# Patient Record
Sex: Female | Born: 1990 | Race: Black or African American | Hispanic: No | Marital: Single | Smoking: Never smoker
Health system: Southern US, Community
[De-identification: ages and names within clinical notes are randomized; demographics above are authoritative.]

## PROBLEM LIST (undated history)

## (undated) ENCOUNTER — Inpatient Hospital Stay (HOSPITAL_COMMUNITY): Payer: Self-pay

## (undated) DIAGNOSIS — R51 Headache: Secondary | ICD-10-CM

## (undated) DIAGNOSIS — R519 Headache, unspecified: Secondary | ICD-10-CM

## (undated) HISTORY — DX: Headache, unspecified: R51.9

## (undated) HISTORY — DX: Headache: R51

## (undated) HISTORY — PX: NO PAST SURGERIES: SHX2092

---
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
## 2008-05-01 ENCOUNTER — Emergency Department (HOSPITAL_COMMUNITY): Admission: EM | Admit: 2008-05-01 | Discharge: 2008-05-01 | Payer: Self-pay | Admitting: Emergency Medicine

## 2008-08-21 ENCOUNTER — Emergency Department (HOSPITAL_COMMUNITY): Admission: EM | Admit: 2008-08-21 | Discharge: 2008-08-22 | Payer: Self-pay | Admitting: Emergency Medicine

## 2008-08-27 ENCOUNTER — Emergency Department (HOSPITAL_COMMUNITY): Admission: EM | Admit: 2008-08-27 | Discharge: 2008-08-27 | Payer: Self-pay | Admitting: Emergency Medicine

## 2010-05-16 LAB — BASIC METABOLIC PANEL
BUN: 4 mg/dL — ABNORMAL LOW (ref 6–23)
Chloride: 104 mEq/L (ref 96–112)
Creatinine, Ser: 0.93 mg/dL (ref 0.4–1.2)
GFR calc Af Amer: >60 mL/min (ref >60)
Glucose, Bld: 96 mg/dL (ref 70–99)
Potassium: 3.5 mEq/L (ref 3.5–5.1)
Sodium: 134 mEq/L — ABNORMAL LOW (ref 135–145)

## 2010-05-16 LAB — DIFFERENTIAL
Basophils Relative: 0 % (ref 0–1)
Lymphocytes Relative: 5 % — ABNORMAL LOW (ref 12–46)
Monocytes Absolute: 0.9 10*3/uL (ref 0.1–1.0)
Monocytes Relative: 7 % (ref 3–12)
Neutrophils Relative %: 89 % — ABNORMAL HIGH (ref 43–77)

## 2010-05-16 LAB — WET PREP, GENITAL
Trich, Wet Prep: NONE SEEN
Yeast Wet Prep HPF POC: NONE SEEN

## 2010-05-16 LAB — URINALYSIS, ROUTINE W REFLEX MICROSCOPIC
Bilirubin Urine: NEGATIVE
Glucose, UA: NEGATIVE mg/dL
Nitrite: NEGATIVE
Specific Gravity, Urine: 1.021 (ref 1.005–1.030)
pH: 7 (ref 5.0–8.0)

## 2010-05-16 LAB — RAPID STREP SCREEN (MED CTR MEBANE ONLY): Streptococcus, Group A Screen (Direct): NEGATIVE

## 2010-05-16 LAB — CBC
HCT: 39.5 % (ref 36.0–46.0)
Hemoglobin: 13.6 g/dL (ref 12.0–15.0)
RDW: 12.6 % (ref 11.5–15.5)
WBC: 13.1 10*3/uL — ABNORMAL HIGH (ref 4.0–10.5)

## 2010-05-16 LAB — GC/CHLAMYDIA PROBE AMP, GENITAL
Chlamydia, DNA Probe: POSITIVE — AB
GC Probe Amp, Genital: POSITIVE — AB

## 2011-06-17 ENCOUNTER — Encounter (HOSPITAL_COMMUNITY): Payer: Self-pay | Admitting: *Deleted

## 2011-06-17 ENCOUNTER — Emergency Department (HOSPITAL_COMMUNITY)
Admission: EM | Admit: 2011-06-17 | Discharge: 2011-06-18 | Disposition: A | Discharge status: Home / Self Care | Payer: Medicaid Other | Attending: Emergency Medicine | Admitting: Emergency Medicine

## 2011-06-17 DIAGNOSIS — R300 Dysuria: Secondary | ICD-10-CM | POA: Insufficient documentation

## 2011-06-17 DIAGNOSIS — R109 Unspecified abdominal pain: Secondary | ICD-10-CM | POA: Insufficient documentation

## 2011-06-17 DIAGNOSIS — N760 Acute vaginitis: Secondary | ICD-10-CM | POA: Insufficient documentation

## 2011-06-17 DIAGNOSIS — A499 Bacterial infection, unspecified: Secondary | ICD-10-CM | POA: Insufficient documentation

## 2011-06-17 DIAGNOSIS — B9689 Other specified bacterial agents as the cause of diseases classified elsewhere: Secondary | ICD-10-CM | POA: Insufficient documentation

## 2011-06-17 NOTE — ED Notes (Signed)
C/o lower mid abd/pelvic cramping, also dysuria, onset 2d ago, (denies: back pain, fever, vd or bleeding), also reports vaginal itching and nausea. Alert, NAD, calm, interactive.

## 2011-06-18 LAB — CBC
HCT: 36.3 % (ref 36.0–46.0)
MCH: 32.2 pg (ref 26.0–34.0)
MCV: 91.2 fL (ref 78.0–100.0)
Platelets: 247 10*3/uL (ref 150–400)
RDW: 12.4 % (ref 11.5–15.5)
WBC: 8 10*3/uL (ref 4.0–10.5)

## 2011-06-18 LAB — DIFFERENTIAL
Basophils Relative: 1 % (ref 0–1)
Eosinophils Absolute: 0.1 10*3/uL (ref 0.0–0.7)
Eosinophils Relative: 1 % (ref 0–5)
Monocytes Absolute: 0.5 10*3/uL (ref 0.1–1.0)
Neutrophils Relative %: 46 % (ref 43–77)

## 2011-06-18 LAB — URINALYSIS, ROUTINE W REFLEX MICROSCOPIC
Bilirubin Urine: NEGATIVE
Nitrite: NEGATIVE

## 2011-06-18 LAB — POCT I-STAT, CHEM 8
Creatinine, Ser: 1 mg/dL (ref 0.50–1.10)
Potassium: 3.5 mEq/L (ref 3.5–5.1)
TCO2: 23 mmol/L (ref 0–100)

## 2011-06-18 LAB — WET PREP, GENITAL: Trich, Wet Prep: NONE SEEN

## 2011-06-18 MED ORDER — METRONIDAZOLE 500 MG PO TABS: 500.0000 mg | ORAL_TABLET | Freq: Two times a day (BID) | ORAL | Status: AC

## 2011-06-18 NOTE — Discharge Instructions (Signed)
Bacterial Vaginosis Bacterial vaginosis (BV) is a vaginal infection where the normal balance of bacteria in the vagina is disrupted. The normal balance is then replaced by an overgrowth of certain bacteria. There are several different kinds of bacteria that can cause BV. BV is the most common vaginal infection in women of childbearing age. CAUSES   The cause of BV is not fully understood. BV develops when there is an increase or imbalance of harmful bacteria.   Some activities or behaviors can upset the normal balance of bacteria in the vagina and put women at increased risk including:   Having a new sex partner or multiple sex partners.   Douching.   Using an intrauterine device (IUD) for contraception.   It is not clear what role sexual activity plays in the development of BV. However, women that have never had sexual intercourse are rarely infected with BV.  Women do not get BV from toilet seats, bedding, swimming pools or from touching objects around them.  SYMPTOMS   Grey vaginal discharge.   A fish-like odor with discharge, especially after sexual intercourse.   Itching or burning of the vagina and vulva.   Burning or pain with urination.   Some women have no signs or symptoms at all.  DIAGNOSIS  Your caregiver must examine the vagina for signs of BV. Your caregiver will perform lab tests and look at the sample of vaginal fluid through a microscope. They will look for bacteria and abnormal cells (clue cells), a pH test higher than 4.5, and a positive amine test all associated with BV.  RISKS AND COMPLICATIONS   Pelvic inflammatory disease (PID).   Infections following gynecology surgery.   Developing HIV.   Developing herpes virus.  TREATMENT  Sometimes BV will clear up without treatment. However, all women with symptoms of BV should be treated to avoid complications, especially if gynecology surgery is planned. Female partners generally do not need to be treated. However,  BV may spread between female sex partners so treatment is helpful in preventing a recurrence of BV.   BV may be treated with antibiotics. The antibiotics come in either pill or vaginal cream forms. Either can be used with nonpregnant or pregnant women, but the recommended dosages differ. These antibiotics are not harmful to the baby.   BV can recur after treatment. If this happens, a second round of antibiotics will often be prescribed.   Treatment is important for pregnant women. If not treated, BV can cause a premature delivery, especially for a pregnant woman who had a premature birth in the past. All pregnant women who have symptoms of BV should be checked and treated.   For chronic reoccurrence of BV, treatment with a type of prescribed gel vaginally twice a week is helpful.  HOME CARE INSTRUCTIONS   Finish all medication as directed by your caregiver.   Do not have sex until treatment is completed.   Tell your sexual partner that you have a vaginal infection. They should see their caregiver and be treated if they have problems, such as a mild rash or itching.   Practice safe sex. Use condoms. Only have 1 sex partner.  PREVENTION  Basic prevention steps can help reduce the risk of upsetting the natural balance of bacteria in the vagina and developing BV:  Do not have sexual intercourse (be abstinent).   Do not douche.   Use all of the medicine prescribed for treatment of BV, even if the signs and symptoms go away.     Tell your sex partner if you have BV. That way, they can be treated, if needed, to prevent reoccurrence.  SEEK MEDICAL CARE IF:   Your symptoms are not improving after 3 days of treatment.   You have increased discharge, pain, or fever.  MAKE SURE YOU:   Understand these instructions.   Will watch your condition.   Will get help right away if you are not doing well or get worse.  FOR MORE INFORMATION  Division of STD Prevention (DSTDP), Centers for Disease  Control and Prevention: www.cdc.gov/std American Social Health Association (ASHA): www.ashastd.org  Document Released: 01/24/2005 Document Revised: 01/13/2011 Document Reviewed: 07/17/2008 ExitCare Patient Information 2012 [employer]. 

## 2011-06-18 NOTE — ED Provider Notes (Signed)
History     CSN: [id number]  Arrival date & time 06/17/11  2247   First MD Initiated Contact with Patient 06/18/11 0131      Chief Complaint  Patient presents with  . Abdominal Cramping    (Consider location/radiation/quality/duration/timing/severity/associated sxs/prior treatment) Patient is a 21 y.o. female presenting with cramps and dysuria.  Abdominal Cramping The primary symptoms of the illness include dysuria and vaginal discharge. The primary symptoms of the illness do not include vaginal bleeding. The current episode started more than 2 days ago. The onset of the illness was gradual. The problem has not changed since onset. The dysuria is not associated with discharge or hematuria.  The vaginal discharge is associated with dysuria.   Associated with: none. The patient has not had a change in bowel habit. Risk factors: none. Symptoms associated with the illness do not include hematuria.  Dysuria  This is a new problem. The current episode started more than 2 days ago. The problem occurs every urination. The problem has not changed since onset.The quality of the pain is described as burning. There has been no fever. She is sexually active. There is no history of pyelonephritis. Pertinent negatives include no discharge, no hematuria and no flank pain. She has tried nothing for the symptoms. Her past medical history does not include catheterization.    History reviewed. No pertinent past medical history.  History reviewed. No pertinent past surgical history.  Family History  Problem Relation Age of Onset  . Diabetes Other     History  Substance Use Topics  . Smoking status: Never Smoker   . Smokeless tobacco: Not on file  . Alcohol Use: No    OB History    Grav Para Term Preterm Abortions TAB SAB Ect Mult Living                  Review of Systems  Genitourinary: Positive for dysuria and vaginal discharge. Negative for hematuria, flank pain and vaginal bleeding.    All other systems reviewed and are negative.    Allergies  Review of patient's allergies indicates no known allergies.  Home Medications  No current outpatient prescriptions on file.  BP 118/69  Pulse 78  Temp(Src) 98.6 F (37 C) (Oral)  Resp 18  SpO2 100%  LMP 02/03/2011  Physical Exam  Constitutional: She is oriented to person, place, and time. She appears well-developed and well-nourished.  HENT:  Head: Normocephalic and atraumatic.  Mouth/Throat: Oropharynx is clear and moist.  Eyes: Conjunctivae are normal. Pupils are equal, round, and reactive to light.  Neck: Normal range of motion. Neck supple.  Cardiovascular: Normal rate and regular rhythm.   Pulmonary/Chest: Effort normal and breath sounds normal. She has no wheezes. She has no rales.  Abdominal: Soft. Bowel sounds are normal. There is no tenderness. There is no rebound and no guarding.  Genitourinary: Vaginal discharge found.       Chaperone present  Musculoskeletal: Normal range of motion.  Neurological: She is alert and oriented to person, place, and time.  Skin: Skin is warm and dry.  Psychiatric: She has a normal mood and affect.    ED Course  Procedures (including critical care time)   Labs Reviewed  CBC  DIFFERENTIAL  POCT I-STAT, CHEM 8  POCT PREGNANCY, URINE  URINALYSIS, ROUTINE W REFLEX MICROSCOPIC  WET PREP, GENITAL  GC/CHLAMYDIA PROBE AMP, GENITAL   No results found.   No diagnosis found.    MDM  Will treat for BV  follow up with your family doctor for ongoing symptoms.  Patient verbalizes understanding and agrees to follow up       Jerran Tappan Smitty Cords, MD 06/18/11 [phone]

## 2011-06-20 LAB — GC/CHLAMYDIA PROBE AMP, GENITAL: Chlamydia, DNA Probe: NEGATIVE

## 2012-01-06 LAB — OB RESULTS CONSOLE HEPATITIS B SURFACE ANTIGEN: Hepatitis B Surface Ag: NEGATIVE

## 2012-01-06 LAB — OB RESULTS CONSOLE RUBELLA ANTIBODY, IGM: Rubella: IMMUNE

## 2012-01-06 LAB — OB RESULTS CONSOLE HGB/HCT, BLOOD: Hemoglobin: 12.9 g/dL

## 2012-01-06 LAB — OB RESULTS CONSOLE ANTIBODY SCREEN: Antibody Screen: NEGATIVE

## 2012-01-06 LAB — OB RESULTS CONSOLE HIV ANTIBODY (ROUTINE TESTING): HIV: NONREACTIVE

## 2012-01-06 LAB — OB RESULTS CONSOLE PLATELET COUNT: Platelets: 327 10*3/uL

## 2012-01-06 LAB — SICKLE CELL SCREEN: Sickle Cell Screen: NEGATIVE

## 2012-01-06 LAB — OB RESULTS CONSOLE GC/CHLAMYDIA: Gonorrhea: NEGATIVE

## 2012-02-08 NOTE — L&D Delivery Note (Signed)
Delivery Note At 12:43 AM a viable female was delivered via Vaginal, Spontaneous Delivery (Presentation: ; Occiput Anterior).  APGAR: 6, 4 .   Placenta status: Intact, Spontaneous.  Cord: 3 vessels with the following complications: none .  Episode of secondary apnea in setting of recent IM/IV narcotic analgesia.  Code Apgar called.  Infant stable on blow by O2.  Anesthesia:  None Episiotomy: None Lacerations: None Suture Repair: n/a Est. Blood Loss (mL): 100 ml  Mom to postpartum.  Baby to nursery-stable.  [name],Brittney Cybulski A 07/12/2012, 12:58 AM

## 2012-04-11 LAB — OB RESULTS CONSOLE RPR: RPR: NONREACTIVE

## 2012-04-22 ENCOUNTER — Encounter: Payer: Self-pay | Admitting: *Deleted

## 2012-04-25 ENCOUNTER — Encounter: Payer: Self-pay | Admitting: Obstetrics

## 2012-04-25 ENCOUNTER — Ambulatory Visit (INDEPENDENT_AMBULATORY_CARE_PROVIDER_SITE_OTHER): Payer: Medicaid Other | Admitting: Obstetrics

## 2012-04-25 VITALS — BP 120/74 | Temp 98.3°F | Wt 157.6 lb

## 2012-04-25 DIAGNOSIS — Z3482 Encounter for supervision of other normal pregnancy, second trimester: Secondary | ICD-10-CM

## 2012-04-25 DIAGNOSIS — Z3480 Encounter for supervision of other normal pregnancy, unspecified trimester: Secondary | ICD-10-CM

## 2012-04-25 LAB — POCT URINALYSIS DIPSTICK
Blood, UA: NEGATIVE
Leukocytes, UA: NEGATIVE
Nitrite, UA: NEGATIVE
Protein, UA: NEGATIVE
Urobilinogen, UA: NEGATIVE
pH, UA: 7

## 2012-04-25 NOTE — Addendum Note (Signed)
Addended by: Glendell Docker on: 04/25/2012 05:07 PM   Modules accepted: Orders

## 2012-05-14 ENCOUNTER — Encounter: Payer: Medicaid Other | Admitting: Obstetrics

## 2012-05-14 ENCOUNTER — Ambulatory Visit (INDEPENDENT_AMBULATORY_CARE_PROVIDER_SITE_OTHER): Payer: Medicaid Other | Admitting: Obstetrics

## 2012-05-14 ENCOUNTER — Encounter: Payer: Self-pay | Admitting: Obstetrics

## 2012-05-14 VITALS — BP 105/70 | Temp 98.1°F | Wt 165.0 lb

## 2012-05-14 DIAGNOSIS — Z3480 Encounter for supervision of other normal pregnancy, unspecified trimester: Secondary | ICD-10-CM

## 2012-05-14 DIAGNOSIS — O365990 Maternal care for other known or suspected poor fetal growth, unspecified trimester, not applicable or unspecified: Secondary | ICD-10-CM

## 2012-05-14 DIAGNOSIS — O365931 Maternal care for other known or suspected poor fetal growth, third trimester, fetus 1: Secondary | ICD-10-CM

## 2012-05-14 DIAGNOSIS — Z3483 Encounter for supervision of other normal pregnancy, third trimester: Secondary | ICD-10-CM

## 2012-05-14 NOTE — Progress Notes (Signed)
Pt states she is having some pelvic pressure, pelvic cramping and lower back pain Pulse-94

## 2012-05-14 NOTE — Progress Notes (Signed)
No complaints. Doing well.

## 2012-05-30 ENCOUNTER — Ambulatory Visit (INDEPENDENT_AMBULATORY_CARE_PROVIDER_SITE_OTHER): Payer: Medicaid Other | Admitting: Obstetrics

## 2012-05-30 ENCOUNTER — Encounter: Payer: Self-pay | Admitting: Obstetrics

## 2012-05-30 VITALS — BP 129/81 | Temp 98.6°F | Wt 171.0 lb

## 2012-05-30 DIAGNOSIS — O365931 Maternal care for other known or suspected poor fetal growth, third trimester, fetus 1: Secondary | ICD-10-CM

## 2012-05-30 DIAGNOSIS — Z3483 Encounter for supervision of other normal pregnancy, third trimester: Secondary | ICD-10-CM

## 2012-05-30 DIAGNOSIS — O365990 Maternal care for other known or suspected poor fetal growth, unspecified trimester, not applicable or unspecified: Secondary | ICD-10-CM

## 2012-05-30 DIAGNOSIS — Z3480 Encounter for supervision of other normal pregnancy, unspecified trimester: Secondary | ICD-10-CM

## 2012-05-30 LAB — POCT URINALYSIS DIPSTICK
Blood, UA: NEGATIVE
Nitrite, UA: NEGATIVE
Protein, UA: NEGATIVE
Spec Grav, UA: 1.02
Urobilinogen, UA: NEGATIVE
pH, UA: 6

## 2012-05-30 NOTE — Progress Notes (Signed)
P 106 Patient reports increasing pressure in pelvis. Patient states she has swelling in her feet that comes and goes daily. Increasing fatigue and SOB.

## 2012-06-06 ENCOUNTER — Ambulatory Visit (INDEPENDENT_AMBULATORY_CARE_PROVIDER_SITE_OTHER): Payer: Medicaid Other

## 2012-06-06 ENCOUNTER — Other Ambulatory Visit: Payer: Self-pay | Admitting: Obstetrics

## 2012-06-06 DIAGNOSIS — O365931 Maternal care for other known or suspected poor fetal growth, third trimester, fetus 1: Secondary | ICD-10-CM

## 2012-06-06 DIAGNOSIS — O365990 Maternal care for other known or suspected poor fetal growth, unspecified trimester, not applicable or unspecified: Secondary | ICD-10-CM

## 2012-06-06 LAB — US OB DETAIL + 14 WK

## 2012-06-07 ENCOUNTER — Encounter: Payer: Self-pay | Admitting: Obstetrics

## 2012-06-08 ENCOUNTER — Encounter: Payer: Self-pay | Admitting: Obstetrics

## 2012-06-08 LAB — US OB DETAIL + 14 WK

## 2012-06-14 ENCOUNTER — Ambulatory Visit (INDEPENDENT_AMBULATORY_CARE_PROVIDER_SITE_OTHER): Payer: Medicaid Other | Admitting: Obstetrics

## 2012-06-14 ENCOUNTER — Encounter: Payer: Self-pay | Admitting: Obstetrics

## 2012-06-14 VITALS — BP 123/77 | Temp 97.9°F | Wt 178.0 lb

## 2012-06-14 DIAGNOSIS — Z3483 Encounter for supervision of other normal pregnancy, third trimester: Secondary | ICD-10-CM

## 2012-06-14 DIAGNOSIS — Z3480 Encounter for supervision of other normal pregnancy, unspecified trimester: Secondary | ICD-10-CM

## 2012-06-14 LAB — POCT URINALYSIS DIPSTICK
Bilirubin, UA: NEGATIVE
Ketones, UA: NEGATIVE
Protein, UA: NEGATIVE
Spec Grav, UA: 1.015
pH, UA: 6

## 2012-06-14 NOTE — Progress Notes (Signed)
Pulse: 104

## 2012-06-25 ENCOUNTER — Encounter: Payer: Self-pay | Admitting: Obstetrics

## 2012-06-28 ENCOUNTER — Ambulatory Visit (INDEPENDENT_AMBULATORY_CARE_PROVIDER_SITE_OTHER): Payer: Medicaid Other | Admitting: Obstetrics

## 2012-06-28 VITALS — BP 113/77 | Temp 98.3°F | Wt 185.0 lb

## 2012-06-28 DIAGNOSIS — Z3480 Encounter for supervision of other normal pregnancy, unspecified trimester: Secondary | ICD-10-CM

## 2012-06-28 DIAGNOSIS — Z3483 Encounter for supervision of other normal pregnancy, third trimester: Secondary | ICD-10-CM

## 2012-06-28 LAB — POCT URINALYSIS DIPSTICK
Bilirubin, UA: NEGATIVE
Blood, UA: NEGATIVE
Glucose, UA: NEGATIVE
Ketones, UA: NEGATIVE
Spec Grav, UA: 1.015
pH, UA: 7

## 2012-06-28 NOTE — Progress Notes (Signed)
Pulse- 94 Pt states she is having increased pelvic pressure.  

## 2012-06-29 ENCOUNTER — Encounter: Payer: Self-pay | Admitting: Obstetrics

## 2012-07-06 ENCOUNTER — Ambulatory Visit (INDEPENDENT_AMBULATORY_CARE_PROVIDER_SITE_OTHER): Payer: Medicaid Other | Admitting: Obstetrics

## 2012-07-06 ENCOUNTER — Encounter: Payer: Self-pay | Admitting: Obstetrics

## 2012-07-06 VITALS — BP 117/77 | Temp 98.4°F | Wt 186.2 lb

## 2012-07-06 DIAGNOSIS — Z3480 Encounter for supervision of other normal pregnancy, unspecified trimester: Secondary | ICD-10-CM

## 2012-07-06 DIAGNOSIS — Z3483 Encounter for supervision of other normal pregnancy, third trimester: Secondary | ICD-10-CM

## 2012-07-06 LAB — POCT URINALYSIS DIPSTICK
Bilirubin, UA: NEGATIVE
Glucose, UA: NEGATIVE
Ketones, UA: NEGATIVE
Leukocytes, UA: NEGATIVE
Spec Grav, UA: 1.025

## 2012-07-06 NOTE — Progress Notes (Signed)
Pulse: 103

## 2012-07-07 ENCOUNTER — Encounter: Payer: Self-pay | Admitting: Obstetrics

## 2012-07-11 ENCOUNTER — Encounter (HOSPITAL_COMMUNITY): Payer: Self-pay | Admitting: *Deleted

## 2012-07-11 ENCOUNTER — Encounter (HOSPITAL_COMMUNITY): Payer: Self-pay | Admitting: Family

## 2012-07-11 ENCOUNTER — Inpatient Hospital Stay (HOSPITAL_COMMUNITY)
Admission: AD | Admit: 2012-07-11 | Discharge: 2012-07-11 | Disposition: A | Discharge status: Home / Self Care | Payer: Medicaid Other | Source: Ambulatory Visit | Attending: Obstetrics & Gynecology | Admitting: Obstetrics & Gynecology

## 2012-07-11 ENCOUNTER — Inpatient Hospital Stay (HOSPITAL_COMMUNITY)
Admission: AD | Admit: 2012-07-11 | Discharge: 2012-07-14 | DRG: 775 | Disposition: A | Discharge status: Home / Self Care | Payer: Medicaid Other | Source: Ambulatory Visit | Attending: Obstetrics & Gynecology | Admitting: Obstetrics & Gynecology

## 2012-07-11 DIAGNOSIS — O4700 False labor before 37 completed weeks of gestation, unspecified trimester: Secondary | ICD-10-CM | POA: Insufficient documentation

## 2012-07-11 DIAGNOSIS — IMO0001 Reserved for inherently not codable concepts without codable children: Secondary | ICD-10-CM

## 2012-07-11 DIAGNOSIS — O4690 Antepartum hemorrhage, unspecified, unspecified trimester: Secondary | ICD-10-CM | POA: Insufficient documentation

## 2012-07-11 LAB — CBC
Hemoglobin: 12.7 g/dL (ref 12.0–15.0)
MCH: 30.1 pg (ref 26.0–34.0)
MCHC: 34.2 g/dL (ref 30.0–36.0)
Platelets: 248 10*3/uL (ref 150–400)
RDW: 13.9 % (ref 11.5–15.5)

## 2012-07-11 MED ORDER — NALBUPHINE SYRINGE 5 MG/0.5 ML
10.0000 mg | INJECTION | Freq: Once | INTRAMUSCULAR | Status: AC
  Administered 2012-07-11: 10 mg via INTRAVENOUS
  Filled 2012-07-11: qty 0.5

## 2012-07-11 MED ORDER — LACTATED RINGERS IV SOLN: INTRAVENOUS | Status: DC

## 2012-07-11 MED ORDER — CITRIC ACID-SODIUM CITRATE 334-500 MG/5ML PO SOLN: 30.0000 mL | ORAL | Status: DC | PRN

## 2012-07-11 MED ORDER — FLEET ENEMA 7-19 GM/118ML RE ENEM: 1.0000 | ENEMA | RECTAL | Status: DC | PRN

## 2012-07-11 MED ORDER — LACTATED RINGERS IV SOLN: 500.0000 mL | INTRAVENOUS | Status: DC | PRN

## 2012-07-11 MED ORDER — OXYTOCIN BOLUS FROM INFUSION
500.0000 mL | INTRAVENOUS | Status: DC
  Administered 2012-07-12: 500 mL via INTRAVENOUS

## 2012-07-11 MED ORDER — ONDANSETRON HCL 4 MG/2ML IJ SOLN: 4.0000 mg | Freq: Four times a day (QID) | INTRAMUSCULAR | Status: DC | PRN

## 2012-07-11 MED ORDER — OXYCODONE-ACETAMINOPHEN 5-325 MG PO TABS: 1.0000 | ORAL_TABLET | ORAL | Status: DC | PRN

## 2012-07-11 MED ORDER — NALBUPHINE SYRINGE 5 MG/0.5 ML
10.0000 mg | INJECTION | Freq: Once | INTRAMUSCULAR | Status: AC
  Administered 2012-07-11: 10 mg via INTRAMUSCULAR
  Filled 2012-07-11: qty 0.5
  Filled 2012-07-11: qty 1

## 2012-07-11 MED ORDER — PROMETHAZINE HCL 25 MG/ML IJ SOLN
25.0000 mg | Freq: Once | INTRAMUSCULAR | Status: AC
  Administered 2012-07-11: 25 mg via INTRAMUSCULAR
  Filled 2012-07-11: qty 1

## 2012-07-11 MED ORDER — OXYTOCIN 40 UNITS IN LACTATED RINGERS INFUSION - SIMPLE MED
62.5000 mL/h | INTRAVENOUS | Status: DC
  Administered 2012-07-12: 62.5 mL/h via INTRAVENOUS
  Filled 2012-07-11: qty 1000

## 2012-07-11 MED ORDER — ACETAMINOPHEN 325 MG PO TABS: 650.0000 mg | ORAL_TABLET | ORAL | Status: DC | PRN

## 2012-07-11 MED ORDER — BUTORPHANOL TARTRATE 1 MG/ML IJ SOLN: 2.0000 mg | INTRAMUSCULAR | Status: DC | PRN

## 2012-07-11 MED ORDER — IBUPROFEN 600 MG PO TABS
600.0000 mg | ORAL_TABLET | Freq: Four times a day (QID) | ORAL | Status: DC | PRN
  Administered 2012-07-12: 600 mg via ORAL
  Filled 2012-07-11: qty 1

## 2012-07-11 MED ORDER — LIDOCAINE HCL (PF) 1 % IJ SOLN
30.0000 mL | INTRAMUSCULAR | Status: DC | PRN
  Filled 2012-07-11 (×2): qty 30

## 2012-07-11 NOTE — MAU Note (Signed)
Patient presents to MAU with c/o vaginal bleeding noted in toilet and wiping today at 1200. Denies LOF. Reports +FM.  Denies HSV or MRSA.

## 2012-07-11 NOTE — H&P (Signed)
Tuwanda Vokes is a 22 y.o. female presenting for contraction. Maternal Medical History:  Reason for admission: Contractions and nausea.   Contractions: Onset was 6-12 hours ago.   Frequency: regular.   Perceived severity is strong.    Fetal activity: Perceived fetal activity is normal.    Prenatal complications: no prenatal complications Prenatal Complications - Diabetes: none.    OB History   Grav Para Term Preterm Abortions TAB SAB Ect Mult Living   2 1  1      1      Past Medical History  Diagnosis Date  . Medical history non-contributory    Past Surgical History  Procedure Laterality Date  . No past surgeries     Family History: family history includes Diabetes in her other. Social History:  reports that she has never smoked. She has never used smokeless tobacco. She reports that she does not drink alcohol or use illicit drugs.   Review of Systems  Constitutional: Negative for fever.  Eyes: Negative for blurred vision.  Respiratory: Negative for shortness of breath.   Gastrointestinal: Positive for nausea. Negative for vomiting.  Skin: Negative for rash.  Neurological: Negative for headaches.    Dilation: 4 Effacement (%): 90 Station: +1 Exam by:: jolynn Blood pressure 136/86, pulse 106, temperature 99 F (37.2 C), temperature source Oral, resp. rate 18, height 5\' 2"  (1.575 m), weight 186 lb (84.369 kg), last menstrual period 10/27/2011. Maternal Exam:  Uterine Assessment: Contraction frequency is regular.   Abdomen: not evaluated.  Introitus: not evaluated.   Cervix: Cervix evaluated by digital exam.     Fetal Exam Fetal Monitor Review: Variability: moderate (6-25 bpm).   Pattern: accelerations present and no decelerations.    Fetal State Assessment: Category I - tracings are normal.     Physical Exam  Constitutional: She appears well-developed.  HENT:  Head: Normocephalic.  Neck: Neck supple. No thyromegaly present.  Cardiovascular: Normal  rate and regular rhythm.   Respiratory: Breath sounds normal.  GI: Soft. Bowel sounds are normal.  Skin: No rash noted.    Prenatal labs: ABO, Rh: O/Positive/-- (11/29 0000) Antibody: Negative (11/29 0000) Rubella: Immune (11/29 0000) RPR: Nonreactive (03/05 0000)  HBsAg: Negative (11/29 0000)  HIV: Non-reactive (03/05 0000)  GBS: NEGATIVE (05/22 1145)   Assessment/Plan: Primipara @ [redacted]w[redacted]d.  Late latent vs early active labor. Category I FHT GBS negative  Admit Expectant management   JACKSON-MOORE,Jazaria Jarecki A 07/11/2012, 8:31 PM

## 2012-07-12 ENCOUNTER — Encounter (HOSPITAL_COMMUNITY): Payer: Self-pay | Admitting: *Deleted

## 2012-07-12 LAB — RPR: RPR Ser Ql: NONREACTIVE

## 2012-07-12 MED ORDER — NALOXONE HCL 0.4 MG/ML IJ SOLN
INTRAMUSCULAR | Status: AC
  Administered 2012-07-12: 0.4 mg
  Filled 2012-07-12: qty 1

## 2012-07-12 MED ORDER — ZOLPIDEM TARTRATE 5 MG PO TABS: 5.0000 mg | ORAL_TABLET | Freq: Every evening | ORAL | Status: DC | PRN

## 2012-07-12 MED ORDER — ONDANSETRON HCL 4 MG/2ML IJ SOLN: 4.0000 mg | INTRAMUSCULAR | Status: DC | PRN

## 2012-07-12 MED ORDER — BENZOCAINE-MENTHOL 20-0.5 % EX AERO: 1.0000 "application " | INHALATION_SPRAY | CUTANEOUS | Status: DC | PRN

## 2012-07-12 MED ORDER — MAGNESIUM HYDROXIDE 400 MG/5ML PO SUSP: 30.0000 mL | ORAL | Status: DC | PRN

## 2012-07-12 MED ORDER — PRENATAL MULTIVITAMIN CH
1.0000 | ORAL_TABLET | Freq: Every day | ORAL | Status: DC
  Administered 2012-07-12 – 2012-07-14 (×3): 1 via ORAL
  Filled 2012-07-12 (×3): qty 1

## 2012-07-12 MED ORDER — IBUPROFEN 600 MG PO TABS
600.0000 mg | ORAL_TABLET | Freq: Four times a day (QID) | ORAL | Status: DC
  Administered 2012-07-12 – 2012-07-14 (×7): 600 mg via ORAL
  Filled 2012-07-12 (×10): qty 1

## 2012-07-12 MED ORDER — FERROUS SULFATE 325 (65 FE) MG PO TABS
325.0000 mg | ORAL_TABLET | Freq: Two times a day (BID) | ORAL | Status: DC
  Administered 2012-07-12 – 2012-07-14 (×5): 325 mg via ORAL
  Filled 2012-07-12 (×5): qty 1

## 2012-07-12 MED ORDER — DIPHENHYDRAMINE HCL 25 MG PO CAPS: 25.0000 mg | ORAL_CAPSULE | Freq: Four times a day (QID) | ORAL | Status: DC | PRN

## 2012-07-12 MED ORDER — SENNOSIDES-DOCUSATE SODIUM 8.6-50 MG PO TABS
2.0000 | ORAL_TABLET | Freq: Every day | ORAL | Status: DC
  Administered 2012-07-12 – 2012-07-14 (×3): 2 via ORAL

## 2012-07-12 MED ORDER — DIBUCAINE 1 % RE OINT
1.0000 "application " | TOPICAL_OINTMENT | RECTAL | Status: DC | PRN
  Administered 2012-07-13: 1 via RECTAL
  Filled 2012-07-12: qty 28

## 2012-07-12 MED ORDER — WITCH HAZEL-GLYCERIN EX PADS: 1.0000 "application " | MEDICATED_PAD | CUTANEOUS | Status: DC | PRN

## 2012-07-12 MED ORDER — TETANUS-DIPHTH-ACELL PERTUSSIS 5-2.5-18.5 LF-MCG/0.5 IM SUSP
0.5000 mL | Freq: Once | INTRAMUSCULAR | Status: AC
  Administered 2012-07-13: 0.5 mL via INTRAMUSCULAR

## 2012-07-12 MED ORDER — MEASLES, MUMPS & RUBELLA VAC ~~LOC~~ INJ
0.5000 mL | INJECTION | Freq: Once | SUBCUTANEOUS | Status: DC
  Filled 2012-07-12: qty 0.5

## 2012-07-12 MED ORDER — ONDANSETRON HCL 4 MG PO TABS: 4.0000 mg | ORAL_TABLET | ORAL | Status: DC | PRN

## 2012-07-12 MED ORDER — OXYCODONE-ACETAMINOPHEN 5-325 MG PO TABS
1.0000 | ORAL_TABLET | ORAL | Status: DC | PRN
  Administered 2012-07-12 – 2012-07-14 (×4): 1 via ORAL
  Filled 2012-07-12 (×5): qty 1

## 2012-07-12 MED ORDER — LANOLIN HYDROUS EX OINT: TOPICAL_OINTMENT | CUTANEOUS | Status: DC | PRN

## 2012-07-12 NOTE — Progress Notes (Signed)
Patient ID: Brittney [name], female   DOB: [birth year], 22 y.o.   MRN: [id number] Post Partum Day 0 S/P spontaneous vaginal RH status/Rubella reviewed.  Feeding: bottle Subjective: No HA, SOB, CP, F/C, breast symptoms. Normal vaginal bleeding, no clots.     Objective: BP 97/62  Pulse 94  Temp(Src) 98.8 F (37.1 C) (Oral)  Resp 16  Ht 5\' 2"  (1.575 m)  Wt 186 lb (84.369 kg)  BMI 34.01 kg/m2  SpO2 96%  LMP 10/27/2011   Physical Exam:  General: alert Lochia: appropriate Uterine Fundus: firm DVT Evaluation: No evidence of DVT seen on physical exam. Ext: No c/c/e  Recent Labs  07/11/12 1830  HGB 12.7  HCT 37.1      Assessment/Plan: 22 y.o.  PPD #0 .  normal postpartum exam Continue current postpartum care Ambulate   LOS: 1 day   JACKSON-MOORE,Esmerelda Finnigan A 07/12/2012, 8:47 AM

## 2012-07-12 NOTE — Progress Notes (Signed)
UR chart review completed.  

## 2012-07-13 ENCOUNTER — Encounter (HOSPITAL_COMMUNITY): Payer: Self-pay | Admitting: *Deleted

## 2012-07-13 NOTE — Progress Notes (Signed)
Post Partum Day 1 Subjective: no complaints  Objective: Blood pressure 107/71, pulse 88, temperature 97.5 F (36.4 C), temperature source Oral, resp. rate 18, height 5\' 2"  (1.575 m), weight 186 lb (84.369 kg), last menstrual period 10/27/2011, SpO2 96.00%, unknown if currently breastfeeding.  Physical Exam:  General: alert and no distress Lochia: appropriate Uterine Fundus: firm Incision: healing well DVT Evaluation: No evidence of DVT seen on physical exam.   Recent Labs  07/11/12 1830  HGB 12.7  HCT 37.1    Assessment/Plan: Plan for discharge tomorrow   LOS: 2 days   Brittney [name] A 07/13/2012, 7:00 AM

## 2012-07-13 NOTE — Clinical Social Work Maternal (Signed)
    Clinical Social Work Department PSYCHOSOCIAL ASSESSMENT - MATERNAL/CHILD 07/13/2012  Patient:  Brittney [name], Brittney [name]  Account Number:  [id number]  Admit Date:  07/11/2012  Marjo Bicker Name:   Earvin Hansen    Clinical Social Worker:  Lulu Riding, LCSW   Date/Time:  07/13/2012 01:00 PM  Date Referred:  07/13/2012   Referral source  NICU     Referred reason  NICU   Other referral source:    I:  FAMILY / HOME ENVIRONMENT Child's legal guardian:  PARENT  Guardian - Name Guardian - Age Guardian - Address  Kristina Mcnorton [address], [place], [place] [address]  Suzan Garibaldi  same   Other household support members/support persons Name Relationship DOB  Sheria Lang SON 6   Other support:   FOB reports that they have a great support system.    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 social research officer / Statistician / Early Interventions:  Cultural issues impacting care:   None identified.    III  STRENGTHS Strengths  Adequate Resources  Compliance with medical plan  Home prepared for Child (including basic supplies)  Other - See comment  Supportive family/friends   Strength comment:  Pediatric follow up will [facility]   IV  RISK FACTORS AND CURRENT PROBLEMS Current Problem:  None   Risk Factor & Current Problem Patient Issue Family Issue Risk Factor / Current Problem Comment   N N     V  SOCIAL WORK ASSESSMENT  CSW met with parents in MOB's first floor room/112 to introduce myself and complete assessment.  Parents were pleasant and stated this was a good time to talk.  They seem highly anxious about baby being in the NICU,  especially FOB.  He states it is difficult to talk about the situation because it is upsetting to him.  MOB states she understands that there isn't anything they can do about it and that baby needs the care he is receiving.  FOB states he would like more information about baby's medical situation.  CSW notified NNP/J. Grayer requesting that she or a Neo meet with family to give them a thourough update on baby.  Parents state they will not have any trouble getting to the hospital if MOB is discharged prior to baby, but that they share one car.  FOB reports that they have a wonderful support system of family and friends in the area and everything they need for baby at home.  CSW explained on going support services offered by NICU CSW and gave contact information.    VI SOCIAL WORK PLAN Social Work Plan  Psychosocial Support/Ongoing Assessment of Needs   Type of pt/family education:   Ongoing support services from NICU CSW   If child protective services report - county:   If child protective services report - date:   Information/referral to community resources comment:   No referral needs identified at this time.   Other social work plan:

## 2012-07-14 MED ORDER — OXYCODONE-ACETAMINOPHEN 5-325 MG PO TABS: 1.0000 | ORAL_TABLET | ORAL | Status: DC | PRN

## 2012-07-14 NOTE — Discharge Summary (Signed)
  Obstetric Discharge Summary Reason for Admission: onset of labor Prenatal Procedures: none Intrapartum Procedures: spontaneous vaginal delivery Postpartum Procedures: none Complications-Operative and Postpartum: none  Hemoglobin  Date Value Range Status  07/11/2012 12.7  12.0 - 15.0 g/dL Final  02/12/1094 04.5   Final     HCT  Date Value Range Status  07/11/2012 37.1  36.0 - 46.0 % Final  04/11/2012 36   Final    Physical Exam:  General: alert Lochia: appropriate Uterine: firm Incision: n/a DVT Evaluation: No evidence of DVT seen on physical exam.  Discharge Diagnoses: Active Problems:   Active labor   Normal delivery   Discharge Information: Date: 07/14/2012 Activity: pelvic rest Diet: routine Medications:  Prior to Admission medications   Medication Sig Start Date End Date Taking? Authorizing Provider  Prenatal Vit-Fe Fumarate-FA (PRENATAL MULTIVITAMIN) TABS Take 1 tablet by mouth daily.    Yes Historical Provider, MD  oxyCODONE-acetaminophen (PERCOCET/ROXICET) 5-325 MG per tablet Take 1-2 tablets by mouth every 4 (four) hours as needed. 07/14/12   Antionette Char, MD    Condition: stable Instructions: refer to routine discharge instructions Discharge to: home Follow-up Information   Follow up with HARPER,CHARLES A, MD. Schedule an appointment as soon as possible for a visit in 2 weeks.   Contact information:   [address] [place] [address] [phone]       Newborn Data: Live born  Information for the patient's newborn:  Miakoda, Mcmillion [[id number]]  female ; APGAR (1 MIN): 6   APGAR (5 MINS): 4   APGAR (10 MINS): 7    Home with mother.  JACKSON-MOORE,Cheila Wickstrom A 07/14/2012, 10:09 AM

## 2012-07-16 ENCOUNTER — Encounter: Payer: Medicaid Other | Admitting: Obstetrics

## 2012-07-17 ENCOUNTER — Telehealth: Payer: Self-pay | Admitting: *Deleted

## 2012-07-17 NOTE — Telephone Encounter (Signed)
Patient called and left voice message stating she delivered on 07/12/2012 and she is now having swelling in her right knee and ankle. She is requesting a return phone call.  Call returned to patient  [phone] she states the problem is her  right leg onset 07/13/2012, elevation helps with pain, but does not relieve the swelling. She states she is up a and down making bottles for her baby and that causes her knee to go numb. She states it does not hurt to raise leg, there is no increased pain with walking or standing, she denies having a fever. Patient states she does have a history of diabetes and hypertension, but has not had any problems with either one. Please advise

## 2012-08-07 NOTE — Telephone Encounter (Signed)
Patient was no show for 07/16/12 office visit, no return phone call from patient regarding swelling. Patient is scheduled for 08/21/2012 for office visit. No further action is required.

## 2012-08-16 ENCOUNTER — Encounter (HOSPITAL_COMMUNITY): Payer: Self-pay | Admitting: Emergency Medicine

## 2012-08-16 ENCOUNTER — Emergency Department (HOSPITAL_COMMUNITY)
Admission: EM | Admit: 2012-08-16 | Discharge: 2012-08-16 | Disposition: A | Discharge status: Home / Self Care | Payer: Medicaid Other | Source: Home / Self Care

## 2012-08-16 ENCOUNTER — Other Ambulatory Visit (HOSPITAL_COMMUNITY)
Admission: RE | Admit: 2012-08-16 | Discharge: 2012-08-16 | Disposition: A | Discharge status: Home / Self Care | Payer: Medicaid Other | Source: Ambulatory Visit | Attending: Family Medicine | Admitting: Family Medicine

## 2012-08-16 DIAGNOSIS — Z113 Encounter for screening for infections with a predominantly sexual mode of transmission: Secondary | ICD-10-CM | POA: Insufficient documentation

## 2012-08-16 DIAGNOSIS — N760 Acute vaginitis: Secondary | ICD-10-CM | POA: Insufficient documentation

## 2012-08-16 LAB — POCT URINALYSIS DIP (DEVICE)
Bilirubin Urine: NEGATIVE
Glucose, UA: NEGATIVE mg/dL
Nitrite: NEGATIVE

## 2012-08-16 LAB — POCT PREGNANCY, URINE: Preg Test, Ur: NEGATIVE

## 2012-08-16 MED ORDER — FLUCONAZOLE 150 MG PO TABS: 150.0000 mg | ORAL_TABLET | Freq: Once | ORAL | Status: DC

## 2012-08-16 MED ORDER — METRONIDAZOLE 500 MG PO TABS: 500.0000 mg | ORAL_TABLET | Freq: Two times a day (BID) | ORAL | Status: DC

## 2012-08-16 NOTE — ED Provider Notes (Signed)
History    CSN: [id number] Arrival date & time 08/16/12  1058  First MD Initiated Contact with Patient 08/16/12 1324     Chief Complaint  Patient presents with  . Vaginal Discharge   (Consider location/radiation/quality/duration/timing/severity/associated sxs/prior Treatment) HPI  22 yo bf presents with the above complaint.  Vaginal itchin and White discharge ongoing x 2weeks Child birth 4 weeks ago.  Says that it has been difficult getting appt with obgyn.  Denies fever, chills, nausea, hematuria, abd pain,  Vaginal bleeding.  States that she has had BV in the past and current symptoms are similar.  Not currently sexually active since giving birth.   Past Medical History  Diagnosis Date  . Medical history non-contributory    Past Surgical History  Procedure Laterality Date  . No past surgeries     Family History  Problem Relation Age of Onset  . Diabetes Other    History  Substance Use Topics  . Smoking status: Never Smoker   . Smokeless tobacco: Never Used  . Alcohol Use: No   OB History   Grav Para Term Preterm Abortions TAB SAB Ect Mult Living   2 2 1 1      2      Review of Systems  Constitutional: Negative.   HENT: Negative.   Eyes: Negative.   Respiratory: Negative.   Cardiovascular: Negative.   Gastrointestinal: Negative.   Endocrine: Negative.   Genitourinary: Positive for vaginal discharge and genital sores. Negative for dysuria, urgency, frequency, hematuria, flank pain, vaginal bleeding and pelvic pain.  Skin: Negative.   Neurological: Negative.   Psychiatric/Behavioral: Negative.     Allergies  Review of patient's allergies indicates no known allergies.  Home Medications   Current Outpatient Rx  Name  Route  Sig  Dispense  Refill  . fluconazole (DIFLUCAN) 150 MG tablet   Oral   Take 1 tablet (150 mg total) by mouth once.   2 tablet   0     Take one tablet today and the other in 72 hours if ...   . metroNIDAZOLE (FLAGYL) 500 MG tablet  Oral   Take 1 tablet (500 mg total) by mouth 2 (two) times daily.   14 tablet   0   . oxyCODONE-acetaminophen (PERCOCET/ROXICET) 5-325 MG per tablet   Oral   Take 1-2 tablets by mouth every 4 (four) hours as needed.   30 tablet   0   . Prenatal Vit-Fe Fumarate-FA (PRENATAL MULTIVITAMIN) TABS   Oral   Take 1 tablet by mouth daily.           BP 123/75  Pulse 73  Temp(Src) 98.9 F (37.2 C) (Oral)  Resp 20  SpO2 99%  Breastfeeding? No Physical Exam  Constitutional: She is oriented to person, place, and time. She appears well-developed and well-nourished.  Eyes: EOM are normal. Pupils are equal, round, and reactive to light.  Neck: Normal range of motion.  Cardiovascular: Normal rate and regular rhythm.   Pulmonary/Chest: Effort normal and breath sounds normal.  Abdominal: Soft. Bowel sounds are normal.  Genitourinary: Vaginal discharge found.  Musculoskeletal: Normal range of motion.  Neurological: She is alert and oriented to person, place, and time.  Skin: Skin is warm and dry.  Psychiatric: She has a normal mood and affect.    ED Course  Pelvic exam Date/Time: 08/16/2012 2:11 PM Performed by: Zonia Kief Authorized by: Zonia Kief Consent: Verbal consent obtained. Risks and benefits: risks, benefits and alternatives were discussed Consent  given by: patient Patient understanding: patient states understanding of the procedure being performed Patient consent: the patient's understanding of the procedure matches consent given Procedure consent: procedure consent matches procedure scheduled Relevant documents: relevant documents present and verified Test results: test results available and properly labeled Site marked: the operative site was marked Imaging studies: imaging studies not available Required items: required blood products, implants, devices, and special equipment available Patient identity confirmed: verbally with patient and arm band Time out:  Immediately prior to procedure a "time out" was called to verify the correct patient, procedure, equipment, support staff and site/side marked as required. Preparation: Patient was prepped and draped in the usual sterile fashion. Local anesthesia used: no Patient sedated: no Patient tolerance: Patient tolerated the procedure well with no immediate complications. Comments: White vaginal discharge.  Cultures sent.     (including critical care time) Labs Reviewed  POCT URINALYSIS DIP (DEVICE) - Abnormal; Notable for the following:    Leukocytes, UA TRACE (*)    All other components within normal limits  POCT PREGNANCY, URINE  CERVICOVAGINAL ANCILLARY ONLY   No results found. 1. Vaginitis     MDM  Scripts given.  Has appt with dr Clearance Coots obgyn next Tuesday and she was encouraged to keep appt.  Return to urgent care or go to ED if symptoms worsen.    Meds ordered this encounter  Medications  . fluconazole (DIFLUCAN) 150 MG tablet    Sig: Take 1 tablet (150 mg total) by mouth once.    Dispense:  2 tablet    Refill:  0    Take one tablet today and the other in 72 hours if itching continues.  . metroNIDAZOLE (FLAGYL) 500 MG tablet    Sig: Take 1 tablet (500 mg total) by mouth 2 (two) times daily.    Dispense:  14 tablet    Refill:  0    Zonia Kief, PA-C 08/16/12 1422

## 2012-08-16 NOTE — ED Notes (Signed)
Pt c/o poss yeast and BV onset 3 weeks after child birth... sxs include vag itchiness, white/gray d/c, foul odor, vag swelling.... Denies: abd/back pain, urinary sxs... Also c/o intermittent rash all over body... She is alert w/no signs of acute distress.

## 2012-08-17 NOTE — ED Provider Notes (Signed)
Medical screening examination/treatment/procedure(s) were performed by non-physician practitioner and as supervising physician I was immediately available for consultation/collaboration.   MORENO-COLL,Kyland No; MD  Trina Asch Moreno-Coll, MD 08/17/12 0756 

## 2012-08-21 ENCOUNTER — Ambulatory Visit (INDEPENDENT_AMBULATORY_CARE_PROVIDER_SITE_OTHER): Payer: Medicaid Other | Admitting: *Deleted

## 2012-08-21 ENCOUNTER — Ambulatory Visit (INDEPENDENT_AMBULATORY_CARE_PROVIDER_SITE_OTHER): Payer: Medicaid Other | Admitting: Obstetrics

## 2012-08-21 ENCOUNTER — Encounter: Payer: Self-pay | Admitting: Obstetrics

## 2012-08-21 VITALS — BP 107/74 | HR 90 | Temp 98.3°F | Wt 167.2 lb

## 2012-08-21 DIAGNOSIS — Z309 Encounter for contraceptive management, unspecified: Secondary | ICD-10-CM

## 2012-08-21 DIAGNOSIS — Z3202 Encounter for pregnancy test, result negative: Secondary | ICD-10-CM

## 2012-08-21 DIAGNOSIS — IMO0001 Reserved for inherently not codable concepts without codable children: Secondary | ICD-10-CM

## 2012-08-21 LAB — POCT URINE PREGNANCY: Preg Test, Ur: NEGATIVE

## 2012-08-21 MED ORDER — MEDROXYPROGESTERONE ACETATE 150 MG/ML IM SUSP
150.0000 mg | INTRAMUSCULAR | Status: AC
  Administered 2012-08-21 – 2013-05-06 (×4): 150 mg via INTRAMUSCULAR

## 2012-08-21 MED ORDER — MEDROXYPROGESTERONE ACETATE 150 MG/ML IM SUSP: 150.0000 mg | INTRAMUSCULAR | Status: DC

## 2012-08-21 NOTE — Progress Notes (Signed)
Patient is here today for her depo injection.  Upt negative and patient states that she has not had intercourse in four months.  Patient tolerated well and RTO for next injection 11/12/12.

## 2012-08-21 NOTE — Progress Notes (Signed)
Subjective:     Brittney [name] is a 22 y.o. female who presents for a postpartum visit. She is 5 weeks and 5 days postpartum following a spontaneous vaginal delivery. I have fully reviewed the prenatal and intrapartum course. The delivery was at 37 gestational weeks. Outcome: spontaneous vaginal delivery. Anesthesia: epidural. Postpartum course has been normal. Baby's course has been normal. Baby is feeding by bottle - Gerber Gentle. Bleeding no bleeding. Bowel function is normal. Bladder function is normal. Patient is not sexually active. Contraception method is abstinence. Postpartum depression screening: negative.  The following portions of the patient's history were reviewed and updated as appropriate: allergies, current medications, past family history, past medical history, past social history, past surgical history and problem list.  Review of Systems Pertinent items are noted in HPI.   Objective:    BP 138/94  Pulse 92  Temp(Src) 97.5 F (36.4 C) (Oral)  Wt 168 lb (76.204 kg)  BMI 30.72 kg/m2  Breastfeeding? No  General:  alert and no distress   Breasts:  inspection negative, no nipple discharge or bleeding, no masses or nodularity palpable  Lungs: not examined  Heart:  not examined  Abdomen: normal findings: soft, non-tender   Vulva:  normal  Vagina: normal vagina  Cervix:  no lesions  Corpus: normal size, contour, position, consistency, mobility, non-tender  Adnexa:  no mass, fullness, tenderness  Rectal Exam: Not performed.        Assessment:     Normal postpartum exam. Pap smear not done at today's visit.   Plan:    1. Contraception: abstinence 2. Depo Provera 3. Follow up in: 1 day or as needed.  Depo Provera

## 2012-08-22 ENCOUNTER — Encounter: Payer: Self-pay | Admitting: Obstetrics

## 2012-08-22 LAB — US OB DETAIL + 14 WK

## 2012-10-03 ENCOUNTER — Emergency Department (HOSPITAL_COMMUNITY)
Admission: EM | Admit: 2012-10-03 | Discharge: 2012-10-03 | Disposition: A | Discharge status: Home / Self Care | Payer: Medicaid Other | Attending: Emergency Medicine | Admitting: Emergency Medicine

## 2012-10-03 ENCOUNTER — Encounter (HOSPITAL_COMMUNITY): Payer: Self-pay | Admitting: Emergency Medicine

## 2012-10-03 DIAGNOSIS — B3731 Acute candidiasis of vulva and vagina: Secondary | ICD-10-CM | POA: Insufficient documentation

## 2012-10-03 DIAGNOSIS — B373 Candidiasis of vulva and vagina: Secondary | ICD-10-CM | POA: Diagnosis present

## 2012-10-03 DIAGNOSIS — Z79899 Other long term (current) drug therapy: Secondary | ICD-10-CM | POA: Insufficient documentation

## 2012-10-03 DIAGNOSIS — Z3202 Encounter for pregnancy test, result negative: Secondary | ICD-10-CM | POA: Insufficient documentation

## 2012-10-03 LAB — WET PREP, GENITAL
Clue Cells Wet Prep HPF POC: NONE SEEN
Trich, Wet Prep: NONE SEEN

## 2012-10-03 MED ORDER — FLUCONAZOLE 100 MG PO TABS
150.0000 mg | ORAL_TABLET | Freq: Once | ORAL | Status: AC
  Administered 2012-10-03: 150 mg via ORAL
  Filled 2012-10-03: qty 2

## 2012-10-03 NOTE — ED Notes (Signed)
Patient is alert and orientedx4.  Patient was explained discharge instructions and they understood them with no questions.  The patient's significant other, Suzan Garibaldi is taking the patient home.

## 2012-10-03 NOTE — ED Notes (Signed)
Family at bedside. 

## 2012-10-03 NOTE — ED Provider Notes (Signed)
CSN: [id number]     Arrival date & time 10/03/12  4696 History   First MD Initiated Contact with Patient 10/03/12 [id number]     No chief complaint on file.  (Consider location/radiation/quality/duration/timing/severity/associated sxs/prior Treatment) Patient is a 22 y.o. female presenting with female genitourinary complaint. The history is provided by the patient.  Female GU Problem This is a new problem. Episode onset: 2 days ago. The problem occurs constantly. The problem has not changed since onset.Pertinent negatives include no chest pain, no abdominal pain, no headaches and no shortness of breath. Nothing aggravates the symptoms. Nothing relieves the symptoms. She has tried nothing for the symptoms. The treatment provided no relief.    Past Medical History  Diagnosis Date  . Medical history non-contributory    Past Surgical History  Procedure Laterality Date  . No past surgeries     Family History  Problem Relation Age of Onset  . Diabetes Other    History  Substance Use Topics  . Smoking status: Never Smoker   . Smokeless tobacco: Never Used  . Alcohol Use: No   OB History   Grav Para Term Preterm Abortions TAB SAB Ect Mult Living   2 2 1 1      2      Review of Systems  Constitutional: Negative for fever and fatigue.  HENT: Negative for congestion, drooling and neck pain.   Eyes: Negative for pain.  Respiratory: Negative for cough and shortness of breath.   Cardiovascular: Negative for chest pain.  Gastrointestinal: Negative for nausea, vomiting, abdominal pain and diarrhea.  Genitourinary: Positive for vaginal discharge. Negative for dysuria, urgency, hematuria, vaginal bleeding, vaginal pain and pelvic pain.  Musculoskeletal: Negative for back pain and gait problem.  Skin: Negative for color change.  Neurological: Negative for dizziness and headaches.  Hematological: Negative for adenopathy.  Psychiatric/Behavioral: Negative for behavioral problems.  All other  systems reviewed and are negative.    Allergies  Review of patient's allergies indicates no known allergies.  Home Medications   Current Outpatient Rx  Name  Route  Sig  Dispense  Refill  . medroxyPROGESTERone (DEPO-PROVERA) 150 MG/ML injection   Intramuscular   Inject 1 mL (150 mg total) into the muscle every 3 (three) months.   1 mL   0   . Prenatal Vit-Fe Fumarate-FA (PRENATAL MULTIVITAMIN) TABS   Oral   Take 1 tablet by mouth daily.           There were no vitals taken for this visit. Physical Exam  Nursing note and vitals reviewed. Constitutional: She is oriented to person, place, and time. She appears well-developed and well-nourished.  HENT:  Head: Normocephalic.  Mouth/Throat: No oropharyngeal exudate.  Eyes: Conjunctivae and EOM are normal. Pupils are equal, round, and reactive to light.  Neck: Normal range of motion. Neck supple.  Cardiovascular: Normal rate, regular rhythm, normal heart sounds and intact distal pulses.  Exam reveals no gallop and no friction rub.   No murmur heard. Pulmonary/Chest: Effort normal and breath sounds normal. No respiratory distress. She has no wheezes.  Abdominal: Soft. Bowel sounds are normal. There is no tenderness. There is no rebound and no guarding.  Genitourinary: Vaginal discharge (white) found.  Normal appearing external vagina w/ mild white d/c. Normal appearing cervix, os closed. Small amount of white fluid in vaginal canal. No CMT. No pain during bimanual.   Musculoskeletal: Normal range of motion. She exhibits no edema and no tenderness.  Neurological: She is alert and oriented  to person, place, and time.  Skin: Skin is warm and dry.  Psychiatric: She has a normal mood and affect. Her behavior is normal.    ED Course  Procedures (including critical care time) Labs Review Labs Reviewed  WET PREP, GENITAL - Abnormal; Notable for the following:    Yeast Wet Prep HPF POC FEW (*)    WBC, Wet Prep HPF POC TOO NUMEROUS  TO COUNT (*)    All other components within normal limits  GC/CHLAMYDIA PROBE AMP  POCT PREGNANCY, URINE   Imaging Review No results found.  MDM   1. Vaginal candidiasis     7:34 AM 22 y.o. female s/p spont vag delivery in June '14, hx of multiple STD's who pw white vag d/c and itching c/w previous episodes of BV. LMP Aug 5. Currently sexually active. Pt AFVSS here, denies pain or urinary sx. Will perform pelvic.   Given fluconazole 150mg  x 1 here for vaginal candidiasis.   8:55 AM:  I have discussed the diagnosis/risks/treatment options with the patient and believe the pt to be eligible for discharge home to follow-up with pcp for continued sx. We also discussed returning to the ED immediately if new or worsening sx occur. We discussed the sx which are most concerning (e.g., worsening d/c, fever, pain) that necessitate immediate return. Any new prescriptions provided to the patient are listed below.  New Prescriptions   No medications on file     Junius Argyle, MD 10/03/12 2009

## 2012-10-03 NOTE — ED Notes (Signed)
Patient said she began having vaginal itching and vaginal discharge.  She denies pain, or difficulty urinating, vaginal odor.  She does say it itches and is uncomfortable.  Patient just had a baby in June.

## 2012-10-04 LAB — GC/CHLAMYDIA PROBE AMP: GC Probe RNA: NEGATIVE

## 2012-11-12 ENCOUNTER — Ambulatory Visit: Payer: Medicaid Other | Admitting: *Deleted

## 2012-11-12 VITALS — BP 110/74 | HR 76 | Temp 98.7°F | Wt 164.0 lb

## 2012-11-12 NOTE — Progress Notes (Signed)
Patient is in the office for her Depo injection. Patient reports she is doing well with the injection.

## 2012-11-15 ENCOUNTER — Encounter: Payer: Self-pay | Admitting: Obstetrics

## 2012-11-16 ENCOUNTER — Ambulatory Visit (INDEPENDENT_AMBULATORY_CARE_PROVIDER_SITE_OTHER): Payer: Medicaid Other | Admitting: *Deleted

## 2012-11-16 VITALS — BP 114/72 | HR 107 | Temp 98.4°F | Wt 161.0 lb

## 2012-11-16 DIAGNOSIS — Z3049 Encounter for surveillance of other contraceptives: Secondary | ICD-10-CM

## 2012-11-16 NOTE — Progress Notes (Signed)
Pt in office today for a Depo injection. Pt due to return to office February 07, 2013.

## 2012-11-19 ENCOUNTER — Ambulatory Visit: Payer: Medicaid Other

## 2013-02-08 ENCOUNTER — Ambulatory Visit: Payer: Medicaid Other

## 2013-02-11 ENCOUNTER — Ambulatory Visit (INDEPENDENT_AMBULATORY_CARE_PROVIDER_SITE_OTHER): Payer: Medicaid Other | Admitting: *Deleted

## 2013-02-11 VITALS — BP 134/81 | HR 88 | Temp 98.1°F | Wt 155.0 lb

## 2013-02-11 DIAGNOSIS — IMO0001 Reserved for inherently not codable concepts without codable children: Secondary | ICD-10-CM

## 2013-02-11 DIAGNOSIS — Z309 Encounter for contraceptive management, unspecified: Secondary | ICD-10-CM

## 2013-02-11 NOTE — Progress Notes (Signed)
Pt is in office for depo injection. Injection given in left upper outer quadrant. Pt tolerated well. Advised RTO 05/05/13.

## 2013-03-23 ENCOUNTER — Emergency Department (HOSPITAL_COMMUNITY)
Admission: EM | Admit: 2013-03-23 | Discharge: 2013-03-23 | Disposition: A | Discharge status: Home / Self Care | Payer: Medicaid Other | Attending: Emergency Medicine | Admitting: Emergency Medicine

## 2013-03-23 ENCOUNTER — Encounter (HOSPITAL_COMMUNITY): Payer: Self-pay | Admitting: Emergency Medicine

## 2013-03-23 DIAGNOSIS — L03119 Cellulitis of unspecified part of limb: Principal | ICD-10-CM

## 2013-03-23 DIAGNOSIS — L02419 Cutaneous abscess of limb, unspecified: Secondary | ICD-10-CM | POA: Insufficient documentation

## 2013-03-23 DIAGNOSIS — L0390 Cellulitis, unspecified: Secondary | ICD-10-CM

## 2013-03-23 MED ORDER — IBUPROFEN 600 MG PO TABS: 600.0000 mg | ORAL_TABLET | Freq: Four times a day (QID) | ORAL | Status: DC | PRN

## 2013-03-23 MED ORDER — CLINDAMYCIN HCL 150 MG PO CAPS: 450.0000 mg | ORAL_CAPSULE | Freq: Three times a day (TID) | ORAL | Status: DC

## 2013-03-23 NOTE — ED Notes (Signed)
Pt presents to department for evaluation of itching, redness and discomfort to L calf area. Pt states red, tender and itchy patch to L lower leg. States sister recently had bedbugs, concerned she has the same. Respirations unlabored. Pt is alert and oriented x4. No signs of acute distress noted.

## 2013-03-23 NOTE — ED Provider Notes (Signed)
CSN: [id number]     Arrival date & time 03/23/13  1038 History   First MD Initiated Contact with Patient 03/23/13 1049     Chief Complaint  Patient presents with  . Pruritis   The history is provided by the patient. No language interpreter was used.   This chart was scribed for non-physician practitioner working with Mellody Drown, PA-C by Andrew Au, ED Scribe. This patient was seen in room TR05C/TR05C and the patient's care was started at 11:19 AM.  Brittney [name] is a 23 y.o. female who presents to the Emergency Department complaining of a painful, red, itchy, bumps to leftcalf onset 2-3 days. She reports that it started off with just 2 mosquitoes bumps and states the pain has worsened.  She denies fever. She denies h/o DM. Pt denies allergies to medication. Pt does not have PCP.  Pt sister recently had bed bugs and was concern that she may on contracted them.   Past Medical History  Diagnosis Date  . Medical history non-contributory    Past Surgical History  Procedure Laterality Date  . No past surgeries     Family History  Problem Relation Age of Onset  . Diabetes Other    History  Substance Use Topics  . Smoking status: Never Smoker   . Smokeless tobacco: Never Used  . Alcohol Use: No   OB History   Grav Para Term Preterm Abortions TAB SAB Ect Mult Living   2 2 1 1      2      Review of Systems  Constitutional: Negative for fever and chills.  Gastrointestinal: Negative for nausea and vomiting.  Skin: Positive for color change and rash.    Allergies  Review of patient's allergies indicates no known allergies.  Home Medications   Current Outpatient Rx  Name  Route  Sig  Dispense  Refill  . medroxyPROGESTERone (DEPO-PROVERA) 150 MG/ML injection   Intramuscular   Inject 1 mL (150 mg total) into the muscle every 3 (three) months.   1 mL   0   . Prenatal Vit-Fe Fumarate-FA (PRENATAL MULTIVITAMIN) TABS   Oral   Take 1 tablet by mouth daily.            Triage Vitals: BP 136/66  Pulse 83  Temp(Src) 98.7 F (37.1 C) (Oral)  Resp 18  SpO2 100% Physical Exam  Nursing note and vitals reviewed. Constitutional: She is oriented to person, place, and time. She appears well-developed and well-nourished. No distress.  HENT:  Head: Normocephalic and atraumatic.  Eyes: EOM are normal.  Neck: Neck supple. No tracheal deviation present.  Cardiovascular: Normal rate.   Pulmonary/Chest: Effort normal. No respiratory distress.  Musculoskeletal: Normal range of motion.  Neurological: She is alert and oriented to person, place, and time.  Skin: Skin is warm and dry.  Left lower extremity. Left posterior calf with a 6x7 raised area of erythema. No fluctuance no drainage and increase temperature to touch.  Psychiatric: She has a normal mood and affect. Her behavior is normal.    ED Course  Procedures (including critical care time) Labs Review Labs Reviewed - No data to display Imaging Review No results found.  EKG Interpretation   None       MDM   Final diagnoses:  Cellulitis   Pt with a 3 day history of left lower leg erythema and pruritis.  PE consistent with cellulitis without abscess, afebrile in ED.  Will treat as an out-patient. Discussed treatment plan with  the patient. Return precautions given. Reports understanding and no other concerns at this time.  Patient is stable for discharge at this time.     Meds given in ED:  Medications - No data to display  Discharge Medication List as of 03/23/2013 11:37 AM    START taking these medications   Details  clindamycin (CLEOCIN) 150 MG capsule Take 3 capsules (450 mg total) by mouth 3 (three) times daily., Starting 03/23/2013, Last dose on Tue 04/02/13, Print    ibuprofen (ADVIL,MOTRIN) 600 MG tablet Take 1 tablet (600 mg total) by mouth every 6 (six) hours as needed. Take with food, Starting 03/23/2013, Until Discontinued, Print          I personally performed the services  described in this documentation, which was scribed in my presence. The recorded information has been reviewed and is accurate.     Clabe SealLauren M Sarh Kirschenbaum, PA-C 03/26/13 [phone]

## 2013-03-23 NOTE — Discharge Instructions (Signed)
Call for a follow up appointment with a Family or Primary Care Provider.  Return if Symptoms worsen, you have increased pain, redness or develop a fever. Take medication as prescribed.

## 2013-03-26 ENCOUNTER — Encounter (HOSPITAL_COMMUNITY): Payer: Self-pay | Admitting: Emergency Medicine

## 2013-03-26 ENCOUNTER — Emergency Department (HOSPITAL_COMMUNITY)
Admission: EM | Admit: 2013-03-26 | Discharge: 2013-03-27 | Disposition: A | Discharge status: Home / Self Care | Payer: Medicaid Other | Attending: Emergency Medicine | Admitting: Emergency Medicine

## 2013-03-26 DIAGNOSIS — L03119 Cellulitis of unspecified part of limb: Principal | ICD-10-CM

## 2013-03-26 DIAGNOSIS — Z79899 Other long term (current) drug therapy: Secondary | ICD-10-CM | POA: Insufficient documentation

## 2013-03-26 DIAGNOSIS — L02419 Cutaneous abscess of limb, unspecified: Secondary | ICD-10-CM | POA: Insufficient documentation

## 2013-03-26 DIAGNOSIS — T368X5A Adverse effect of other systemic antibiotics, initial encounter: Secondary | ICD-10-CM | POA: Insufficient documentation

## 2013-03-26 DIAGNOSIS — T50905A Adverse effect of unspecified drugs, medicaments and biological substances, initial encounter: Secondary | ICD-10-CM

## 2013-03-26 DIAGNOSIS — L0390 Cellulitis, unspecified: Secondary | ICD-10-CM

## 2013-03-26 DIAGNOSIS — Z792 Long term (current) use of antibiotics: Secondary | ICD-10-CM | POA: Insufficient documentation

## 2013-03-26 MED ORDER — CEPHALEXIN 500 MG PO CAPS: 500.0000 mg | ORAL_CAPSULE | Freq: Four times a day (QID) | ORAL | Status: DC

## 2013-03-26 MED ORDER — PREDNISONE 20 MG PO TABS
60.0000 mg | ORAL_TABLET | Freq: Once | ORAL | Status: AC
  Administered 2013-03-27: 60 mg via ORAL
  Filled 2013-03-26: qty 3

## 2013-03-26 NOTE — ED Notes (Signed)
Pt states that she started clindamycin on Sunday for the first time for cellulitis on the back of her leg. Pt states yesterday she noticed hives. Pt denies throat closing, SOB. Pt just has hives and itching.

## 2013-03-26 NOTE — Discharge Instructions (Signed)
Drug Allergy Allergic reactions to medicines are common. Some allergic reactions are mild. A delayed type of drug allergy that occurs 1 week or more after exposure to a medicine or vaccine is called serum sickness. A life-threatening, sudden (acute) allergic reaction that involves the whole body is called anaphylaxis. CAUSES  "True" drug allergies occur when there is an allergic reaction to a medicine. This is caused by overactivity of the immune system. First, the body becomes sensitized. The immune system is triggered by your first exposure to the medicine. Following this first exposure, future exposure to the same medicine may be life-threatening. Almost any medicine can cause an allergic reaction. Common ones are:  Penicillin.  Sulfonamides (sulfa drugs).  Local anesthetics.  X-ray dyes that contain iodine. SYMPTOMS  Common symptoms of a minor allergic reaction are:  Swelling around the mouth.  An itchy red rash or hives.  Vomiting or diarrhea. Anaphylaxis can cause swelling of the mouth and throat. This makes it difficult to breathe and swallow. Severe reactions can be fatal within seconds, even after exposure to only a trace amount of the drug that causes the reaction. HOME CARE INSTRUCTIONS   If you are unsure of what caused your reaction, keep a diary of foods and medicines used. Include the symptoms that followed. Avoid anything that causes reactions.  You may want to follow up with an allergy specialist after the reaction has cleared in order to be tested to confirm the allergy. It is important to confirm that your reaction is an allergy, not just a side effect to the medicine. If you have a true allergy to a medicine, this may prevent that medicine and related medicines from being given to you when you are very ill.  If you have hives or a rash:  Take medicines as directed by your caregiver.  You may use an over-the-counter antihistamine (diphenhydramine) as  needed.  Apply cold compresses to the skin or take baths in cool water. Avoid hot baths or showers.  If you are severely allergic:  Continuous observation after a severe reaction may be needed. Hospitalization is often required.  Wear a medical alert bracelet or necklace stating your allergy.  You and your family must learn how to use an anaphylaxis kit or give an epinephrine injection to temporarily treat an emergency allergic reaction. If you have had a severe reaction, always carry your epinephrine injection or anaphylaxis kit with you. This can be lifesaving if you have a severe reaction.  Do not drive or perform tasks after treatment until the medicines used to treat your reaction have worn off, or until your caregiver says it is okay. SEEK MEDICAL CARE IF:   You think you had an allergic reaction. Symptoms usually start within 30 minutes after exposure.  Symptoms are getting worse rather than better.  You develop new symptoms.  The symptoms that brought you to your caregiver return. SEEK IMMEDIATE MEDICAL CARE IF:   You have swelling of the mouth, difficulty breathing, or wheezing.  You have a tight feeling in your chest or throat.  You develop hives, swelling, or itching all over your body.  You develop severe vomiting or diarrhea.  You feel faint or pass out. This is an emergency. Use your epinephrine injection or anaphylaxis kit as you have been instructed. Call for emergency medical help. Even if you improve after the injection, you need to be examined at a hospital emergency department. MAKE SURE YOU:   Understand these instructions.  Will watch  your condition.  Will get help right away if you are not doing well or get worse. Document Released: 01/24/2005 Document Revised: 04/18/2011 Document Reviewed: 06/30/2010 [facility] Patient Information 2014 [place], [place].    Cellulitis Cellulitis is an infection of the skin and the tissue beneath it. The infected area  is usually red and tender. Cellulitis occurs most often in the arms and lower legs.  CAUSES  Cellulitis is caused by bacteria that enter the skin through cracks or cuts in the skin. The most common types of bacteria that cause cellulitis are Staphylococcus and Streptococcus. SYMPTOMS   Redness and warmth.  Swelling.  Tenderness or pain.  Fever. DIAGNOSIS  Your caregiver can usually determine what is wrong based on a physical exam. Blood tests may also be done. TREATMENT  Treatment usually involves taking an antibiotic medicine. HOME CARE INSTRUCTIONS   Take your antibiotics as directed. Finish them even if you start to feel better.  Keep the infected arm or leg elevated to reduce swelling.  Apply a warm cloth to the affected area up to 4 times per day to relieve pain.  Only take over-the-counter or prescription medicines for pain, discomfort, or fever as directed by your caregiver.  Keep all follow-up appointments as directed by your caregiver. SEEK MEDICAL CARE IF:   You notice red streaks coming from the infected area.  Your red area gets larger or turns dark in color.  Your bone or joint underneath the infected area becomes painful after the skin has healed.  Your infection returns in the same area or another area.  You notice a swollen bump in the infected area.  You develop new symptoms. SEEK IMMEDIATE MEDICAL CARE IF:   You have a fever.  You feel very sleepy.  You develop vomiting or diarrhea.  You have a general ill feeling (malaise) with muscle aches and pains. MAKE SURE YOU:   Understand these instructions.  Will watch your condition.  Will get help right away if you are not doing well or get worse. Document Released: 11/03/2004 Document Revised: 07/26/2011 Document Reviewed: 04/11/2011 [facility] Patient Information 2014 [place].

## 2013-03-26 NOTE — ED Provider Notes (Signed)
CSN: [id number]     Arrival date & time 03/26/13  2305 History   First MD Initiated Contact with Patient 03/26/13 2347     Chief Complaint  Patient presents with  . Rash     (Consider location/radiation/quality/duration/timing/severity/associated sxs/prior Treatment) HPI  Patient to the ER with complaints of rash to chest. She was seen on Sunday and diagnosed with Cellulitis to the back of her left calf. She was given an Rx for Clindamycin. Today she noticed some hives. She says that her cellulitis is almost gone [facility]. She has not had any chest pain, SOB, lip or tongue swelling, neck swelling, weakness or fevers.  Past Medical History  Diagnosis Date  . Medical history non-contributory    Past Surgical History  Procedure Laterality Date  . No past surgeries     Family History  Problem Relation Age of Onset  . Diabetes Other    History  Substance Use Topics  . Smoking status: Never Smoker   . Smokeless tobacco: Never Used  . Alcohol Use: No   OB History   Grav Para Term Preterm Abortions TAB SAB Ect Mult Living   2 2 1 1      2      Review of Systems  The patient denies anorexia, fever, weight loss,, vision loss, decreased hearing, hoarseness, chest pain, syncope, dyspnea on exertion, peripheral edema, balance deficits, hemoptysis, abdominal pain, melena, hematochezia, severe indigestion/heartburn, hematuria, incontinence, genital sores, muscle weakness, suspicious skin lesions, transient blindness, difficulty walking, depression, unusual weight change, abnormal bleeding, enlarged lymph nodes, angioedema, and breast masses.   Allergies  Review of patient's allergies indicates no known allergies.  Home Medications   Current Outpatient Rx  Name  Route  Sig  Dispense  Refill  . ibuprofen (ADVIL,MOTRIN) 600 MG tablet   Oral   Take 1 tablet (600 mg total) by mouth every 6 (six) hours as needed. Take with food   30 tablet   0   . medroxyPROGESTERone  (DEPO-PROVERA) 150 MG/ML injection   Intramuscular   Inject 1 mL (150 mg total) into the muscle every 3 (three) months.   1 mL   0   . Prenatal Vit-Fe Fumarate-FA (PRENATAL MULTIVITAMIN) TABS   Oral   Take 1 tablet by mouth daily.          . cephALEXin (KEFLEX) 500 MG capsule   Oral   Take 1 capsule (500 mg total) by mouth 4 (four) times daily.   40 capsule   0    BP 132/78  Pulse 90  Temp(Src) 98 F (36.7 C) (Oral)  Resp 18  Wt 154 lb 8 oz (70.081 kg)  SpO2 97% Physical Exam  Nursing note and vitals reviewed. Constitutional: She appears well-developed and well-nourished. No distress.  HENT:  Head: Normocephalic and atraumatic.  Eyes: Pupils are equal, round, and reactive to light.  Neck: Normal range of motion. Neck supple.  Cardiovascular: Normal rate and regular rhythm.   Pulmonary/Chest: Effort normal.  Abdominal: Soft.  Musculoskeletal:       Legs: Neurological: She is alert.  Skin: Skin is warm and dry.    ED Course  Procedures (including critical care time) Labs Review Labs Reviewed - No data to display Imaging Review No results found.  EKG Interpretation   None       MDM   Final diagnoses:  Medication reaction  Cellulitis    Clindamycin is discontinued 60 mg PO Prednisone given in the ED Patient  can take PO benadryl at home for itching   Medication switched to keflex. No airway involvement.  22 y.o.Brittney [name]'s evaluation in the Emergency Department is complete. It has been determined that no acute conditions requiring further emergency intervention are present at this time. The patient/guardian have been advised of the diagnosis and plan. We have discussed signs and symptoms that warrant return to the ED, such as changes or worsening in symptoms.  Vital signs are stable at discharge. Filed Vitals:   03/26/13 2312  BP: 132/78  Pulse: 90  Temp: 98 F (36.7 C)  Resp: 18    Patient/guardian has voiced understanding and  agreed to follow-up with the PCP or specialist.      Dorthula Matas, PA-C 03/27/13 0002

## 2013-03-27 NOTE — ED Notes (Signed)
Pt prescribed cleocin for cellulitis. Developed rash from medication. Denies SOB, difficulty breathing.

## 2013-03-27 NOTE — ED Provider Notes (Signed)
Medical screening examination/treatment/procedure(s) were performed by non-physician practitioner and as supervising physician I was immediately available for consultation/collaboration.  EKG Interpretation   None         Janard Culp L Darlen Gledhill, MD 03/27/13 0720 

## 2013-03-27 NOTE — ED Provider Notes (Signed)
Medical screening examination/treatment/procedure(s) were performed by non-physician practitioner and as supervising physician I was immediately available for consultation/collaboration.    Jahaan Vanwagner M Jermone Geister, MD 03/27/13 0408 

## 2013-04-24 ENCOUNTER — Emergency Department (HOSPITAL_COMMUNITY)
Admission: EM | Admit: 2013-04-24 | Discharge: 2013-04-24 | Disposition: A | Discharge status: Home / Self Care | Payer: Medicaid Other | Attending: Emergency Medicine | Admitting: Emergency Medicine

## 2013-04-24 DIAGNOSIS — Y929 Unspecified place or not applicable: Secondary | ICD-10-CM | POA: Insufficient documentation

## 2013-04-24 DIAGNOSIS — Z792 Long term (current) use of antibiotics: Secondary | ICD-10-CM | POA: Insufficient documentation

## 2013-04-24 DIAGNOSIS — Z872 Personal history of diseases of the skin and subcutaneous tissue: Secondary | ICD-10-CM | POA: Insufficient documentation

## 2013-04-24 DIAGNOSIS — Z79899 Other long term (current) drug therapy: Secondary | ICD-10-CM | POA: Insufficient documentation

## 2013-04-24 DIAGNOSIS — S30860A Insect bite (nonvenomous) of lower back and pelvis, initial encounter: Secondary | ICD-10-CM | POA: Insufficient documentation

## 2013-04-24 DIAGNOSIS — W57XXXA Bitten or stung by nonvenomous insect and other nonvenomous arthropods, initial encounter: Secondary | ICD-10-CM | POA: Insufficient documentation

## 2013-04-24 DIAGNOSIS — Y9389 Activity, other specified: Secondary | ICD-10-CM | POA: Insufficient documentation

## 2013-04-24 MED ORDER — HYDROXYZINE HCL 25 MG PO TABS: 25.0000 mg | ORAL_TABLET | Freq: Four times a day (QID) | ORAL | Status: DC

## 2013-04-24 NOTE — ED Notes (Signed)
Pt presents with rash and itching to right upper chest area since this AM. Pt finished cephalexin rx last week for cellulits infection to left leg that has now resolved. States this area feels the same. Pt denies any pain/fever/chills.

## 2013-04-24 NOTE — ED Provider Notes (Signed)
CSN: [id number]     Arrival date & time 04/24/13  1030 History  This chart was scribed for non-physician practitioner Brittney Anis PA-C working with Brittney Most B. Bernette Mayers, MD by Brittney [name], ED Scribe. This patient was seen in room [facility] and the patient's care was started at 11:51 AM.    Chief Complaint  Patient presents with  . Rash   The history is provided by the patient. No language interpreter was used.   HPI Comments: Brittney [name] is a 23 y.o. female who presents to the Emergency Department complaining of a constant, itchy rash to the right upper chest area onset yesterday morning. Pt with a recent h/o LLE cellulitis. She states the LLE cellulitis resolved with the cephalexin but she finished the course last week and she thinks it has come back on her chest. She denies rash anywhere else. She states her sister has bedbugs and she takes her sister's kids in her car.    Past Medical History  Diagnosis Date  . Medical history non-contributory    Past Surgical History  Procedure Laterality Date  . No past surgeries     Family History  Problem Relation Age of Onset  . Diabetes Other    History  Substance Use Topics  . Smoking status: Never Smoker   . Smokeless tobacco: Never Used  . Alcohol Use: No   OB History   Grav Para Term Preterm Abortions TAB SAB Ect Mult Living   2 2 1 1      2      Review of Systems  Skin: Positive for rash.  All other systems reviewed and are negative.      Allergies  Review of patient's allergies indicates no known allergies.  Home Medications   Current Outpatient Rx  Name  Route  Sig  Dispense  Refill  . cephALEXin (KEFLEX) 500 MG capsule   Oral   Take 1 capsule (500 mg total) by mouth 4 (four) times daily.   40 capsule   0   . ibuprofen (ADVIL,MOTRIN) 600 MG tablet   Oral   Take 1 tablet (600 mg total) by mouth every 6 (six) hours as needed. Take with food   30 tablet   0   . medroxyPROGESTERone (DEPO-PROVERA)  150 MG/ML injection   Intramuscular   Inject 1 mL (150 mg total) into the muscle every 3 (three) months.   1 mL   0   . Prenatal Vit-Fe Fumarate-FA (PRENATAL MULTIVITAMIN) TABS   Oral   Take 1 tablet by mouth daily.           BP 106/62  Pulse 70  Temp(Src) 98.1 F (36.7 C) (Oral)  Resp 20  Ht 5\' 2"  (1.575 m)  Wt 152 lb (68.947 kg)  BMI 27.79 kg/m2  SpO2 99% Physical Exam  Nursing note and vitals reviewed. Constitutional: She is oriented to person, place, and time. She appears well-developed and well-nourished. No distress.  HENT:  Head: Normocephalic and atraumatic.  Eyes: EOM are normal.  Neck: Neck supple. No tracheal deviation present.  Cardiovascular: Normal rate.   Pulmonary/Chest: Effort normal. No respiratory distress.  Musculoskeletal: Normal range of motion.  Neurological: She is alert and oriented to person, place, and time.  Skin: Skin is warm and dry. Rash (3 single bumps to the right chest that are erythematous nonfluctuant nodular to palpation. no drainage no bleeding. no pustules or blisters. consistent with insect bite.) noted.  Psychiatric: She has a normal mood and affect.  Her behavior is normal.    ED Course  Procedures (including critical care time) Medications - No data to display  DIAGNOSTIC STUDIES: Oxygen Saturation is 99% on RA, normal by my interpretation.    COORDINATION OF CARE: 11:58 AM- Discussed treatment plan with pt. Pt agrees to plan.    Labs Review Labs Reviewed - No data to display Imaging Review No results found.   EKG Interpretation None      MDM   Final diagnoses:  None    1. Insect bite  No evidence cellulitis, blistering rash. Nodular, singular lesions c/w uncomplicated insect bites with history of bed bug contact.   I personally performed the services described in this documentation, which was scribed in my presence. The recorded information has been reviewed and is accurate.     Brittney HookerShari A Rilda Bulls,  PA-C 05/10/13 1012

## 2013-04-24 NOTE — Discharge Instructions (Signed)
Bedbugs  Bedbugs are tiny bugs that live in and around beds. During the day, they hide in mattresses and other places near beds. They come out at night and bite people lying in bed. They need blood to live and grow. Bedbugs can be found in beds anywhere. Usually, they are found in places where many people come and go (hotels, shelters, hospitals). It does not matter whether the place is dirty or clean.  Getting bitten by bedbugs rarely causes a medical problem. The biggest problem can be getting rid of them.  This often takes the work of a pest control expert.  CAUSES  · Less use of pesticides. Bedbugs were common before the 1950s. Then, strong pesticides such as DDT nearly wiped them out. Today, these pesticides are not used because they harm the environment and can cause health problems.  · More travel. Besides mattresses, bedbugs can also live in clothing and luggage. They can come along as people travel from place to place. Bedbugs are more common in certain parts of the world. When people travel to those areas, the bugs can come home with them.  · Presence of birds and bats. Bedbugs often infest birds and bats. If you have these animals in or near your home, bedbugs may infest your house, too.  SYMPTOMS  It does not hurt to be bitten by a bedbug. You will probably not wake up when you are bitten. Bedbugs usually bite areas of the skin that are not covered. Symptoms may show when you wake up, or they may take a day or more to show up. Symptoms may include:  · Small red bumps on the skin. These might be lined up in a row or clustered in a group.  · A darker red dot in the middle of red bumps.  · Blisters on the skin. There may be swelling and very bad itching. These may be signs of an allergic reaction. This does not happen often.  DIAGNOSIS  Bedbug bites might look and feel like other types of insect bites. The bugs do not stay on the body like ticks or lice. They bite, drop off, and crawl away to hide. Your  caregiver will probably:  · Ask about your symptoms.  · Ask about your recent activities and travel.  · Check your skin for bedbug bites.  · Ask you to check at home for signs of bedbugs. You should look for:  · Spots or stains on the bed or nearby. This could be from bedbugs that were crushed or from their eggs or waste.  · Bedbugs themselves. They are reddish-brown, oval, and flat. They do not fly. They are about the size of an apple seed.  · Places to look for bedbugs include:  · Beds. Check mattresses, headboards, box springs, and bed frames.  · On drapes and curtains near the bed.  · Under carpeting in the bedroom.  · Behind electrical outlets.  · Behind any wallpaper that is peeling.  · Inside luggage.  TREATMENT  Most bedbug bites do not need treatment. They usually go away on their own in a few days. The bites are not dangerous. However, treatment may be needed if you have scratched so much that your skin has become infected. You may also need treatment if you are allergic to bedbug bites. Treatment options include:  · A drug that stops swelling and itching (corticosteroid). Usually, a cream is rubbed on the skin. If you have a bad rash, you may be   given a corticosteroid pill.  · Oral antihistamines. These are pills to help control itching.  · Antibiotic medicines. An antibiotic may be prescribed for infected skin.  HOME CARE INSTRUCTIONS   · Take any medicine prescribed by your caregiver for your bites. Follow the directions carefully.  · Consider wearing pajamas with long sleeves and pant legs.  · Your bedroom may need to be treated. A pest control expert should make sure the bedbugs are gone. You may need to throw away mattresses or luggage. Ask the pest control expert what you can do to keep the bedbugs from coming back. Common suggestions include:  · Putting a plastic cover over your mattress.  · Washing and drying your clothes and bedding in hot water and a hot dryer. The temperature should be hotter  than 120° F (48.9° C). Bedbugs are killed by high temperatures.  · Vacuuming carefully all around your bed. Vacuum in all cracks and crevices where the bugs might hide. Do this often.  · Carefully checking all used furniture, bedding, or clothes that you bring into your house.  · Eliminating bird nests and bat roosts.  · If you get bedbug bites when traveling, check all your possessions carefully before bringing them into your house. If you find any bugs on clothes or in your luggage, consider throwing those items away.  SEEK MEDICAL CARE IF:  · You have red bug bites that keep coming back.  · You have red bug bites that itch badly.  · You have bug bites that cause a skin rash.  · You have scratch marks that are red and sore.  SEEK IMMEDIATE MEDICAL CARE IF:  You have a fever.  Document Released: 02/26/2010 Document Revised: 04/18/2011 Document Reviewed: 02/26/2010  [employer]® Patient Information ©2014 [employer].

## 2013-05-06 ENCOUNTER — Ambulatory Visit: Payer: Medicaid Other

## 2013-05-06 ENCOUNTER — Ambulatory Visit (INDEPENDENT_AMBULATORY_CARE_PROVIDER_SITE_OTHER): Payer: Medicaid Other | Admitting: *Deleted

## 2013-05-06 VITALS — BP 117/73 | HR 75 | Temp 99.6°F | Wt 150.0 lb

## 2013-05-06 DIAGNOSIS — Z3049 Encounter for surveillance of other contraceptives: Secondary | ICD-10-CM

## 2013-05-06 DIAGNOSIS — Z309 Encounter for contraceptive management, unspecified: Secondary | ICD-10-CM

## 2013-05-06 NOTE — Progress Notes (Signed)
Patient in office today for a Depo injection. Patient  Is on time for her injection. Patient is requesting a refill on her Prenatal Vitamins.  Patient advised to return for next injection on 07-28-13.

## 2013-05-11 NOTE — ED Provider Notes (Signed)
Medical screening examination/treatment/procedure(s) were performed by non-physician practitioner and as supervising physician I was immediately available for consultation/collaboration.   EKG Interpretation None        Charles B. Sheldon, MD 05/11/13 0940 

## 2013-07-10 ENCOUNTER — Emergency Department (HOSPITAL_COMMUNITY)
Admission: EM | Admit: 2013-07-10 | Discharge: 2013-07-11 | Disposition: A | Discharge status: Home / Self Care | Payer: Medicaid Other | Attending: Emergency Medicine | Admitting: Emergency Medicine

## 2013-07-10 ENCOUNTER — Encounter (HOSPITAL_COMMUNITY): Payer: Self-pay | Admitting: Emergency Medicine

## 2013-07-10 DIAGNOSIS — J029 Acute pharyngitis, unspecified: Secondary | ICD-10-CM | POA: Insufficient documentation

## 2013-07-10 LAB — RAPID STREP SCREEN (MED CTR MEBANE ONLY): STREPTOCOCCUS, GROUP A SCREEN (DIRECT): NEGATIVE

## 2013-07-10 NOTE — ED Provider Notes (Signed)
CSN: [id number]     Arrival date & time 07/10/13  2217 History   First MD Initiated Contact with Patient 07/10/13 2308    This chart was scribed for non-physician practitioner, Wynetta EmeryNicole Rhodie Cienfuegos, PA, working with Brittney ArgyleForrest S Harrison, MD by Brittney [name], ED Scribe. This patient was seen in room TR08C/TR08C and the patient's care was started at 11:10 PM.  Chief Complaint  Patient presents with  . Sore Throat   The history is provided by the patient. No language interpreter was used.   HPI Comments: Brittney [name] is a 23 y.o. female who presents to the Emergency Department complaining of a sore throat onset 2-3 days ago. Pt reports her Sx worsened last night her; specifically, her right tonsil swelled and her mouth was filling up with saliva, leading to difficulty swallowing and inability to eat. Pt also complains of white spots in her throat. Presently, pt reports she is experiencing no pain in her throat. Pt denies fever, history of mononucleosis. Pt denies a Hx of sore throat and specifies the last time she had strep throat was when she was a child. Pt reports she is otherwise healthy and has no chronic medical problems.   Past Medical History  Diagnosis Date  . Medical history non-contributory    Past Surgical History  Procedure Laterality Date  . No past surgeries     Family History  Problem Relation Age of Onset  . Diabetes Other    History  Substance Use Topics  . Smoking status: Never Smoker   . Smokeless tobacco: Never Used  . Alcohol Use: No   OB History   Grav Para Term Preterm Abortions TAB SAB Ect Mult Living   2 2 1 1      2      Review of Systems  HENT: Positive for sore throat.   All other systems reviewed and are negative.   A complete 10 system review of systems was obtained and all systems are negative except as noted in the HPI and PMH.    Allergies  Review of patient's allergies indicates no known allergies.  Home Medications   Prior to Admission  medications   Not on File   Triage Vitals: BP 129/75  Pulse 101  Temp(Src) 98.5 F (36.9 C) (Oral)  Resp 18  Ht 5\' 2"  (1.575 m)  Wt 150 lb (68.04 kg)  BMI 27.43 kg/m2  SpO2 98% Physical Exam  Nursing note and vitals reviewed. Constitutional: She is oriented to person, place, and time. She appears well-developed and well-nourished. No distress.  HENT:  Head: Normocephalic and atraumatic.  Mouth/Throat: Oropharynx is clear and moist.  2+ tonsillar hypertrophy with exudate bilaterally. Uvula is midline and soft tissue rises symmetrically. Patient is handling her secretions  Eyes: Conjunctivae and EOM are normal. Pupils are equal, round, and reactive to light.  Neck: Normal range of motion. Neck supple. No tracheal deviation present.  Tender anterior cervical lymphadenopathy, worse on the right  Cardiovascular: Normal rate, regular rhythm and intact distal pulses.   Pulmonary/Chest: Effort normal and breath sounds normal. No stridor. No respiratory distress. She has no wheezes. She has no rales. She exhibits no tenderness.  Abdominal: Soft. Bowel sounds are normal. She exhibits no distension and no mass. There is no tenderness. There is no rebound and no guarding.  Musculoskeletal: Normal range of motion.  Lymphadenopathy:    She has cervical adenopathy.  Neurological: She is alert and oriented to person, place, and time.  Skin: Skin  is warm and dry.  Psychiatric: She has a normal mood and affect. Her behavior is normal.    ED Course  Procedures (including critical care time) DIAGNOSTIC STUDIES: Oxygen Saturation is 98% on RA, normal by my interpretation.    COORDINATION OF CARE:  11:15 PM-Discussed treatment plan which includes labs, meds with pt at bedside and pt agreed to plan. Pt is directed to return to the ED immediately if Sx worsen, including calling 911 if she begins to have difficulty swallowing and drooling.   Labs Review Labs Reviewed  RAPID STREP SCREEN  CULTURE,  GROUP A STREP  MONONUCLEOSIS SCREEN    Imaging Review No results found.   EKG Interpretation None      MDM   Final diagnoses:  Pharyngitis    Filed Vitals:   07/10/13 2220  BP: 129/75  Pulse: 101  Temp: 98.5 F (36.9 C)  TempSrc: Oral  Resp: 18  Height: 5\' 2"  (1.575 m)  Weight: 150 lb (68.04 kg)  SpO2: 98%    Medications - No data to display  Brittney [name] is a 23 y.o. female presenting with sore throat, tonsillar hypertrophy and exudate. Denies cough and fever. Patient does not have a history of mono, however mono test is negative. Given physical exam it is reasonable to treat for strep, despite rapid testing negative. Advised patient on plan which she is agreeable to. Offered IM Decadron but she declined. No signs of peritonsillar abscess.  Evaluation does not show pathology that would require ongoing emergent intervention or inpatient treatment. Pt is hemodynamically stable and mentating appropriately. Discussed findings and plan with patient/guardian, who agrees with care plan. All questions answered. Return precautions discussed and outpatient follow up given.   Discharge Medication List as of 07/11/2013  1:15 AM    START taking these medications   Details  amoxicillin (AMOXIL) 500 MG capsule Take 1 capsule (500 mg total) by mouth 3 (three) times daily., Starting 07/11/2013, Until Discontinued, Print        I personally performed the services described in this documentation, which was scribed in my presence. The recorded information has been reviewed and is accurate.    Wynetta Emery, PA-C 07/11/13 [phone]

## 2013-07-10 NOTE — ED Notes (Signed)
The pt has had a sore throat for 2 days no temp

## 2013-07-11 LAB — MONONUCLEOSIS SCREEN: MONO SCREEN: NEGATIVE

## 2013-07-11 MED ORDER — AMOXICILLIN 500 MG PO CAPS: 500.0000 mg | ORAL_CAPSULE | Freq: Three times a day (TID) | ORAL | Status: DC

## 2013-07-11 NOTE — ED Provider Notes (Signed)
Medical screening examination/treatment/procedure(s) were performed by non-physician practitioner and as supervising physician I was immediately available for consultation/collaboration.   EKG Interpretation None        Caileb Rhue S Gennie Eisinger, MD 07/11/13 1103 

## 2013-07-11 NOTE — Discharge Instructions (Signed)
For pain control please take ibuprofen (also known as Motrin or Advil) 800mg  (this is normally 4 over the counter pills) 3 times a day  for 5 days. Take with food to minimize stomach irritation.   Take your antibiotics as directed and to completion. You should never have any leftover antibiotics! Push fluids and stay well hydrated.   Please follow with your primary care doctor in the next 2 days for a check-up. They must obtain records for further management.   Do not hesitate to return to the Emergency Department for any new, worsening or concerning symptoms.   Pharyngitis Pharyngitis is redness, pain, and swelling (inflammation) of your pharynx.  CAUSES  Pharyngitis is usually caused by infection. Most of the time, these infections are from viruses (viral) and are part of a cold. However, sometimes pharyngitis is caused by bacteria (bacterial). Pharyngitis can also be caused by allergies. Viral pharyngitis may be spread from person to person by coughing, sneezing, and personal items or utensils (cups, forks, spoons, toothbrushes). Bacterial pharyngitis may be spread from person to person by more intimate contact, such as kissing.  SIGNS AND SYMPTOMS  Symptoms of pharyngitis include:   Sore throat.   Tiredness (fatigue).   Low-grade fever.   Headache.  Joint pain and muscle aches.  Skin rashes.  Swollen lymph nodes.  Plaque-like film on throat or tonsils (often seen with bacterial pharyngitis). DIAGNOSIS  Your health care provider will ask you questions about your illness and your symptoms. Your medical history, along with a physical exam, is often all that is needed to diagnose pharyngitis. Sometimes, a rapid strep test is done. Other lab tests may also be done, depending on the suspected cause.  TREATMENT  Viral pharyngitis will usually get better in 3 4 days without the use of medicine. Bacterial pharyngitis is treated with medicines that kill germs (antibiotics).  HOME  CARE INSTRUCTIONS   Drink enough water and fluids to keep your urine clear or pale yellow.   Only take over-the-counter or prescription medicines as directed by your health care provider:   If you are prescribed antibiotics, make sure you finish them even if you start to feel better.   Do not take aspirin.   Get lots of rest.   Gargle with 8 oz of salt water ( tsp of salt per 1 qt of water) as often as every 1 2 hours to soothe your throat.   Throat lozenges (if you are not at risk for choking) or sprays may be used to soothe your throat. SEEK MEDICAL CARE IF:   You have large, tender lumps in your neck.  You have a rash.  You cough up green, yellow-brown, or bloody spit. SEEK IMMEDIATE MEDICAL CARE IF:   Your neck becomes stiff.  You drool or are unable to swallow liquids.  You vomit or are unable to keep medicines or liquids down.  You have severe pain that does not go away with the use of recommended medicines.  You have trouble breathing (not caused by a stuffy nose). MAKE SURE YOU:   Understand these instructions.  Will watch your condition.  Will get help right away if you are not doing well or get worse. Document Released: 01/24/2005 Document Revised: 11/14/2012 Document Reviewed: 10/01/2012 [facility] Patient Information 2014 [place], [place].

## 2013-07-12 LAB — CULTURE, GROUP A STREP

## 2013-07-29 ENCOUNTER — Telehealth: Payer: Self-pay | Admitting: *Deleted

## 2013-07-29 ENCOUNTER — Ambulatory Visit: Payer: Medicaid Other

## 2013-07-29 ENCOUNTER — Other Ambulatory Visit: Payer: Self-pay | Admitting: Obstetrics

## 2013-07-29 ENCOUNTER — Ambulatory Visit (INDEPENDENT_AMBULATORY_CARE_PROVIDER_SITE_OTHER): Payer: Medicaid Other | Admitting: *Deleted

## 2013-07-29 VITALS — BP 108/73 | HR 78 | Temp 98.7°F | Ht 62.0 in | Wt 158.0 lb

## 2013-07-29 DIAGNOSIS — Z309 Encounter for contraceptive management, unspecified: Secondary | ICD-10-CM

## 2013-07-29 DIAGNOSIS — Z0000 Encounter for general adult medical examination without abnormal findings: Secondary | ICD-10-CM

## 2013-07-29 MED ORDER — MEDROXYPROGESTERONE ACETATE 150 MG/ML IM SUSP
150.0000 mg | INTRAMUSCULAR | Status: AC
  Administered 2013-07-29: 150 mg via INTRAMUSCULAR

## 2013-07-29 MED ORDER — CITRANATAL HARMONY 27-1-260 MG PO CAPS: 1.0000 | ORAL_CAPSULE | Freq: Every day | ORAL | Status: DC

## 2013-07-29 NOTE — Progress Notes (Signed)
Patient is in the office today for her DEPO Injection. Patient is on time for her Injection. Injection given in Right Upper Outer Quadrant. Patient tolerated well. Patient advised to return to the office October 20, 2013 for her next Injection. Patient asked if she had any questions or concerns. Patient states she would like a refill on her vitamins. Dr. Clearance CootsHarper sent prescription to patients pharmacy.   BP 108/73  Pulse 78  Temp(Src) 98.7 F (37.1 C)  Ht 5\' 2"  (1.575 m)  Wt 158 lb (71.668 kg)  BMI 28.89 kg/m2  LMP 07/15/2013  Administrations This Visit   medroxyPROGESTERone (DEPO-PROVERA) injection 150 mg   Administered Action Dose Route Administered By   07/29/2013 Given 150 mg Intramuscular Odessa FlemingKristina M Bohne, LPN

## 2013-09-04 NOTE — Telephone Encounter (Signed)
Error

## 2013-10-15 ENCOUNTER — Telehealth: Payer: Self-pay | Admitting: *Deleted

## 2013-10-15 ENCOUNTER — Other Ambulatory Visit: Payer: Self-pay | Admitting: *Deleted

## 2013-10-15 DIAGNOSIS — Z3042 Encounter for surveillance of injectable contraceptive: Secondary | ICD-10-CM

## 2013-10-15 MED ORDER — MEDROXYPROGESTERONE ACETATE 150 MG/ML IM SUSP: 150.0000 mg | INTRAMUSCULAR | Status: DC

## 2013-10-15 NOTE — Telephone Encounter (Signed)
Patient called stating she needed a refill on her DEPO. Patient notified Rx was sent to her pharmacy. Patient notified that her DEPO appointment tomorrow we would need to schedule her for an annual exam because she is due for one. Patient voiced understanding.

## 2013-10-16 ENCOUNTER — Ambulatory Visit (INDEPENDENT_AMBULATORY_CARE_PROVIDER_SITE_OTHER): Payer: Medicaid Other | Admitting: *Deleted

## 2013-10-16 VITALS — BP 121/75 | HR 76 | Temp 97.8°F | Ht 62.0 in | Wt 156.0 lb

## 2013-10-16 DIAGNOSIS — Z0000 Encounter for general adult medical examination without abnormal findings: Secondary | ICD-10-CM

## 2013-10-16 DIAGNOSIS — Z3049 Encounter for surveillance of other contraceptives: Secondary | ICD-10-CM

## 2013-10-16 MED ORDER — CITRANATAL HARMONY 27-1-260 MG PO CAPS: 1.0000 | ORAL_CAPSULE | Freq: Every day | ORAL | Status: DC

## 2013-10-24 ENCOUNTER — Telehealth: Payer: Self-pay | Admitting: *Deleted

## 2013-10-24 NOTE — Telephone Encounter (Signed)
Patient states she is currently on the Depo. Patient states she is having issues with vision changes, light headedness, dizziness and fatigue. Patient states she started having these she before getting her last injection. Patient states she is concerned that it may be a side effect of her birth control. Patient states she would like to discontinue use of the Depo. Patient states she will keep her appointment for her AEX in December 201. Patient she does not want to try any other birth control method at this time.

## 2014-01-07 ENCOUNTER — Ambulatory Visit: Payer: Medicaid Other | Admitting: Obstetrics

## 2014-01-21 ENCOUNTER — Ambulatory Visit (INDEPENDENT_AMBULATORY_CARE_PROVIDER_SITE_OTHER): Payer: Medicaid Other | Admitting: Obstetrics

## 2014-01-21 ENCOUNTER — Encounter: Payer: Self-pay | Admitting: Obstetrics

## 2014-01-21 VITALS — BP 117/75 | HR 88 | Temp 98.8°F | Ht 64.0 in | Wt 159.0 lb

## 2014-01-21 DIAGNOSIS — Z113 Encounter for screening for infections with a predominantly sexual mode of transmission: Secondary | ICD-10-CM

## 2014-01-21 DIAGNOSIS — K640 First degree hemorrhoids: Secondary | ICD-10-CM

## 2014-01-21 DIAGNOSIS — Z01419 Encounter for gynecological examination (general) (routine) without abnormal findings: Secondary | ICD-10-CM

## 2014-01-21 DIAGNOSIS — Z0000 Encounter for general adult medical examination without abnormal findings: Secondary | ICD-10-CM

## 2014-01-21 MED ORDER — HYDROCORTISONE ACETATE 25 MG RE SUPP: 25.0000 mg | Freq: Two times a day (BID) | RECTAL | Status: DC

## 2014-01-21 MED ORDER — OB COMPLETE PETITE 35-5-1-200 MG PO CAPS: 1.0000 | ORAL_CAPSULE | Freq: Every day | ORAL | Status: DC

## 2014-01-21 NOTE — Progress Notes (Signed)
Subjective:     Brittney [name] is a 23 y.o. female here for a routine exam.  Current complaints: Hemorrhoids.    Personal health questionnaire:  Is patient Ashkenazi Jewish, have a family history of breast and/or ovarian cancer: no Is there a family history of uterine cancer diagnosed at age < 4450, gastrointestinal cancer, urinary tract cancer, family member who is a Personnel officerLynch syndrome-associated carrier: no Is the patient overweight and hypertensive, family history of diabetes, personal history of gestational diabetes or PCOS: no Is patient over 4555, have PCOS,  family history of premature CHD under age 25, diabetes, smoke, have hypertension or peripheral artery disease:  no At any time, has a partner hit, kicked or otherwise hurt or frightened you?: no Over the past 2 weeks, have you felt down, depressed or hopeless?: no Over the past 2 weeks, have you felt little interest or pleasure in doing things?:no   Gynecologic History No LMP recorded. Patient has had an injection. Contraception: Depo-Provera injections Last Pap: 2014. Results were: normal Last mammogram: n/a. Results were: n/a  Obstetric History OB History  Gravida Para Term Preterm AB SAB TAB Ectopic Multiple Living  2 2 1 1      2     # Outcome Date GA Lbr Len/2nd Weight Sex Delivery Anes PTL Lv  2 Term 07/12/12 7020w0d 13:06 / 00:07 6 lb 14.6 oz (3.135 kg) M Vag-Spont None  Y  1 Preterm 10/11/05 626w0d  7 lb 8 oz (3.402 kg) M Vag-Spont EPI  Y     Comments: No complications      Past Medical History  Diagnosis Date  . Medical history non-contributory     Past Surgical History  Procedure Laterality Date  . No past surgeries      Current outpatient prescriptions: hydrocortisone (ANUSOL-HC) 25 MG suppository, Place 1 suppository (25 mg total) rectally 2 (two) times daily., Disp: 24 suppository, Rfl: 0;  medroxyPROGESTERone (DEPO-PROVERA) 150 MG/ML injection, Inject 1 mL (150 mg total) into the muscle every 3 (three)  months. (Patient not taking: Reported on 01/21/2014), Disp: 1 mL, Rfl: 0 Prenat-FeCbn-FeAspGl-FA-Omega (OB COMPLETE PETITE) 35-5-1-200 MG CAPS, Take 1 capsule by mouth daily before breakfast., Disp: 30 capsule, Rfl: 11;  Prenat-FeFmCb-DSS-FA-DHA w/o A (CITRANATAL HARMONY) 27-1-260 MG CAPS, Take 1 capsule by mouth daily before breakfast. (Patient not taking: Reported on 01/21/2014), Disp: 30 capsule, Rfl: 11 Current facility-administered medications: medroxyPROGESTERone (DEPO-PROVERA) injection 150 mg, 150 mg, Intramuscular, Q90 days, Brock Badharles A Arbor Cohen, MD, 150 mg at 07/29/13 1735 No Known Allergies  History  Substance Use Topics  . Smoking status: Never Smoker   . Smokeless tobacco: Never Used  . Alcohol Use: No    Family History  Problem Relation Age of Onset  . Diabetes Other       Review of Systems  Constitutional: negative for fatigue and weight loss Respiratory: negative for cough and wheezing Cardiovascular: negative for chest pain, fatigue and palpitations Gastrointestinal: negative for abdominal pain and change in bowel habits.  Positive for hemorrhoids. Musculoskeletal:negative for myalgias Neurological: negative for gait problems and tremors Behavioral/Psych: negative for abusive relationship, depression Endocrine: negative for temperature intolerance   Genitourinary:negative for abnormal menstrual periods, genital lesions, hot flashes, sexual problems and vaginal discharge Integument/breast: negative for breast lump, breast tenderness, nipple discharge and skin lesion(s)    Objective:       BP 117/75 mmHg  Pulse 88  Temp(Src) 98.8 F (37.1 C)  Ht 5\' 4"  (1.626 m)  Wt 159 lb (72.122 kg)  BMI 27.28 kg/m2 General:   alert  Skin:   no rash or abnormalities  Lungs:   clear to auscultation bilaterally  Heart:   regular rate and rhythm, S1, S2 normal, no murmur, click, rub or gallop  Breasts:   normal without suspicious masses, skin or nipple changes or axillary nodes   Abdomen:  normal findings: no organomegaly, soft, non-tender and no hernia  Pelvis:  External genitalia: normal general appearance Urinary system: urethral meatus normal and bladder without fullness, nontender Vaginal: normal without tenderness, induration or masses Cervix: normal appearance Adnexa: normal bimanual exam Uterus: anteverted and non-tender, normal size   Lab Review Urine pregnancy test Labs reviewed yes Radiologic studies reviewed no    Assessment:    Healthy female exam.    Hemorrhoids  Contraceptive Surveillance   [facility]   Education reviewed: low fat, low cholesterol diet, safe sex/STD prevention, self breast exams and weight bearing exercise. Contraception: Depo-Provera injections. Follow up in: 1 year.   Meds ordered this encounter  Medications  . hydrocortisone (ANUSOL-HC) 25 MG suppository    Sig: Place 1 suppository (25 mg total) rectally 2 (two) times daily.    Dispense:  24 suppository    Refill:  0  . Prenat-FeCbn-FeAspGl-FA-Omega (OB COMPLETE PETITE) 35-5-1-200 MG CAPS    Sig: Take 1 capsule by mouth daily before breakfast.    Dispense:  30 capsule    Refill:  11   Orders Placed This Encounter  Procedures  . SureSwab, Vaginosis/Vaginitis Plus

## 2014-01-22 LAB — PAP IG W/ RFLX HPV ASCU

## 2014-01-24 LAB — SURESWAB, VAGINOSIS/VAGINITIS PLUS
ATOPOBIUM VAGINAE: NOT DETECTED Log (cells/mL)
C. ALBICANS, DNA: NOT DETECTED
C. glabrata, DNA: NOT DETECTED
C. parapsilosis, DNA: NOT DETECTED
C. trachomatis RNA, TMA: NOT DETECTED
C. tropicalis, DNA: NOT DETECTED
Gardnerella vaginalis: <4.7 Log (cells/mL)
LACTOBACILLUS SPECIES: 7.9 Log (cells/mL)
MEGASPHAERA SPECIES: NOT DETECTED Log (cells/mL)
N. gonorrhoeae RNA, TMA: NOT DETECTED
T. VAGINALIS RNA, QL TMA: NOT DETECTED

## 2014-05-09 ENCOUNTER — Emergency Department (HOSPITAL_COMMUNITY)
Admission: EM | Admit: 2014-05-09 | Discharge: 2014-05-09 | Disposition: A | Discharge status: Home / Self Care | Payer: Medicaid Other | Attending: Emergency Medicine | Admitting: Emergency Medicine

## 2014-05-09 ENCOUNTER — Encounter (HOSPITAL_COMMUNITY): Payer: Self-pay | Admitting: Emergency Medicine

## 2014-05-09 DIAGNOSIS — Z3202 Encounter for pregnancy test, result negative: Secondary | ICD-10-CM | POA: Diagnosis not present

## 2014-05-09 DIAGNOSIS — N760 Acute vaginitis: Secondary | ICD-10-CM | POA: Diagnosis not present

## 2014-05-09 DIAGNOSIS — Z79899 Other long term (current) drug therapy: Secondary | ICD-10-CM | POA: Diagnosis not present

## 2014-05-09 DIAGNOSIS — Z7952 Long term (current) use of systemic steroids: Secondary | ICD-10-CM | POA: Insufficient documentation

## 2014-05-09 DIAGNOSIS — B9689 Other specified bacterial agents as the cause of diseases classified elsewhere: Secondary | ICD-10-CM

## 2014-05-09 DIAGNOSIS — N898 Other specified noninflammatory disorders of vagina: Secondary | ICD-10-CM | POA: Diagnosis present

## 2014-05-09 LAB — URINALYSIS, ROUTINE W REFLEX MICROSCOPIC
Bilirubin Urine: NEGATIVE
Glucose, UA: NEGATIVE mg/dL
Hgb urine dipstick: NEGATIVE
Ketones, ur: NEGATIVE mg/dL
LEUKOCYTES UA: NEGATIVE
Nitrite: NEGATIVE
PH: 6.5 (ref 5.0–8.0)
Protein, ur: NEGATIVE mg/dL
SPECIFIC GRAVITY, URINE: 1.022 (ref 1.005–1.030)
Urobilinogen, UA: 0.2 mg/dL (ref 0.0–1.0)

## 2014-05-09 LAB — WET PREP, GENITAL
TRICH WET PREP: NONE SEEN
Yeast Wet Prep HPF POC: NONE SEEN

## 2014-05-09 LAB — POC URINE PREG, ED: Preg Test, Ur: NEGATIVE

## 2014-05-09 MED ORDER — METRONIDAZOLE 500 MG PO TABS: 500.0000 mg | ORAL_TABLET | Freq: Two times a day (BID) | ORAL | Status: DC

## 2014-05-09 NOTE — Discharge Instructions (Signed)
Bacterial Vaginosis Bacterial vaginosis is a vaginal infection that occurs when the normal balance of bacteria in the vagina is disrupted. It results from an overgrowth of certain bacteria. This is the most common vaginal infection in women of childbearing age. Treatment is important to prevent complications, especially in pregnant women, as it can cause a premature delivery. CAUSES  Bacterial vaginosis is caused by an increase in harmful bacteria that are normally present in smaller amounts in the vagina. Several different kinds of bacteria can cause bacterial vaginosis. However, the reason that the condition develops is not fully understood. RISK FACTORS Certain activities or behaviors can put you at an increased risk of developing bacterial vaginosis, including:  Having a new sex partner or multiple sex partners.  Douching.  Using an intrauterine device (IUD) for contraception. Women do not get bacterial vaginosis from toilet seats, bedding, swimming pools, or contact with objects around them. SIGNS AND SYMPTOMS  Some women with bacterial vaginosis have no signs or symptoms. Common symptoms include:  Grey vaginal discharge.  A fishlike odor with discharge, especially after sexual intercourse.  Itching or burning of the vagina and vulva.  Burning or pain with urination. DIAGNOSIS  Your health care provider will take a medical history and examine the vagina for signs of bacterial vaginosis. A sample of vaginal fluid may be taken. Your health care provider will look at this sample under a microscope to check for bacteria and abnormal cells. A vaginal pH test may also be done.  TREATMENT  Bacterial vaginosis may be treated with antibiotic medicines. These may be given in the form of a pill or a vaginal cream. A second round of antibiotics may be prescribed if the condition comes back after treatment.  HOME CARE INSTRUCTIONS   Only take over-the-counter or prescription medicines as  directed by your health care provider.  If antibiotic medicine was prescribed, take it as directed. Make sure you finish it even if you start to feel better.  Do not have sex until treatment is completed.  Tell all sexual partners that you have a vaginal infection. They should see their health care provider and be treated if they have problems, such as a mild rash or itching.  Practice safe sex by using condoms and only having one sex partner. SEEK MEDICAL CARE IF:   Your symptoms are not improving after 3 days of treatment.  You have increased discharge or pain.  You have a fever. MAKE SURE YOU:   Understand these instructions.  Will watch your condition.  Will get help right away if you are not doing well or get worse. FOR MORE INFORMATION  Centers for Disease Control and Prevention, Division of STD Prevention: www.cdc.gov/std American Sexual Health Association (ASHA): www.ashastd.org  Document Released: 01/24/2005 Document Revised: 11/14/2012 Document Reviewed: 09/05/2012 ExitCare Patient Information 2015 [employer]. This information is not intended to replace advice given to you by your health care provider. Make sure you discuss any questions you have with your health care provider.   Metronidazole tablets or capsules What is this medicine? METRONIDAZOLE (me troe NI da zole) is an antiinfective. It is used to treat certain kinds of bacterial and protozoal infections. It will not work for colds, flu, or other viral infections. This medicine may be used for other purposes; ask your health care provider or pharmacist if you have questions. COMMON BRAND NAME(S): Flagyl What should I tell my health care provider before I take this medicine? They need to know if you have   any of these conditions: -anemia or other blood disorders -disease of the nervous system -fungal or yeast infection -if you drink alcohol containing drinks -liver disease -seizures -an unusual or  allergic reaction to metronidazole, or other medicines, foods, dyes, or preservatives -pregnant or trying to get pregnant -breast-feeding How should I use this medicine? Take this medicine by mouth with a full glass of water. Follow the directions on the prescription label. Take your medicine at regular intervals. Do not take your medicine more often than directed. Take all of your medicine as directed even if you think you are better. Do not skip doses or stop your medicine early. Talk to your pediatrician regarding the use of this medicine in children. Special care may be needed. Overdosage: If you think you have taken too much of this medicine contact a poison control center or emergency room at once. NOTE: This medicine is only for you. Do not share this medicine with others. What if I miss a dose? If you miss a dose, take it as soon as you can. If it is almost time for your next dose, take only that dose. Do not take double or extra doses. What may interact with this medicine? Do not take this medicine with any of the following medications: -alcohol or any product that contains alcohol -amprenavir oral solution -cisapride -disulfiram -dofetilide -dronedarone -paclitaxel injection -pimozide -ritonavir oral solution -sertraline oral solution -sulfamethoxazole-trimethoprim injection -thioridazine -ziprasidone This medicine may also interact with the following medications: -birth control pills -cimetidine -lithium -other medicines that prolong the QT interval (cause an abnormal heart rhythm) -phenobarbital -phenytoin -warfarin This list may not describe all possible interactions. Give your health care provider a list of all the medicines, herbs, non-prescription drugs, or dietary supplements you use. Also tell them if you smoke, drink alcohol, or use illegal drugs. Some items may interact with your medicine. What should I watch for while using this medicine? Tell your doctor or  health care professional if your symptoms do not improve or if they get worse. You may get drowsy or dizzy. Do not drive, use machinery, or do anything that needs mental alertness until you know how this medicine affects you. Do not stand or sit up quickly, especially if you are an older patient. This reduces the risk of dizzy or fainting spells. Avoid alcoholic drinks while you are taking this medicine and for three days afterward. Alcohol may make you feel dizzy, sick, or flushed. If you are being treated for a sexually transmitted disease, avoid sexual contact until you have finished your treatment. Your sexual partner may also need treatment. What side effects may I notice from receiving this medicine? Side effects that you should report to your doctor or health care professional as soon as possible: -allergic reactions like skin rash or hives, swelling of the face, lips, or tongue -confusion, clumsiness -difficulty speaking -discolored or sore mouth -dizziness -fever, infection -numbness, tingling, pain or weakness in the hands or feet -trouble passing urine or change in the amount of urine -redness, blistering, peeling or loosening of the skin, including inside the mouth -seizures -unusually weak or tired -vaginal irritation, dryness, or discharge Side effects that usually do not require medical attention (report to your doctor or health care professional if they continue or are bothersome): -diarrhea -headache -irritability -metallic taste -nausea -stomach pain or cramps -trouble sleeping This list may not describe all possible side effects. Call your doctor for medical advice about side effects. You may report side effects to   FDA at 1-800-FDA-1088. Where should I keep my medicine? Keep out of the reach of children. Store at room temperature below 25 degrees C (77 degrees F). Protect from light. Keep container tightly closed. Throw away any unused medicine after the expiration  date. NOTE: This sheet is a summary. It may not cover all possible information. If you have questions about this medicine, talk to your doctor, pharmacist, or health care provider.  2015, Elsevier/Gold Standard. (2012-08-31 14:08:39)  

## 2014-05-09 NOTE — ED Provider Notes (Signed)
CSN: [id number]     Arrival date & time 05/09/14  1403 History   First MD Initiated Contact with Patient 05/09/14 1654     Chief Complaint  Patient presents with  . Vaginal Discharge     (Consider location/radiation/quality/duration/timing/severity/associated sxs/prior Treatment) Patient is a 24 y.o. female presenting with vaginal discharge. The history is provided by the patient.  Vaginal Discharge She comes in complaining of vaginal discharge which she thinks is bacterial vaginosis. She took a bubble bath 2 nights ago and was doing well until this morning when she noted a thin vaginal discharge with a fishy odor. She denies vaginal pain, dysuria, pelvic pain, abdominal pain. There is no nausea or vomiting. She denies fever or chills. She has had bacterial vaginosis before and this is how it presented. Of note, she had been on Depo-Provera injections for contraception but has not had any in over 3 months. She is not currently using any contraception.  Past Medical History  Diagnosis Date  . Medical history non-contributory    Past Surgical History  Procedure Laterality Date  . No past surgeries     Family History  Problem Relation Age of Onset  . Diabetes Other    History  Substance Use Topics  . Smoking status: Never Smoker   . Smokeless tobacco: Never Used  . Alcohol Use: No   OB History    Gravida Para Term Preterm AB TAB SAB Ectopic Multiple Living   2 2 1 1      2      Review of Systems  Genitourinary: Positive for vaginal discharge.  All other systems reviewed and are negative.     Allergies  Review of patient's allergies indicates no known allergies.  Home Medications   Prior to Admission medications   Medication Sig Start Date End Date Taking? Authorizing Provider  hydrocortisone (ANUSOL-HC) 25 MG suppository Place 1 suppository (25 mg total) rectally 2 (two) times daily. 01/21/14   Brock Badharles A Harper, MD  medroxyPROGESTERone (DEPO-PROVERA) 150 MG/ML injection  Inject 1 mL (150 mg total) into the muscle every 3 (three) months. Patient not taking: Reported on 01/21/2014 10/15/13   Brock Badharles A Harper, MD  Prenat-FeCbn-FeAspGl-FA-Omega (OB COMPLETE PETITE) 35-5-1-200 MG CAPS Take 1 capsule by mouth daily before breakfast. 01/21/14   Brock Badharles A Harper, MD  Prenat-FeFmCb-DSS-FA-DHA w/o A (CITRANATAL HARMONY) 27-1-260 MG CAPS Take 1 capsule by mouth daily before breakfast. Patient not taking: Reported on 01/21/2014 10/16/13   Brock Badharles A Harper, MD   BP 121/74 mmHg  Pulse 77  Temp(Src) 98.5 F (36.9 C) (Oral)  Resp 18  Ht 5\' 2"  (1.575 m)  Wt 160 lb (72.576 kg)  BMI 29.26 kg/m2  SpO2 99%  LMP  (LMP Unknown) Physical Exam  Nursing note and vitals reviewed.  24 year old female, resting comfortably and in no acute distress. Vital signs are normal. Oxygen saturation is 99%, which is normal. Head is normocephalic and atraumatic. PERRLA, EOMI. Oropharynx is clear. Neck is nontender and supple without adenopathy or JVD. Back is nontender and there is no CVA tenderness. Lungs are clear without rales, wheezes, or rhonchi. Chest is nontender. Heart has regular rate and rhythm without murmur. Abdomen is soft, flat, nontender without masses or hepatosplenomegaly and peristalsis is normoactive. Pelvic: Normal external female genitalia. Small to moderate amount of white vaginal discharge present. Cervix is closed.Cervical motion tenderness. Fundus is normal size and position and nontender. No adnexal masses or tenderness. Extremities have no cyanosis or edema, full range of  motion is present. Skin is warm and dry without rash. Neurologic: Mental status is normal, cranial nerves are intact, there are no motor or sensory deficits.  ED Course  Procedures (including critical care time) Labs Review Results for orders placed or performed during the hospital encounter of 05/09/14  Wet prep, genital  Result Value Ref Range   Yeast Wet Prep HPF POC NONE SEEN NONE SEEN    Trich, Wet Prep NONE SEEN NONE SEEN   Clue Cells Wet Prep HPF POC FEW (A) NONE SEEN   WBC, Wet Prep HPF POC MANY (A) NONE SEEN  Urinalysis, Routine w reflex microscopic  Result Value Ref Range   Color, Urine YELLOW YELLOW   APPearance CLEAR CLEAR   Specific Gravity, Urine 1.022 1.005 - 1.030   pH 6.5 5.0 - 8.0   Glucose, UA NEGATIVE NEGATIVE mg/dL   Hgb urine dipstick NEGATIVE NEGATIVE   Bilirubin Urine NEGATIVE NEGATIVE   Ketones, ur NEGATIVE NEGATIVE mg/dL   Protein, ur NEGATIVE NEGATIVE mg/dL   Urobilinogen, UA 0.2 0.0 - 1.0 mg/dL   Nitrite NEGATIVE NEGATIVE   Leukocytes, UA NEGATIVE NEGATIVE  POC urine preg, ED (not at [facility])  Result Value Ref Range   Preg Test, Ur NEGATIVE NEGATIVE   MDM   Final diagnoses:  Bacterial vaginosis    Vaginal discharge which may represent bacterial vaginosis. Wet prep is pending.  Wet prep does have few clue cells. Given patient's symptoms, will treat for bacterial vaginosis. She is discharged with prescription for metronidazole.  Dione Booze, MD 05/09/14 [phone]

## 2014-05-09 NOTE — ED Notes (Signed)
Pt states has vaginal discharge, itching, burning-- hx of yeast infections, also took a bubble bath last night.

## 2014-05-09 NOTE — ED Notes (Signed)
Pt is in stable condition upon d/c. 

## 2014-05-10 LAB — HIV ANTIBODY (ROUTINE TESTING W REFLEX): HIV SCREEN 4TH GENERATION: NONREACTIVE

## 2014-05-12 ENCOUNTER — Ambulatory Visit: Payer: Medicaid Other | Admitting: Obstetrics

## 2014-05-12 LAB — GC/CHLAMYDIA PROBE AMP (~~LOC~~) NOT AT ARMC
CHLAMYDIA, DNA PROBE: NEGATIVE
Neisseria Gonorrhea: NEGATIVE

## 2014-05-23 ENCOUNTER — Other Ambulatory Visit (INDEPENDENT_AMBULATORY_CARE_PROVIDER_SITE_OTHER): Payer: Medicaid Other | Admitting: Obstetrics

## 2014-05-23 ENCOUNTER — Other Ambulatory Visit: Payer: Self-pay | Admitting: Obstetrics

## 2014-05-23 VITALS — BP 142/86 | HR 103 | Temp 98.6°F | Ht 62.0 in | Wt 156.0 lb

## 2014-05-23 DIAGNOSIS — R350 Frequency of micturition: Secondary | ICD-10-CM

## 2014-05-23 DIAGNOSIS — I10 Essential (primary) hypertension: Secondary | ICD-10-CM

## 2014-05-23 DIAGNOSIS — N912 Amenorrhea, unspecified: Secondary | ICD-10-CM

## 2014-05-23 LAB — COMPREHENSIVE METABOLIC PANEL
ALT: <8 U/L (ref 0–35)
AST: 11 U/L (ref 0–37)
Albumin: 3.7 g/dL (ref 3.5–5.2)
Alkaline Phosphatase: 43 U/L (ref 39–117)
BUN: 7 mg/dL (ref 6–23)
CHLORIDE: 104 meq/L (ref 96–112)
CO2: 23 meq/L (ref 19–32)
Calcium: 9.2 mg/dL (ref 8.4–10.5)
Creat: 0.72 mg/dL (ref 0.50–1.10)
GLUCOSE: 75 mg/dL (ref 70–99)
POTASSIUM: 4 meq/L (ref 3.5–5.3)
Sodium: 138 mEq/L (ref 135–145)
TOTAL PROTEIN: 6.7 g/dL (ref 6.0–8.3)
Total Bilirubin: 0.9 mg/dL (ref 0.2–1.2)

## 2014-05-23 LAB — POCT URINALYSIS DIPSTICK
Blood, UA: NEGATIVE
Glucose, UA: NEGATIVE
Ketones, UA: NEGATIVE
Nitrite, UA: NEGATIVE
PROTEIN UA: NEGATIVE
SPEC GRAV UA: 1.02
pH, UA: 5

## 2014-05-23 LAB — CBC
HEMATOCRIT: 37.3 % (ref 36.0–46.0)
Hemoglobin: 12.6 g/dL (ref 12.0–15.0)
MCH: 30.4 pg (ref 26.0–34.0)
MCHC: 33.8 g/dL (ref 30.0–36.0)
MCV: 90.1 fL (ref 78.0–100.0)
MPV: 9.2 fL (ref 8.6–12.4)
Platelets: 315 10*3/uL (ref 150–400)
RBC: 4.14 MIL/uL (ref 3.87–5.11)
RDW: 13.3 % (ref 11.5–15.5)
WBC: 7.7 10*3/uL (ref 4.0–10.5)

## 2014-05-23 LAB — TSH: TSH: 0.873 u[IU]/mL (ref 0.350–4.500)

## 2014-05-23 LAB — POCT URINE PREGNANCY: PREG TEST UR: NEGATIVE

## 2014-05-23 MED ORDER — MEDROXYPROGESTERONE ACETATE 10 MG PO TABS: 10.0000 mg | ORAL_TABLET | Freq: Every day | ORAL | Status: DC

## 2014-05-23 NOTE — Progress Notes (Signed)
Patient stopped Depo Provera in December and has not had a cycle. Patient has been having PMS symptoms- breast tenderness, fatigue, and urinary symptoms. Patient was suspicious of pregnancy.

## 2014-05-27 ENCOUNTER — Other Ambulatory Visit: Payer: Self-pay | Admitting: Obstetrics

## 2014-05-27 DIAGNOSIS — N390 Urinary tract infection, site not specified: Secondary | ICD-10-CM

## 2014-05-27 LAB — URINE CULTURE: Colony Count: >=100000

## 2014-05-27 MED ORDER — NITROFURANTOIN MONOHYD MACRO 100 MG PO CAPS: 100.0000 mg | ORAL_CAPSULE | Freq: Two times a day (BID) | ORAL | Status: DC

## 2014-06-05 ENCOUNTER — Encounter: Payer: Self-pay | Admitting: Obstetrics

## 2014-06-05 ENCOUNTER — Ambulatory Visit (INDEPENDENT_AMBULATORY_CARE_PROVIDER_SITE_OTHER): Payer: Medicaid Other | Admitting: Obstetrics

## 2014-06-05 VITALS — BP 114/80 | HR 98 | Temp 98.0°F | Ht 62.0 in | Wt 155.0 lb

## 2014-06-05 DIAGNOSIS — B379 Candidiasis, unspecified: Secondary | ICD-10-CM | POA: Diagnosis not present

## 2014-06-05 DIAGNOSIS — N760 Acute vaginitis: Secondary | ICD-10-CM

## 2014-06-05 DIAGNOSIS — A499 Bacterial infection, unspecified: Secondary | ICD-10-CM

## 2014-06-05 DIAGNOSIS — B9689 Other specified bacterial agents as the cause of diseases classified elsewhere: Secondary | ICD-10-CM

## 2014-06-05 MED ORDER — FLUCONAZOLE 150 MG PO TABS: 150.0000 mg | ORAL_TABLET | Freq: Once | ORAL | Status: DC

## 2014-06-05 MED ORDER — METRONIDAZOLE 0.75 % VA GEL: 1.0000 | Freq: Every day | VAGINAL | Status: DC

## 2014-06-05 NOTE — Progress Notes (Signed)
Patient not seen by physician. Nursing visit only.

## 2014-06-05 NOTE — Progress Notes (Signed)
Patient in office for a bacterial infection. Patient states she was seen recently at the urgent care. Patient states when she went she was informed she had bacterial vaginosis. Patient was given Metronidazole and states she has been vomiting the medication. Patient states she is still having the discharge with odor. Per Dr. Clearance CootsHarper okay to send Metrogel. Patient also states she has noticed some itching. Patient has also been sent Diflucan to the pharmacy. Patient was not seen by the provider today.

## 2014-06-23 ENCOUNTER — Ambulatory Visit: Payer: Medicaid Other | Admitting: Obstetrics

## 2014-07-02 ENCOUNTER — Ambulatory Visit: Payer: Medicaid Other | Admitting: Obstetrics

## 2014-08-04 ENCOUNTER — Emergency Department (HOSPITAL_COMMUNITY)
Admission: EM | Admit: 2014-08-04 | Discharge: 2014-08-04 | Disposition: A | Discharge status: Home / Self Care | Payer: Medicaid Other | Attending: Emergency Medicine | Admitting: Emergency Medicine

## 2014-08-04 ENCOUNTER — Emergency Department (HOSPITAL_COMMUNITY): Payer: Medicaid Other

## 2014-08-04 ENCOUNTER — Encounter (HOSPITAL_COMMUNITY): Payer: Self-pay | Admitting: Emergency Medicine

## 2014-08-04 DIAGNOSIS — Z79899 Other long term (current) drug therapy: Secondary | ICD-10-CM | POA: Diagnosis not present

## 2014-08-04 DIAGNOSIS — Z3202 Encounter for pregnancy test, result negative: Secondary | ICD-10-CM | POA: Diagnosis not present

## 2014-08-04 DIAGNOSIS — R002 Palpitations: Secondary | ICD-10-CM | POA: Diagnosis present

## 2014-08-04 LAB — CBC
HCT: 37.6 % (ref 36.0–46.0)
Hemoglobin: 12.2 g/dL (ref 12.0–15.0)
MCH: 29.2 pg (ref 26.0–34.0)
MCHC: 32.4 g/dL (ref 30.0–36.0)
MCV: 90 fL (ref 78.0–100.0)
PLATELETS: 241 10*3/uL (ref 150–400)
RBC: 4.18 MIL/uL (ref 3.87–5.11)
RDW: 13.4 % (ref 11.5–15.5)
WBC: 8.2 10*3/uL (ref 4.0–10.5)

## 2014-08-04 LAB — I-STAT TROPONIN, ED: TROPONIN I, POC: 0.01 ng/mL (ref 0.00–0.08)

## 2014-08-04 LAB — BASIC METABOLIC PANEL
Anion gap: 5 (ref 5–15)
BUN: 10 mg/dL (ref 6–20)
CALCIUM: 8.9 mg/dL (ref 8.9–10.3)
CO2: 27 mmol/L (ref 22–32)
CREATININE: 0.82 mg/dL (ref 0.44–1.00)
Chloride: 106 mmol/L (ref 101–111)
Glucose, Bld: 122 mg/dL — ABNORMAL HIGH (ref 65–99)
Potassium: 3.9 mmol/L (ref 3.5–5.1)
Sodium: 138 mmol/L (ref 135–145)

## 2014-08-04 LAB — POC URINE PREG, ED: PREG TEST UR: NEGATIVE

## 2014-08-04 NOTE — ED Notes (Signed)
Pt c/o heart skipping a beat intermittently x weeks.  Pt states that happens around 3-4 times per day.  Pt states that will notice when she is up moving around during the day then sometimes will happen while sleeping and wake her up.

## 2014-08-04 NOTE — ED Provider Notes (Signed)
CSN: [id number]     Arrival date & time 08/04/14  1835 History   First MD Initiated Contact with Patient 08/04/14 2114     Chief Complaint  Patient presents with  . Palpitations     (Consider location/radiation/quality/duration/timing/severity/associated sxs/prior Treatment) HPI Comments: Patient here with palpitations that began approximately 3 weeks ago when she started a new job. States that she is only getting 3-4 hours of sleep per night. Denies any stimulant use at this time. No anginal type chest pain or chest pressure. No medications used for this prior to arrival  Patient is a 24 y.o. female presenting with palpitations. The history is provided by the patient.  Palpitations Palpitations quality:  Irregular Onset quality:  Sudden Duration:  10 seconds Timing:  Intermittent Progression:  Waxing and waning Chronicity:  New Context: not caffeine, not illicit drugs and not stimulant use   Relieved by:  Nothing Worsened by:  Nothing Ineffective treatments:  None tried Associated symptoms: no near-syncope, no syncope and no weakness     Past Medical History  Diagnosis Date  . Medical history non-contributory    Past Surgical History  Procedure Laterality Date  . No past surgeries     Family History  Problem Relation Age of Onset  . Diabetes Other   . Hypertension Mother   . Hypertension Father    History  Substance Use Topics  . Smoking status: Never Smoker   . Smokeless tobacco: Never Used  . Alcohol Use: No   OB History    Gravida Para Term Preterm AB TAB SAB Ectopic Multiple Living   Review of Systems  Cardiovascular: Positive for palpitations. Negative for syncope and near-syncope.  Neurological: Negative for weakness.  All other systems reviewed and are negative.     Allergies  Review of patient's allergies indicates no known allergies.  Home Medications   Prior to Admission medications   Medication Sig Start Date End Date  Taking? Authorizing Provider  fluconazole (DIFLUCAN) 150 MG tablet Take 1 tablet (150 mg total) by mouth once. 06/05/14   Brock Bad, MD  medroxyPROGESTERone (PROVERA) 10 MG tablet Take 1 tablet (10 mg total) by mouth daily. 05/23/14   Brock Bad, MD  metroNIDAZOLE (METROGEL VAGINAL) 0.75 % vaginal gel Place 1 Applicatorful vaginally at bedtime. 06/05/14   Brock Bad, MD   BP 133/81 mmHg  Pulse 80  Resp 18  SpO2 100%  LMP 07/25/2014 Physical Exam  Constitutional: She is oriented to person, place, and time. She appears well-developed and well-nourished.  Non-toxic appearance. No distress.  HENT:  Head: Normocephalic and atraumatic.  Eyes: Conjunctivae, EOM and lids are normal. Pupils are equal, round, and reactive to light.  Neck: Normal range of motion. Neck supple. No tracheal deviation present. No thyroid mass present.  Cardiovascular: Normal rate, regular rhythm and normal heart sounds.  Exam reveals no gallop.   No murmur heard. Pulmonary/Chest: Effort normal and breath sounds normal. No stridor. No respiratory distress. She has no decreased breath sounds. She has no wheezes. She has no rhonchi. She has no rales.  Abdominal: Soft. Normal appearance and bowel sounds are normal. She exhibits no distension. There is no tenderness. There is no rebound and no CVA tenderness.  Musculoskeletal: Normal range of motion. She exhibits no edema or tenderness.  Neurological: She is alert and oriented to person, place, and time. She has normal strength. No cranial nerve  deficit or sensory deficit. GCS eye subscore is 4. GCS verbal subscore is 5. GCS motor subscore is 6.  Skin: Skin is warm and dry. No abrasion and no rash noted.  Psychiatric: She has a normal mood and affect. Her speech is normal and behavior is normal.  Nursing note and vitals reviewed.   ED Course  Procedures (including critical care time) Labs Review Labs Reviewed  BASIC METABOLIC PANEL - Abnormal; Notable for  the following:    Glucose, Bld 122 (*)    All other components within normal limits  CBC  I-STAT TROPOININ, ED  POC URINE PREG, ED    Imaging Review Dg Chest 2 View  08/04/2014   CLINICAL DATA:  Shortness of breath, chest palpitations.  EXAM: CHEST  2 VIEW  COMPARISON:  None.  FINDINGS: The heart size and mediastinal contours are within normal limits. Both lungs are clear. No pneumothorax or pleural effusion is noted. The visualized skeletal structures are unremarkable.  IMPRESSION: No active cardiopulmonary disease.   Electronically Signed   By: Lupita RaiderJames  Green Jr, M.D.   On: 08/04/2014 19:32     EKG Interpretation   Date/Time:  Monday August 04 2014 18:43:59 EDT Ventricular Rate:  87 PR Interval:  129 QRS Duration: 92 QT Interval:  378 QTC Calculation: 455 R Axis:   90 Text Interpretation:  Sinus rhythm Borderline right axis deviation  Baseline wander in lead(s) V2 V6 Confirmed by Freida BusmanALLEN  MD, Marshal Schrecengost ([id number])  on 08/04/2014 9:31:52 PM      MDM   Final diagnoses:  None    She's blood pressure stable here. EKG without signs of ectopy. Patient stable for discharge    Lorre NickAnthony Shylynn Bruning, MD 08/04/14 2138

## 2014-08-04 NOTE — ED Notes (Signed)
Pt states she has been having heart palpitations x couple weeks, states it happens daily, states multiple times a day, states when first happen it woke her up, but now it happens while she is at work, states that is the last time it happen was at work today, pt in no distress, pt denies n/v/d, states when this happens she sometimes has shortness of breath.

## 2014-08-04 NOTE — Discharge Instructions (Signed)

## 2014-09-30 ENCOUNTER — Ambulatory Visit: Payer: Medicaid Other | Admitting: Obstetrics

## 2014-10-09 ENCOUNTER — Emergency Department (HOSPITAL_COMMUNITY)
Admission: EM | Admit: 2014-10-09 | Discharge: 2014-10-09 | Disposition: A | Discharge status: Home / Self Care | Payer: Medicaid Other | Attending: Physician Assistant | Admitting: Physician Assistant

## 2014-10-09 ENCOUNTER — Encounter (HOSPITAL_COMMUNITY): Payer: Self-pay | Admitting: Emergency Medicine

## 2014-10-09 DIAGNOSIS — R531 Weakness: Secondary | ICD-10-CM | POA: Diagnosis not present

## 2014-10-09 DIAGNOSIS — Z79899 Other long term (current) drug therapy: Secondary | ICD-10-CM | POA: Insufficient documentation

## 2014-10-09 DIAGNOSIS — R630 Anorexia: Secondary | ICD-10-CM | POA: Insufficient documentation

## 2014-10-09 DIAGNOSIS — R5383 Other fatigue: Secondary | ICD-10-CM | POA: Insufficient documentation

## 2014-10-09 DIAGNOSIS — R42 Dizziness and giddiness: Secondary | ICD-10-CM | POA: Diagnosis not present

## 2014-10-09 DIAGNOSIS — J029 Acute pharyngitis, unspecified: Secondary | ICD-10-CM | POA: Diagnosis present

## 2014-10-09 LAB — RAPID STREP SCREEN (MED CTR MEBANE ONLY): Streptococcus, Group A Screen (Direct): NEGATIVE

## 2014-10-09 MED ORDER — DEXAMETHASONE SODIUM PHOSPHATE 10 MG/ML IJ SOLN
10.0000 mg | Freq: Once | INTRAMUSCULAR | Status: AC
  Administered 2014-10-09: 10 mg via INTRAMUSCULAR
  Filled 2014-10-09: qty 1

## 2014-10-09 NOTE — Discharge Instructions (Signed)
Please use warm saltwater gargles, ibuprofen or Tylenol as needed for pain. Please follow-up immediately if new or worsening signs or symptoms present.

## 2014-10-09 NOTE — ED Provider Notes (Signed)
CSN: [id number]     Arrival date & time 10/09/14  1857 History  This chart was scribed for non-physician practitioner, Eyvonne Mechanic, PA-C working with Abelino Derrick, MD by Doreatha Martin, ED scribe. This patient was seen in room TR06C/TR06C and the patient's care was started at 7:52 PM    Chief Complaint  Patient presents with  . Sore Throat   The history is provided by the patient. No language interpreter was used.    HPI Comments: Brittney [name] is a 24 y.o. female who presents to the Emergency Department complaining of moderate sore throat onset this week with associated tonsillar edema onset today, rhinorrhea, fatigue, lightheadedness, generalized weakness, fever (Tmax 99), decreased apetite. She states her last meal was this morning. She has tried a saltwater rinse with mild relief. Pt reports pain is worsened with eating and drinking. No tylenol, ibuprofen use PTA. No sick contact. She notes that she has similar symptoms once a year. She denies cough, otalgia, congestion, rashes. She also denies pregnancy, breastfeeding, DM.   Past Medical History  Diagnosis Date  . Medical history non-contributory    Past Surgical History  Procedure Laterality Date  . No past surgeries     Family History  Problem Relation Age of Onset  . Diabetes Other   . Hypertension Mother   . Hypertension Father    Social History  Substance Use Topics  . Smoking status: Never Smoker   . Smokeless tobacco: Never Used  . Alcohol Use: No   OB History    Gravida Para Term Preterm AB TAB SAB Ectopic Multiple Living   Review of Systems  All other systems reviewed and are negative.  Allergies  Review of patient's allergies indicates no known allergies.  Home Medications   Prior to Admission medications   Medication Sig Start Date End Date Taking? Authorizing Provider  fluconazole (DIFLUCAN) 150 MG tablet Take 1 tablet (150 mg total) by mouth once. 06/05/14   Brock Bad, MD  medroxyPROGESTERone (PROVERA) 10 MG tablet Take 1 tablet (10 mg total) by mouth daily. 05/23/14   Brock Bad, MD  metroNIDAZOLE (METROGEL VAGINAL) 0.75 % vaginal gel Place 1 Applicatorful vaginally at bedtime. 06/05/14   Brock Bad, MD   BP 125/67 mmHg  Pulse 97  Temp(Src) 99 F (37.2 C) (Oral)  Resp 14  SpO2 99%  LMP 09/10/2014 Physical Exam  Constitutional: She is oriented to person, place, and time. She appears well-developed and well-nourished.  HENT:  Head: Normocephalic and atraumatic.  Mouth/Throat: Uvula is midline. Oropharyngeal exudate and posterior oropharyngeal erythema present.  Tonsils are erythematous. Bilateral exudate, symmetrical. Uvula midline.   Eyes: Conjunctivae and EOM are normal. Pupils are equal, round, and reactive to light.  Neck: Normal range of motion. Neck supple.  Cardiovascular: Normal rate.   Pulmonary/Chest: Effort normal. No respiratory distress.  Abdominal: She exhibits no distension.  Musculoskeletal: Normal range of motion.  Neurological: She is alert and oriented to person, place, and time.  Skin: Skin is warm and dry.  Psychiatric: She has a normal mood and affect. Her behavior is normal.  Nursing note and vitals reviewed.  ED Course  Procedures (including critical care time) Labs Review Labs Reviewed  RAPID STREP SCREEN (NOT AT [facility])  CULTURE, GROUP A STREP    Imaging Review No results found. I have personally reviewed and evaluated these images and lab results as  part of my medical decision-making.   EKG Interpretation None      MDM   Final diagnoses:  Pharyngitis    Labs: Rapid strep screen. Group A, strep culture   Imaging:  Consults:  Therapeutics: 10mL Decadron IM.   Discharge Meds:   Assessment/Plan:   Pt presents with likely viral pharyngitis. No difficulty swallowing, drooling, dysphonia,muffled voice, stridor, swelling of the neck, trismus, mouth pain, swelling/ pain in submandibular  area or floor of mouth ,assymetry of tonsils, or ulcerations; unlikely epiglottitis, PTA, submandibular space infection, retropharyngeal space infection, or HIV. Pt treated here in the ED with therapeutics listed above, given strict return precautions, PCP follow-up for re-evaluation if symptoms persist beyond 5-7 days in duration, return to the ED if they worsen. Pt verbalized understanding and agreement to today's plan and had no further questions or concerns at the time of discharge.   t advised to continue warm salt water rinses, use Ibuprofen and Tylenol for pain management, follow up with PCP if symptoms worsen.   I personally performed the services described in this documentation, which was scribed in my presence. The recorded information has been reviewed and is accurate.   Eyvonne Mechanic, PA-C 10/09/14 2018  Courteney Randall An, MD 10/09/14 2115

## 2014-10-09 NOTE — ED Notes (Signed)
Pt. reports sore throat/swelling onset this morning , denies cough / no fever or chills.

## 2014-10-12 LAB — CULTURE, GROUP A STREP: Strep A Culture: NEGATIVE

## 2014-10-27 ENCOUNTER — Ambulatory Visit: Payer: Medicaid Other | Admitting: Family Medicine

## 2014-11-11 ENCOUNTER — Ambulatory Visit: Payer: Medicaid Other | Admitting: Family Medicine

## 2014-12-13 ENCOUNTER — Emergency Department (HOSPITAL_COMMUNITY)
Admission: EM | Admit: 2014-12-13 | Discharge: 2014-12-13 | Disposition: A | Discharge status: Home / Self Care | Payer: Medicaid Other | Attending: Emergency Medicine | Admitting: Emergency Medicine

## 2014-12-13 ENCOUNTER — Encounter (HOSPITAL_COMMUNITY): Payer: Self-pay | Admitting: Oncology

## 2014-12-13 DIAGNOSIS — S161XXA Strain of muscle, fascia and tendon at neck level, initial encounter: Secondary | ICD-10-CM | POA: Diagnosis not present

## 2014-12-13 DIAGNOSIS — Y998 Other external cause status: Secondary | ICD-10-CM | POA: Diagnosis not present

## 2014-12-13 DIAGNOSIS — T148XXA Other injury of unspecified body region, initial encounter: Secondary | ICD-10-CM

## 2014-12-13 DIAGNOSIS — X58XXXA Exposure to other specified factors, initial encounter: Secondary | ICD-10-CM | POA: Insufficient documentation

## 2014-12-13 DIAGNOSIS — Y9389 Activity, other specified: Secondary | ICD-10-CM | POA: Diagnosis not present

## 2014-12-13 DIAGNOSIS — M542 Cervicalgia: Secondary | ICD-10-CM | POA: Diagnosis present

## 2014-12-13 DIAGNOSIS — Y9289 Other specified places as the place of occurrence of the external cause: Secondary | ICD-10-CM | POA: Insufficient documentation

## 2014-12-13 MED ORDER — IBUPROFEN 800 MG PO TABS: 800.0000 mg | ORAL_TABLET | Freq: Three times a day (TID) | ORAL | Status: DC

## 2014-12-13 MED ORDER — CYCLOBENZAPRINE HCL 10 MG PO TABS: 10.0000 mg | ORAL_TABLET | Freq: Two times a day (BID) | ORAL | Status: DC | PRN

## 2014-12-13 NOTE — ED Provider Notes (Signed)
CSN: [id number]     Arrival date & time 12/13/14  2142 History  By signing my name below, I, Evon Slack, attest that this documentation has been prepared under the direction and in the presence of [employer], PA-C. Electronically Signed: Evon Slack, ED Scribe. 12/13/2014. 10:42 PM.    Chief Complaint  Patient presents with  . Neck Pain    The history is provided by the patient. No language interpreter was used.   HPI Comments: Brittney [name] is a 24 y.o. female who presents to the Emergency Department complaining of new right sided neck pain onset 1 night prior. 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 Pt states that the pain is worse when turning her head to the right. Pt states she has tried  motrin with no relief. Pt denies CP or numbness.    Past Medical History  Diagnosis Date  . Medical history non-contributory    Past Surgical History  Procedure Laterality Date  . No past surgeries     Family History  Problem Relation Age of Onset  . Diabetes Other   . Hypertension Mother   . Hypertension Father    Social History  Substance Use Topics  . Smoking status: Never Smoker   . Smokeless tobacco: Never Used  . Alcohol Use: No   OB History    Gravida Para Term Preterm AB TAB SAB Ectopic Multiple Living   Review of Systems  Cardiovascular: Negative for chest pain.  Musculoskeletal: Positive for neck pain.  Neurological: Negative for numbness.  All other systems reviewed and are negative.     Allergies  Review of patient's allergies indicates no known allergies.  Home Medications   Prior to Admission medications   Medication Sig Start Date End Date Taking? Authorizing Provider  ibuprofen (ADVIL,MOTRIN) 200 MG tablet Take 200 mg by mouth every 6 (six) hours as needed (for pain.).   Yes Historical Provider, MD   BP 107/73 mmHg  Pulse 88  Temp(Src) 98 F (36.7 C) (Oral)  Resp  16  Ht  (1.575 m)  Wt 150 lb (68.04 kg)  BMI 27.43 kg/m2  SpO2 98%  LMP 11/13/2014 (Exact Date)   Physical Exam  Constitutional: She is oriented to person, place, and time. She appears well-developed and well-nourished. No distress.  HENT:  Head: Normocephalic and atraumatic.  Eyes: Conjunctivae and EOM are normal.  Neck: Neck supple. No tracheal deviation present.  Cardiovascular: Normal rate.   Pulmonary/Chest: Effort normal. No respiratory distress.  Musculoskeletal: Normal range of motion.  No midline cervical tenderness, right trapezius tenderness without palpable spasm, full ROM of neck and upper extremities, full grip strength of upper extremities, no back tenderness.   Neurological: She is alert and oriented to person, place, and time.  Skin: Skin is warm and dry.  Psychiatric: She has a normal mood and affect. Her behavior is normal.  Nursing note and vitals reviewed.   ED Course  Procedures (including critical care time) DIAGNOSTIC STUDIES: Oxygen Saturation is 98% on RA, normal by my interpretation.    COORDINATION OF CARE: [id number] treatment plan with pt at bedside and pt agreed to plan.     Labs Review Labs Reviewed - No data to display  Imaging Review No results found.    EKG Interpretation None      MDM  Final diagnoses:  None   1. Muscle strain  Patient has neck pain after heavy lifting. No neurologic deficits or concern for radicular pain.    I personally performed the services described in this documentation, which was scribed in my presence. The recorded information has been reviewed and is accurate.       Elpidio AnisShari Lilburn Straw, PA-C 12/14/14 2233  Lyndal Pulleyaniel Knott, MD 12/19/14 1728

## 2014-12-13 NOTE — Discharge Instructions (Signed)
Heat Therapy °Heat therapy can help ease sore, stiff, injured, and tight muscles and joints. Heat relaxes your muscles, which may help ease your pain.  °RISKS AND COMPLICATIONS °If you have any of the following conditions, do not use heat therapy unless your health care provider has approved: °· Poor circulation. °· Healing wounds or scarred skin in the area being treated. °· Diabetes, heart disease, or high blood pressure. °· Not being able to feel (numbness) the area being treated. °· Unusual swelling of the area being treated. °· Active infections. °· Blood clots. °· Cancer. °· Inability to communicate pain. This may include young children and people who have problems with their brain function (dementia). °· Pregnancy. °Heat therapy should only be used on old, pre-existing, or long-lasting (chronic) injuries. Do not use heat therapy on new injuries unless directed by your health care provider. °HOW TO USE HEAT THERAPY °There are several different kinds of heat therapy, including: °· Moist heat pack. °· Warm water bath. °· Hot water bottle. °· Electric heating pad. °· Heated gel pack. °· Heated wrap. °· Electric heating pad. °Use the heat therapy method suggested by your health care provider. Follow your health care provider's instructions on when and how to use heat therapy. °GENERAL HEAT THERAPY RECOMMENDATIONS °· Do not sleep while using heat therapy. Only use heat therapy while you are awake. °· Your skin may turn pink while using heat therapy. Do not use heat therapy if your skin turns red. °· Do not use heat therapy if you have new pain. °· High heat or long exposure to heat can cause burns. Be careful when using heat therapy to avoid burning your skin. °· Do not use heat therapy on areas of your skin that are already irritated, such as with a rash or sunburn. °SEEK MEDICAL CARE IF: °· You have blisters, redness, swelling, or numbness. °· You have new pain. °· Your pain is worse. °MAKE SURE  YOU: °· Understand these instructions. °· Will watch your condition. °· Will get help right away if you are not doing well or get worse. °  °This information is not intended to replace advice given to you by your health care provider. Make sure you discuss any questions you have with your health care provider. °  °Document Released: 04/18/2011 Document Revised: 02/14/2014 Document Reviewed: 03/19/2013 °Elsevier Interactive Patient Education ©2016 [employer] °Muscle Strain °A muscle strain is an injury that occurs when a muscle is stretched beyond its normal length. Usually a small number of muscle fibers are torn when this happens. Muscle strain is rated in degrees. First-degree strains have the least amount of muscle fiber tearing and pain. Second-degree and third-degree strains have increasingly more tearing and pain.  °Usually, recovery from muscle strain takes 1-2 weeks. Complete healing takes 5-6 weeks.  °CAUSES  °Muscle strain happens when a sudden, violent force placed on a muscle stretches it too far. This may occur with lifting, sports, or a fall.  °RISK FACTORS °Muscle strain is especially common in athletes.  °SIGNS AND SYMPTOMS °At the site of the muscle strain, there may be: °· Pain. °· Bruising. °· Swelling. °· Difficulty using the muscle due to pain or lack of normal function. °DIAGNOSIS  °Your health care provider will perform a physical exam and ask about your medical history. °TREATMENT  °Often, the best treatment for a muscle strain is resting, icing, and applying cold compresses to the injured area.   °HOME CARE INSTRUCTIONS  °· Use the PRICE method   of treatment to promote muscle healing during the first 2-3 days after your injury. The PRICE method involves: °· Protecting the muscle from being injured again. °· Restricting your activity and resting the injured body part. °· Icing your injury. To do this, put ice in a plastic bag. Place a towel between your skin and the bag. Then, apply the ice  and leave it on from 15-20 minutes each hour. After the third day, switch to moist heat packs. °· Apply compression to the injured area with a splint or elastic bandage. Be careful not to wrap it too tightly. This may interfere with blood circulation or increase swelling. °· Elevate the injured body part above the level of your heart as often as you can. °· Only take over-the-counter or prescription medicines for pain, discomfort, or fever as directed by your health care provider. °· Warming up prior to exercise helps to prevent future muscle strains. °SEEK MEDICAL CARE IF:  °· You have increasing pain or swelling in the injured area. °· You have numbness, tingling, or a significant loss of strength in the injured area. °MAKE SURE YOU:  °· Understand these instructions. °· Will watch your condition. °· Will get help right away if you are not doing well or get worse. °  °This information is not intended to replace advice given to you by your health care provider. Make sure you discuss any questions you have with your health care provider. °  °Document Released: 01/24/2005 Document Revised: 11/14/2012 Document Reviewed: 08/23/2012 °Elsevier Interactive Patient Education ©2016 Elsevier Inc. ° °

## 2014-12-13 NOTE — ED Notes (Signed)
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  Pt shortly after developed neck stiffness.  Pain is rated 6/10, stiff and tender.

## 2014-12-15 ENCOUNTER — Ambulatory Visit: Payer: Medicaid Other | Admitting: Family Medicine

## 2014-12-23 ENCOUNTER — Ambulatory Visit (INDEPENDENT_AMBULATORY_CARE_PROVIDER_SITE_OTHER): Payer: Medicaid Other | Admitting: Family Medicine

## 2014-12-23 ENCOUNTER — Encounter: Payer: Self-pay | Admitting: Family Medicine

## 2014-12-23 VITALS — BP 107/65 | HR 76 | Temp 98.1°F | Wt 148.5 lb

## 2014-12-23 DIAGNOSIS — Z23 Encounter for immunization: Secondary | ICD-10-CM | POA: Diagnosis not present

## 2014-12-23 DIAGNOSIS — N760 Acute vaginitis: Secondary | ICD-10-CM

## 2014-12-23 DIAGNOSIS — A499 Bacterial infection, unspecified: Secondary | ICD-10-CM | POA: Diagnosis not present

## 2014-12-23 DIAGNOSIS — B9689 Other specified bacterial agents as the cause of diseases classified elsewhere: Secondary | ICD-10-CM | POA: Insufficient documentation

## 2014-12-23 DIAGNOSIS — L309 Dermatitis, unspecified: Secondary | ICD-10-CM | POA: Insufficient documentation

## 2014-12-23 MED ORDER — TRIAMCINOLONE ACETONIDE 0.025 % EX CREA
1.0000 | TOPICAL_CREAM | Freq: Two times a day (BID) | CUTANEOUS | Status: DC
Start: 2014-12-23 — End: 2015-02-28

## 2014-12-23 NOTE — Progress Notes (Signed)
    Subjective:  Brittney [name] is a 24 y.o. female who [facility] to establish care with a chief complaint of eczema.   HPI:  Eczema  Patient reports that she has had a long standing history of eczema since childhood. She usually has outbreaks on her back, neck, elbow, and abdomen. No known triggers, outbreaks seem to occur randomly. Recently switch to [employer] soap which has helped reduce the number of outbreaks. Reports that she barely used lotions. Outbreaks usually go away on their own. Has used a cortisone cream in the past, which she did not like using and felt like it did not help very much. Currently has an outbreak on her neck that is improving. No fevers or chills. No rashes anywhere else. No shortness of breath.   ROS: Per HPI, otherwise all systems reviewed and are negative  PMH:  The following were reviewed and entered/updated in epic: Past Medical History  Diagnosis Date  . Medical history non-contributory    Patient Active Problem List   Diagnosis Date Noted  . Bacterial vaginosis 12/23/2014  . Eczema 12/23/2014  . Vaginal candidiasis 10/03/2012   Past Surgical History  Procedure Laterality Date  . No past surgeries      Family History  Problem Relation Age of Onset  . Diabetes Other   . Hypertension Mother   . Hypertension Father     Medications- reviewed and updated Current Outpatient Prescriptions  Medication Sig Dispense Refill  . triamcinolone (KENALOG) 0.025 % cream Apply 1 application topically 2 (two) times daily. 30 g 0   No current facility-administered medications for this visit.    Allergies-reviewed and updated No Known Allergies  Social History   Social History  . Marital Status: Single    Spouse Name: N/A  . Number of Children: N/A  . Years of Education: N/A   Social History Main Topics  . Smoking status: Never Smoker   . Smokeless tobacco: Never Used  . Alcohol Use: No  . Drug Use: No  .  Sexual Activity:    Partners: Male    Birth Control/ Protection: None   Other Topics Concern  . None   Social History Narrative     Objective:  Physical Exam: BP 107/65 mmHg  Pulse 76  Temp(Src) 98.1 F (36.7 C) (Oral)  Wt 148 lb 8 oz (67.359 kg)  LMP 12/14/2014 (Exact Date)  Gen: NAD, resting comfortably HEENT: O/P clear, neck with FROM CV: RRR with no murmurs appreciated Lungs: NWOB, CTAB with no crackles, wheezes, or rhonchi GI: Normal bowel sounds present. Soft, Nontender, Nondistended. MSK: no edema, cyanosis, or clubbing noted Skin: Scaly patch approximately 5cm in diameter noted on left side of neck. Mildly erythematous. Consistent with eczema.  Neuro: grossly normal, moves all extremities Psych: Normal affect and thought content   Assessment/Plan:  Eczema Instructed patient to use daily lotion and apply vaseline as needed to the affected areas. Also recommended patient to avoid trying new soaps and detergents. Gave a prescription for triamcinalone cream to use as needed if not improving with above emollients.    Katina Degreealeb M. Jimmey RalphParker, MD [facility] Resident PGY-2 12/23/2014 3:00 PM

## 2014-12-23 NOTE — Patient Instructions (Signed)
Thank you for coming to the clinic today. It was nice meeting you!  For your eczema, we will prescribe a cream today. You should try using lotion or vaseline to area first. If this does not help, you can use a small amount of the cream. This cream and lighten the color of your skin, so be sure to only apply it when needed.  Please come back in 1 year for your annual physical, or sooner if you need anything else.

## 2014-12-23 NOTE — Assessment & Plan Note (Signed)
Instructed patient to use daily lotion and apply vaseline as needed to the affected areas. Also recommended patient to avoid trying new soaps and detergents. Gave a prescription for triamcinalone cream to use as needed if not improving with above emollients.

## 2015-02-18 ENCOUNTER — Encounter: Payer: Self-pay | Admitting: *Deleted

## 2015-02-18 ENCOUNTER — Other Ambulatory Visit (INDEPENDENT_AMBULATORY_CARE_PROVIDER_SITE_OTHER): Payer: Medicaid Other

## 2015-02-18 VITALS — BP 105/68 | HR 64 | Temp 98.3°F | Wt 145.0 lb

## 2015-02-18 DIAGNOSIS — Z3201 Encounter for pregnancy test, result positive: Secondary | ICD-10-CM | POA: Diagnosis not present

## 2015-02-18 DIAGNOSIS — N926 Irregular menstruation, unspecified: Secondary | ICD-10-CM | POA: Diagnosis not present

## 2015-02-18 DIAGNOSIS — Z3481 Encounter for supervision of other normal pregnancy, first trimester: Secondary | ICD-10-CM

## 2015-02-18 LAB — POCT URINE PREGNANCY: PREG TEST UR: POSITIVE — AB

## 2015-02-18 NOTE — Progress Notes (Signed)
Patient in office for a pregnancy test. Patient states she had a positive home pregnancy. Pregnancy Test in office is positive.   BP 105/68 mmHg  Pulse 64  Temp(Src) 98.3 F (36.8 C)  Wt 145 lb (65.772 kg)  LMP 01/10/2015

## 2015-02-18 NOTE — Addendum Note (Signed)
Addended by: Henriette CombsHATTON, ANDREA L on: 02/18/2015 04:03 PM   Modules accepted: Orders

## 2015-02-18 NOTE — Addendum Note (Signed)
Addended by: Henriette CombsHATTON, Ciara Kagan L on: 02/18/2015 03:45 PM   Modules accepted: Orders

## 2015-02-19 LAB — OBSTETRIC PANEL
Antibody Screen: NEGATIVE
BASOS PCT: 0 % (ref 0–1)
Basophils Absolute: 0 10*3/uL (ref 0.0–0.1)
Eosinophils Absolute: 0.1 10*3/uL (ref 0.0–0.7)
Eosinophils Relative: 1 % (ref 0–5)
HEMATOCRIT: 37.7 % (ref 36.0–46.0)
Hemoglobin: 12.6 g/dL (ref 12.0–15.0)
Hepatitis B Surface Ag: NEGATIVE
LYMPHS PCT: 44 % (ref 12–46)
Lymphs Abs: 2.8 10*3/uL (ref 0.7–4.0)
MCH: 30.4 pg (ref 26.0–34.0)
MCHC: 33.4 g/dL (ref 30.0–36.0)
MCV: 91.1 fL (ref 78.0–100.0)
MPV: 9.7 fL (ref 8.6–12.4)
Monocytes Absolute: 0.4 10*3/uL (ref 0.1–1.0)
Monocytes Relative: 6 % (ref 3–12)
NEUTROS PCT: 49 % (ref 43–77)
Neutro Abs: 3.1 10*3/uL (ref 1.7–7.7)
Platelets: 301 10*3/uL (ref 150–400)
RBC: 4.14 MIL/uL (ref 3.87–5.11)
RDW: 13.1 % (ref 11.5–15.5)
Rh Type: POSITIVE
Rubella: 2.99 Index — ABNORMAL HIGH (ref <0.90)
WBC: 6.3 10*3/uL (ref 4.0–10.5)

## 2015-02-19 LAB — HIV ANTIBODY (ROUTINE TESTING W REFLEX): HIV 1&2 Ab, 4th Generation: NONREACTIVE

## 2015-02-19 LAB — CULTURE, OB URINE

## 2015-02-19 LAB — TSH: TSH: 0.681 u[IU]/mL (ref 0.350–4.500)

## 2015-02-19 LAB — VITAMIN D 25 HYDROXY (VIT D DEFICIENCY, FRACTURES): VIT D 25 HYDROXY: 10 ng/mL — AB (ref 30–100)

## 2015-02-19 LAB — VARICELLA ZOSTER ANTIBODY, IGG: VARICELLA IGG: 2296 {index} — AB (ref <135.00)

## 2015-02-20 LAB — HEMOGLOBINOPATHY EVALUATION
Hemoglobin Other: 0 %
Hgb A2 Quant: 2.8 % (ref 2.2–3.2)
Hgb A: 97.2 % (ref 96.8–97.8)
Hgb F Quant: 0 % (ref 0.0–2.0)
Hgb S Quant: 0 %

## 2015-02-27 ENCOUNTER — Encounter (HOSPITAL_COMMUNITY): Payer: Self-pay | Admitting: Emergency Medicine

## 2015-02-27 DIAGNOSIS — Z7952 Long term (current) use of systemic steroids: Secondary | ICD-10-CM | POA: Diagnosis not present

## 2015-02-27 DIAGNOSIS — Z3A01 Less than 8 weeks gestation of pregnancy: Secondary | ICD-10-CM | POA: Insufficient documentation

## 2015-02-27 DIAGNOSIS — N898 Other specified noninflammatory disorders of vagina: Secondary | ICD-10-CM | POA: Diagnosis not present

## 2015-02-27 DIAGNOSIS — O009 Unspecified ectopic pregnancy without intrauterine pregnancy: Secondary | ICD-10-CM | POA: Insufficient documentation

## 2015-02-27 DIAGNOSIS — R1012 Left upper quadrant pain: Secondary | ICD-10-CM | POA: Insufficient documentation

## 2015-02-27 DIAGNOSIS — O9989 Other specified diseases and conditions complicating pregnancy, childbirth and the puerperium: Secondary | ICD-10-CM | POA: Diagnosis present

## 2015-02-27 LAB — CBC
HCT: 36 % (ref 36.0–46.0)
Hemoglobin: 12.3 g/dL (ref 12.0–15.0)
MCH: 31.4 pg (ref 26.0–34.0)
MCHC: 34.2 g/dL (ref 30.0–36.0)
MCV: 91.8 fL (ref 78.0–100.0)
PLATELETS: 268 10*3/uL (ref 150–400)
RBC: 3.92 MIL/uL (ref 3.87–5.11)
RDW: 12.8 % (ref 11.5–15.5)
WBC: 9.8 10*3/uL (ref 4.0–10.5)

## 2015-02-27 LAB — I-STAT BETA HCG BLOOD, ED (MC, WL, AP ONLY)

## 2015-02-27 NOTE — ED Notes (Signed)
Pt states she is [redacted] weeks pregnant.  C/o L sided abd cramping and vaginal bleeding with 2 small clots x 20 min.

## 2015-02-28 ENCOUNTER — Inpatient Hospital Stay (HOSPITAL_COMMUNITY)
Admission: EM | Admit: 2015-02-28 | Discharge: 2015-02-28 | Disposition: A | Discharge status: Other Acute Inpatient Hospital | Payer: Medicaid Other | Attending: Obstetrics | Admitting: Obstetrics

## 2015-02-28 ENCOUNTER — Encounter (HOSPITAL_COMMUNITY): Payer: Self-pay

## 2015-02-28 ENCOUNTER — Emergency Department (HOSPITAL_COMMUNITY): Payer: Medicaid Other

## 2015-02-28 ENCOUNTER — Inpatient Hospital Stay (HOSPITAL_COMMUNITY): Payer: Medicaid Other

## 2015-02-28 DIAGNOSIS — O009 Unspecified ectopic pregnancy without intrauterine pregnancy: Secondary | ICD-10-CM

## 2015-02-28 DIAGNOSIS — B9689 Other specified bacterial agents as the cause of diseases classified elsewhere: Secondary | ICD-10-CM

## 2015-02-28 DIAGNOSIS — R109 Unspecified abdominal pain: Secondary | ICD-10-CM

## 2015-02-28 DIAGNOSIS — N760 Acute vaginitis: Secondary | ICD-10-CM

## 2015-02-28 DIAGNOSIS — O26899 Other specified pregnancy related conditions, unspecified trimester: Secondary | ICD-10-CM

## 2015-02-28 DIAGNOSIS — O209 Hemorrhage in early pregnancy, unspecified: Secondary | ICD-10-CM

## 2015-02-28 LAB — URINE MICROSCOPIC-ADD ON: WBC, UA: NONE SEEN WBC/hpf (ref 0–5)

## 2015-02-28 LAB — URINALYSIS, ROUTINE W REFLEX MICROSCOPIC
Bilirubin Urine: NEGATIVE
Glucose, UA: NEGATIVE mg/dL
Ketones, ur: NEGATIVE mg/dL
LEUKOCYTES UA: NEGATIVE
NITRITE: NEGATIVE
PH: 6.5 (ref 5.0–8.0)
Protein, ur: NEGATIVE mg/dL
SPECIFIC GRAVITY, URINE: 1.022 (ref 1.005–1.030)

## 2015-02-28 LAB — LIPASE, BLOOD: Lipase: 19 U/L (ref 11–51)

## 2015-02-28 LAB — COMPREHENSIVE METABOLIC PANEL
ALK PHOS: 47 U/L (ref 38–126)
ALT: 15 U/L (ref 14–54)
AST: 17 U/L (ref 15–41)
Albumin: 3.7 g/dL (ref 3.5–5.0)
Anion gap: 10 (ref 5–15)
BILIRUBIN TOTAL: 0.6 mg/dL (ref 0.3–1.2)
BUN: 9 mg/dL (ref 6–20)
CALCIUM: 9.5 mg/dL (ref 8.9–10.3)
CO2: 25 mmol/L (ref 22–32)
CREATININE: 0.91 mg/dL (ref 0.44–1.00)
Chloride: 103 mmol/L (ref 101–111)
GFR calc Af Amer: >60 mL/min (ref >60)
GFR calc non Af Amer: >60 mL/min (ref >60)
Glucose, Bld: 90 mg/dL (ref 65–99)
Potassium: 3.6 mmol/L (ref 3.5–5.1)
SODIUM: 138 mmol/L (ref 135–145)
TOTAL PROTEIN: 7.1 g/dL (ref 6.5–8.1)

## 2015-02-28 LAB — WET PREP, GENITAL
SPERM: NONE SEEN
TRICH WET PREP: NONE SEEN
WBC WET PREP: NONE SEEN
YEAST WET PREP: NONE SEEN

## 2015-02-28 LAB — HCG, QUANTITATIVE, PREGNANCY: hCG, Beta Chain, Quant, S: 6126 m[IU]/mL — ABNORMAL HIGH (ref <5)

## 2015-02-28 SURGERY — Surgical Case
Anesthesia: *Unknown

## 2015-02-28 MED ORDER — METHOTREXATE INJECTION FOR WOMEN'S HOSPITAL
50.0000 mg/m2 | Freq: Once | INTRAMUSCULAR | Status: AC
  Administered 2015-02-28: 85 mg via INTRAMUSCULAR
  Filled 2015-02-28: qty 1.7

## 2015-02-28 MED ORDER — METRONIDAZOLE 500 MG PO TABS: 500.0000 mg | ORAL_TABLET | Freq: Two times a day (BID) | ORAL | Status: DC

## 2015-02-28 NOTE — Discharge Instructions (Signed)
Methotrexate Treatment for an Ectopic Pregnancy °Methotrexate is a medicine that treats ectopic pregnancy by stopping the growth of the fertilized egg. It also helps your body absorb tissue from the egg. This takes between 2 weeks and 6 weeks. Most ectopic pregnancies can be successfully treated with methotrexate if they are detected early enough. °LET YOUR HEALTH CARE PROVIDER KNOW ABOUT: °· Any allergies you have. °· All medicines you are taking, including vitamins, herbs, eye drops, creams, and over-the-counter medicines. °· Medical conditions you have. °RISKS AND COMPLICATIONS °Generally, this is a safe treatment. However, as with any treatment, problems can occur. Possible problems or side effects include: °· Nausea. °· Vomiting. °· Diarrhea. °· Abdominal cramping. °· Mouth sores. °· Increased vaginal bleeding or spotting.   °· Swelling or irritation of the lining of your lungs (pneumonitis).  °· Failed treatment and continuation of the pregnancy.   °· Liver damage. °· Hair loss. °There is still a risk of the ectopic pregnancy rupturing while using the methotrexate. °BEFORE THE PROCEDURE °Before you take the medicine:  °· Liver tests, kidney tests, and a complete blood test are performed. °· Blood tests are performed to measure the pregnancy hormone levels and to determine your blood type. °· If you are Rh-negative and the father is Rh-positive or his Rh type is not known, you will be given a Rho (D) immune globulin shot. °PROCEDURE  °There are two methods that your health care provider may use to prescribe methotrexate. One method involves a single dose or injection of the medicine. Another method involves a series of doses given through several injections.  °AFTER THE PROCEDURE °· You may have some abdominal cramping, vaginal bleeding, and fatigue in the first few days after taking methotrexate. °· Blood tests will be taken for several weeks to check the pregnancy hormone levels. The blood tests are performed  until there is no more pregnancy hormone detected in the blood. °  °This information is not intended to replace advice given to you by your health care provider. Make sure you discuss any questions you have with your health care provider. °  °Document Released: 01/18/2001 Document Revised: 02/14/2014 Document Reviewed: 11/12/2012 °Elsevier Interactive Patient Education ©2016 [employer] ° °Ectopic Pregnancy °An ectopic pregnancy is when the fertilized egg attaches (implants) outside the uterus. Most ectopic pregnancies occur in the fallopian tube. Rarely do ectopic pregnancies occur on the ovary, intestine, pelvis, or cervix. In an ectopic pregnancy, the fertilized egg does not have the ability to develop into a normal, healthy baby.  °A ruptured ectopic pregnancy is one in which the fallopian tube gets torn or bursts and results in internal bleeding. Often there is intense abdominal pain, and sometimes, vaginal bleeding. Having an ectopic pregnancy can be life threatening. If left untreated, this dangerous condition can lead to a blood transfusion, abdominal surgery, or even death. °CAUSES  °Damage to the fallopian tubes is the suspected cause in most ectopic pregnancies.  °RISK FACTORS °Depending on your circumstances, the risk of having an ectopic pregnancy will vary. The level of risk can be divided into three categories. °High Risk °· You have gone through infertility treatment. °· You have had a previous ectopic pregnancy. °· You have had previous tubal surgery. °· You have had previous surgery to have the fallopian tubes tied (tubal ligation). °· You have tubal problems or diseases. °· You have been exposed to DES. DES is a medicine that was used until 1971 and had effects on babies whose mothers took the medicine. °·   You become pregnant while using an intrauterine device (IUD) for birth control.  °Moderate Risk °· You have a history of infertility. °· You have a history of a sexually transmitted infection  (STI). °· You have a history of pelvic inflammatory disease (PID). °· You have scarring from endometriosis. °· You have multiple sexual partners. °· You smoke.  °Low Risk °· You have had previous pelvic surgery. °· You use vaginal douching. °· You became sexually active before 27 years of age. °SIGNS AND SYMPTOMS  °An ectopic pregnancy should be suspected in anyone who has missed a period and has abdominal pain or bleeding. °· You may experience normal pregnancy symptoms, such as: °¨ Nausea. °¨ Tiredness. °¨ Breast tenderness. °· Other symptoms may include: °¨ Pain with intercourse. °¨ Irregular vaginal bleeding or spotting. °¨ Cramping or pain on one side or in the lower abdomen. °¨ Fast heartbeat. °¨ Passing out while having a bowel movement. °· Symptoms of a ruptured ectopic pregnancy and internal bleeding may include: °¨ Sudden, severe pain in the abdomen and pelvis. °¨ Dizziness or fainting. °¨ Pain in the shoulder area. °DIAGNOSIS  °Tests that may be performed include: °· A pregnancy test. °· An ultrasound test. °· Testing the specific level of pregnancy hormone in the bloodstream. °· Taking a sample of uterus tissue (dilation and curettage, D&C). °· Surgery to perform a visual exam of the inside of the abdomen using a thin, lighted tube with a tiny camera on the end (laparoscope). °TREATMENT  °An injection of a medicine called methotrexate may be given. This medicine causes the pregnancy tissue to be absorbed. It is given if: °· The diagnosis is made early. °· The fallopian tube has not ruptured. °· You are considered to be a good candidate for the medicine. °Usually, pregnancy hormone blood levels are checked after methotrexate treatment. This is to be sure the medicine is effective. It may take 4-6 weeks for the pregnancy to be absorbed (though most pregnancies will be absorbed by 3 weeks). °Surgical treatment may be needed. A laparoscope may be used to remove the pregnancy tissue. If severe internal  bleeding occurs, a cut (incision) may be made in the lower abdomen (laparotomy), and the ectopic pregnancy is removed. This stops the bleeding. Part of the fallopian tube, or the whole tube, may be removed as well (salpingectomy). After surgery, pregnancy hormone tests may be done to be sure there is no pregnancy tissue left. You may receive a Rho (D) immune globulin shot if you are Rh negative and the father is Rh positive, or if you do not know the Rh type of the father. This is to prevent problems with any future pregnancy. °SEEK IMMEDIATE MEDICAL CARE IF:  °You have any symptoms of an ectopic pregnancy. This is a medical emergency. °MAKE SURE YOU: °· Understand these instructions. °· Will watch your condition. °· Will get help right away if you are not doing well or get worse. °  °This information is not intended to replace advice given to you by your health care provider. Make sure you discuss any questions you have with your health care provider. °  °Document Released: 03/03/2004 Document Revised: 02/14/2014 Document Reviewed: 08/23/2012 °Elsevier Interactive Patient Education ©2016 Elsevier Inc. ° °

## 2015-02-28 NOTE — MAU Provider Note (Signed)
History     CSN: [id number]  Arrival date and time: 02/27/15 2250   First Provider Initiated Contact with Patient 02/28/15 0449      Chief Complaint  Patient presents with  . pregnant/vaginal bleeding    HPI  [name] is a 24 y.o. female G3P1102 at [redacted]w[redacted]d presenting to MAU from [facility] with a left adnexal mass. Dr.  had this patient transferred here for further evaluation. The patient originally present to [facility] with vaginal bleeding and abdominal pain.    OB History    Gravida Para Term Preterm AB TAB SAB Ectopic Multiple Living   3 2 1 1      2      Past Medical History  Diagnosis Date  . Medical history non-contributory     Past Surgical History  Procedure Laterality Date  . No past surgeries      Family History  Problem Relation Age of Onset  . Diabetes Other   . Hypertension Mother   . Hypertension Father     Social History  Substance Use Topics  . Smoking status: Never Smoker   . Smokeless tobacco: Never Used  . Alcohol Use: No    Allergies: No Known Allergies  Prescriptions prior to admission  Medication Sig Dispense Refill Last Dose  . Prenatal Vit-Fe Fumarate-FA (PRENATAL MULTIVITAMIN) TABS tablet Take 1 tablet by mouth daily at 12 noon.   Past Week at Unknown time   Results for orders placed or performed during the hospital encounter of 02/28/15 (from the past 48 hour(s))  hCG, quantitative, pregnancy     Status: Abnormal   Collection Time: 02/27/15  3:28 AM  Result Value Ref Range   hCG, Beta Chain, Quant, S 6126 (H) <5 mIU/mL    Comment:          GEST. AGE      CONC.  (mIU/mL)   <=1 WEEK        5 - 50     2 WEEKS       50 - 500     3 WEEKS       100 - 10,000     4 WEEKS     1,000 - 30,000     5 WEEKS     3,500 - 115,000   6-8 WEEKS     12,000 - 270,000    12 WEEKS     15,000 - 220,000        FEMALE AND NON-PREGNANT FEMALE:     LESS THAN 5 mIU/mL   Urinalysis, Routine w reflex microscopic (not at [facility])      Status: Abnormal   Collection Time: 02/27/15 11:12 PM  Result Value Ref Range   Color, Urine YELLOW YELLOW   APPearance CLEAR CLEAR   Specific Gravity, Urine 1.022 1.005 - 1.030   pH 6.5 5.0 - 8.0   Glucose, UA NEGATIVE NEGATIVE mg/dL   Hgb urine dipstick LARGE (A) NEGATIVE   Bilirubin Urine NEGATIVE NEGATIVE   Ketones, ur NEGATIVE NEGATIVE mg/dL   Protein, ur NEGATIVE NEGATIVE mg/dL   Nitrite NEGATIVE NEGATIVE   Leukocytes, UA NEGATIVE NEGATIVE  Urine microscopic-add on     Status: Abnormal   Collection Time: 02/27/15 11:12 PM  Result Value Ref Range   Squamous Epithelial / LPF 0-5 (A) NONE SEEN   WBC, UA NONE SEEN 0 - 5 WBC/hpf   RBC / HPF 0-5 0 - 5 RBC/hpf   Bacteria, UA RARE (A) NONE SEEN    I-Stat beta hCG blood, ED ([facility], [facility], [facility] only)     Status: Abnormal   Collection Time: 02/27/15 11:38 PM  Result Value Ref Range   I-stat hCG, quantitative >2000.0 (H) <5 mIU/mL   Comment 3            Comment:   GEST. AGE      CONC.  (mIU/mL)   <=1 WEEK        5 - 50     2 WEEKS       50 - 500     3 WEEKS       100 - 10,000     4 WEEKS     1,000 - 30,000        FEMALE AND NON-PREGNANT FEMALE:     LESS THAN 5 mIU/mL   Lipase, blood     Status: None   Collection Time: 02/27/15 11:45 PM  Result Value Ref Range   Lipase 19 11 - 51 U/L  Comprehensive metabolic panel     Status: None   Collection Time: 02/27/15 11:45 PM  Result Value Ref Range   Sodium 138 135 - 145 mmol/L   Potassium 3.6 3.5 - 5.1 mmol/L   Chloride 103 101 - 111 mmol/L   CO2 25 22 - 32 mmol/L   Glucose, Bld 90 65 - 99 mg/dL   BUN 9 6 - 20 mg/dL   Creatinine, Ser 0.91 0.44 - 1.00 mg/dL   Calcium 9.5 8.9 - 10.3 mg/dL   Total Protein 7.1 6.5 - 8.1 g/dL   Albumin 3.7 3.5 - 5.0 g/dL   AST 17 15 - 41 U/L   ALT 15 14 - 54 U/L   Alkaline Phosphatase 47 38 - 126 U/L   Total Bilirubin 0.6 0.3 - 1.2 mg/dL   GFR calc non Af Amer >60 >60 mL/min   GFR calc Af Amer >60 >60 mL/min    Comment: (NOTE) The eGFR has been calculated  using the CKD EPI equation. This calculation has not been validated in all clinical situations. eGFR's persistently <60 mL/min signify possible Chronic Kidney Disease.    Anion gap 10 5 - 15  CBC     Status: None   Collection Time: 02/27/15 11:45 PM  Result Value Ref Range   WBC 9.8 4.0 - 10.5 K/uL   RBC 3.92 3.87 - 5.11 MIL/uL   Hemoglobin 12.3 12.0 - 15.0 g/dL   HCT 36.0 36.0 - 46.0 %   MCV 91.8 78.0 - 100.0 fL   MCH 31.4 26.0 - 34.0 pg   MCHC 34.2 30.0 - 36.0 g/dL   RDW 12.8 11.5 - 15.5 %   Platelets 268 150 - 400 K/uL  Wet prep, genital     Status: Abnormal   Collection Time: 02/28/15  1:20 AM  Result Value Ref Range   Yeast Wet Prep HPF POC NONE SEEN NONE SEEN   Trich, Wet Prep NONE SEEN NONE SEEN   Clue Cells Wet Prep HPF POC PRESENT (A) NONE SEEN   WBC, Wet Prep HPF POC NONE SEEN NONE SEEN   Sperm NONE SEEN    US Ob Comp Less 14 Wks  02/28/2015  CLINICAL DATA:  25 year old female with positive pregnancy test presenting with vaginal bleeding. EXAM: OBSTETRIC <14 WK [place] AND TRANSVAGINAL OB US TECHNIQUE: Both transabdominal and transvaginal ultrasound examinations were performed for complete evaluation of the gestation as well as the maternal uterus, adnexal regions, and pelvic cul-de-sac. Transvaginal technique was performed to  assess early pregnancy. COMPARISON:  None for this pregnancy FINDINGS: The uterus is retroverted and heterogeneous. No intrauterine pregnancy identified. The endometrium measures 7 mm in thickness. The right ovary appears unremarkable and measures 2.8 x 1.4 x 2.6 cm. The left ovary measures 3.3 x 2.3 x 2.6 cm. There is a 1.4 x 1.3 x 1.6 cm complex cystic structure medial to the left ovary. This structure is appears to be separate from the left ovary and highly concerning for an ectopic pregnancy. A 2 mm linear echogenicity within this cystic structure may represent a fetal pole. If this structure is a fetal pole the estimated gestational age based on crown-rump  length of 2 mm is 5 weeks, 5 days. No significant free fluid noted within the pelvis. IMPRESSION: No intrauterine pregnancy identified. Left adnexal mass highly suspicious for an ectopic pregnancy. Clinical correlation and obstetrical consult is advised. These results were called by telephone at the time of interpretation on 02/28/2015 at 2:35 am to Dr. Allen, who verbally acknowledged these results. Electronically Signed   By: Arash  Radparvar M.D.   On: 02/28/2015 02:37   Us Ob Transvaginal  02/28/2015  CLINICAL DATA:  24-year-old female with positive pregnancy test presenting with vaginal bleeding. EXAM: TRANSVAGINAL OB ULTRASOUND TECHNIQUE: Transvaginal ultrasound was performed for complete evaluation of the gestation as well as the maternal uterus, adnexal regions, and pelvic cul-de-sac. COMPARISON:  Ultrasound dated 02/28/2015 FINDINGS: No intrauterine pregnancy identified. The ovaries appear unremarkable. There is a 1.9 x 1.8 x 1.9 cm complex mass in the region of the left adnexa adjacent to the left ovary which appears to contain a gestational sac. A yolk sac and fetal pole are identified within the sac. The crown-rump length is approximately 4 mm. No definite fetal cardiac activity identified. The estimated gestational age based on crown-rump length of 4 mm is 6 weeks, 0 days. IMPRESSION: Complex left adnexal mass containing a yolk sac and fetal pole compatible with an ectopic pregnancy. No definite fetal cardiac activity identified. These results were called by telephone at the time of interpretation on 02/28/2015 at 6:36 am to Dr. JENNIFER RASCH , who verbally acknowledged these results. Electronically Signed   By: Arash  Radparvar M.D.   On: 02/28/2015 06:38   Us Ob Transvaginal  02/28/2015  CLINICAL DATA:  24-year-old female with positive pregnancy test presenting with vaginal bleeding. EXAM: OBSTETRIC <14 WK US AND TRANSVAGINAL OB US TECHNIQUE: Both transabdominal and transvaginal ultrasound  examinations were performed for complete evaluation of the gestation as well as the maternal uterus, adnexal regions, and pelvic cul-de-sac. Transvaginal technique was performed to assess early pregnancy. COMPARISON:  None for this pregnancy FINDINGS: The uterus is retroverted and heterogeneous. No intrauterine pregnancy identified. The endometrium measures 7 mm in thickness. The right ovary appears unremarkable and measures 2.8 x 1.4 x 2.6 cm. The left ovary measures 3.3 x 2.3 x 2.6 cm. There is a 1.4 x 1.3 x 1.6 cm complex cystic structure medial to the left ovary. This structure is appears to be separate from the left ovary and highly concerning for an ectopic pregnancy. A 2 mm linear echogenicity within this cystic structure may represent a fetal pole. If this structure is a fetal pole the estimated gestational age based on crown-rump length of 2 mm is 5 weeks, 5 days. No significant free fluid noted within the pelvis. IMPRESSION: No intrauterine pregnancy identified. Left adnexal mass highly suspicious for an ectopic pregnancy. Clinical correlation and obstetrical consult is advised. These results were   called by telephone at the time of interpretation on 02/28/2015 at 2:35 am to Dr. Zenia Resides, who verbally acknowledged these results. Electronically Signed   By: Anner Crete M.D.   On: 02/28/2015 02:37    Review of Systems  Constitutional: Negative for fever and chills.  Gastrointestinal: Positive for abdominal pain. Negative for nausea and vomiting.   Physical Exam   Blood pressure 121/66, pulse 93, temperature 97.9 F (36.6 C), temperature source Oral, resp. rate 16, last menstrual period 01/10/2015, SpO2 99 %.  Physical Exam  Constitutional: She is oriented to person, place, and time. She appears well-developed and well-nourished.  Non-toxic appearance. She does not have a sickly appearance. She does not appear ill. No distress.  HENT:  Head: Normocephalic.  GI: Soft. She exhibits no distension  and no mass. There is no tenderness. There is no rebound and no guarding.  Musculoskeletal: Normal range of motion.  Neurological: She is alert and oriented to person, place, and time.  Skin: Skin is warm. She is not diaphoretic.    MAU Course  Procedures  None  MDM Discussed [place] with Dr. Jodi Mourning @ 0505; discussed quant of 6100,  ?Fetal pole on [place]. Dr. Jodi Mourning reviewed [place] images and report from [facility].  Dr. Jodi Mourning would like the patient re-scanned here at [facility]   Discussed US findings and quant with Dr. Elly Modena _0 . Relative contraindications of MTX use discussed and reviewed.   Discussed MTX option with the patient who is agreeable to the plan of care. Due to her elevated Hcg levels her risk of MTX failure is greater and may warrant a second dose of MTX; patient is aware and states understanding.  AST, BUN, CBC and creatinine reviewed   0630: spoke to Dr. Jodi Mourning; MTX recommended and ordered.   Assessment and Plan   A:  Left adnexal mass.  Bacterial vaginosis    P:  Discharge home in stable condition  Strict ectopic precautions  Return to MAU on Day 4 (Tues) for beta hcg level or sooner if pain worsens Pelvic rest Ok to take tylenol as directed on the bottle.  RX: flagyl    Lezlie Lye, NP 02/28/2015 6:38 AM

## 2015-02-28 NOTE — ED Notes (Signed)
Patient transported to Ultrasound 

## 2015-02-28 NOTE — Progress Notes (Signed)
Discussed D/C instrs at length. Discussed importance of F/U and returning sooner with mod-severe pain.

## 2015-02-28 NOTE — ED Provider Notes (Signed)
CSN: [id number]     Arrival date & time 02/27/15  2250 History  By signing my name below, I, Octavia Heir, attest that this documentation has been prepared under the direction and in the presence of Lorre Nick, MD. Electronically Signed: Octavia Heir, ED Scribe. 02/28/2015. 12:58 AM.    Chief Complaint  Patient presents with  . pregnant/vaginal bleeding       The history is provided by the patient. No language interpreter was used.   HPI Comments: Brittney [name] is a 25 y.o. female who is [redacted] weeks pregnant presents to the Emergency Department complaining of sudden onset, intermittent, vaginal bleeding onset this evening with associated LUQ abdominal cramping. Pt reports seeing two small blood clots in her vaginal bleeding. Pt states this is her 3rd pregnancy. She was seen by OB but did not have an ultrasound. Cramping has been colicky and w/o urinary sx. No flank pain. Denies fever or emesis. No tx used pta  Past Medical History  Diagnosis Date  . Medical history non-contributory    Past Surgical History  Procedure Laterality Date  . No past surgeries     Family History  Problem Relation Age of Onset  . Diabetes Other   . Hypertension Mother   . Hypertension Father    Social History  Substance Use Topics  . Smoking status: Never Smoker   . Smokeless tobacco: Never Used  . Alcohol Use: No   OB History    Gravida Para Term Preterm AB TAB SAB Ectopic Multiple Living   Review of Systems  Gastrointestinal: Positive for abdominal pain.  Genitourinary: Positive for vaginal bleeding.  All other systems reviewed and are negative.     Allergies  Review of patient's allergies indicates no known allergies.  Home Medications   Prior to Admission medications   Medication Sig Start Date End Date Taking? Authorizing Provider  triamcinolone (KENALOG) 0.025 % cream Apply 1 application topically 2 (two) times daily. 12/23/14   Ardith Dark, MD    Triage vitals: BP 123/81 mmHg  Pulse 82  Temp(Src) 97.9 F (36.6 C) (Oral)  Resp 18  SpO2 100%  LMP 01/10/2015 Physical Exam  Constitutional: She is oriented to person, place, and time. She appears well-developed and well-nourished.  Non-toxic appearance. No distress.  HENT:  Head: Normocephalic and atraumatic.  Eyes: Conjunctivae, EOM and lids are normal. Pupils are equal, round, and reactive to light.  Neck: Normal range of motion. Neck supple. No tracheal deviation present. No thyroid mass present.  Cardiovascular: Normal rate, regular rhythm and normal heart sounds.  Exam reveals no gallop.   No murmur heard. Pulmonary/Chest: Effort normal and breath sounds normal. No stridor. No respiratory distress. She has no decreased breath sounds. She has no wheezes. She has no rhonchi. She has no rales.  Abdominal: Soft. Normal appearance and bowel sounds are normal. She exhibits no distension. There is no tenderness. There is no rebound and no CVA tenderness.  Genitourinary: No bleeding in the vagina. Vaginal discharge found.  Musculoskeletal: Normal range of motion. She exhibits no edema or tenderness.  Neurological: She is alert and oriented to person, place, and time. She has normal strength. No cranial nerve deficit or sensory deficit. GCS eye subscore is 4. GCS verbal subscore is 5. GCS motor subscore is 6.  Skin: Skin is warm and dry. No abrasion and no rash noted.  Psychiatric: She has a normal  mood and affect. Her speech is normal and behavior is normal.  Nursing note and vitals reviewed.   ED Course  Procedures  DIAGNOSTIC STUDIES: Oxygen Saturation is 100% on RA, normal by my interpretation.  COORDINATION OF CARE:  12:58 AM Discussed treatment plan which includes ultrasound and pelvic exam with pt at bedside and pt agreed to plan.  Labs Review Labs Reviewed  URINALYSIS, ROUTINE W REFLEX MICROSCOPIC (NOT AT [facility]) - Abnormal; Notable for the following:    Hgb urine dipstick  LARGE (*)    All other components within normal limits  URINE MICROSCOPIC-ADD ON - Abnormal; Notable for the following:    Squamous Epithelial / LPF 0-5 (*)    Bacteria, UA RARE (*)    All other components within normal limits  I-STAT BETA HCG BLOOD, ED ([facility], [facility], [facility] ONLY) - Abnormal; Notable for the following:    I-stat hCG, quantitative >2000.0 (*)    All other components within normal limits  LIPASE, BLOOD  COMPREHENSIVE METABOLIC PANEL  CBC    Imaging Review No results found. I have personally reviewed and evaluated these images and lab results as part of my medical decision-making.   EKG Interpretation None      MDM   Final diagnoses:  None   I personally performed the services described in this documentation, which was scribed in my presence. The recorded information has been reviewed and is accurate.   3:13 AM Discuss case with Dr. Clearance Coots, the patient's gynecologist, and he has accepted the patient transferred to [facility] hospital. Blood pressure and bowel signs stable here prior to transfer  Lorre Nick, MD 02/28/15 [phone]

## 2015-02-28 NOTE — MAU Note (Signed)
Cramping lower abd on left side since 4 pm.  At 10 pm, passed clots less than dime size x2.  Bleeding like period, used 2 pads.  No bleeding now just cramps.

## 2015-03-02 LAB — GC/CHLAMYDIA PROBE AMP (~~LOC~~) NOT AT ARMC
Chlamydia: NEGATIVE
Neisseria Gonorrhea: NEGATIVE

## 2015-03-03 ENCOUNTER — Inpatient Hospital Stay (HOSPITAL_COMMUNITY)
Admission: AD | Admit: 2015-03-03 | Discharge: 2015-03-03 | Disposition: A | Discharge status: Home / Self Care | Payer: Medicaid Other | Source: Ambulatory Visit | Attending: Obstetrics | Admitting: Obstetrics

## 2015-03-03 DIAGNOSIS — O001 Tubal pregnancy without intrauterine pregnancy: Secondary | ICD-10-CM

## 2015-03-03 DIAGNOSIS — O009 Unspecified ectopic pregnancy without intrauterine pregnancy: Secondary | ICD-10-CM | POA: Insufficient documentation

## 2015-03-03 DIAGNOSIS — O00109 Unspecified tubal pregnancy without intrauterine pregnancy: Secondary | ICD-10-CM

## 2015-03-03 LAB — HCG, QUANTITATIVE, PREGNANCY: hCG, Beta Chain, Quant, S: 8150 m[IU]/mL — ABNORMAL HIGH (ref <5)

## 2015-03-03 NOTE — Discharge Instructions (Signed)
Ectopic Pregnancy °An ectopic pregnancy is when the fertilized egg attaches (implants) outside the uterus. Most ectopic pregnancies occur in the fallopian tube. Rarely do ectopic pregnancies occur on the ovary, intestine, pelvis, or cervix. In an ectopic pregnancy, the fertilized egg does not have the ability to develop into a normal, healthy baby.  °A ruptured ectopic pregnancy is one in which the fallopian tube gets torn or bursts and results in internal bleeding. Often there is intense abdominal pain, and sometimes, vaginal bleeding. Having an ectopic pregnancy can be life threatening. If left untreated, this dangerous condition can lead to a blood transfusion, abdominal surgery, or even death. °CAUSES  °Damage to the fallopian tubes is the suspected cause in most ectopic pregnancies.  °RISK FACTORS °Depending on your circumstances, the risk of having an ectopic pregnancy will vary. The level of risk can be divided into three categories. °High Risk °· You have gone through infertility treatment. °· You have had a previous ectopic pregnancy. °· You have had previous tubal surgery. °· You have had previous surgery to have the fallopian tubes tied (tubal ligation). °· You have tubal problems or diseases. °· You have been exposed to DES. DES is a medicine that was used until 1971 and had effects on babies whose mothers took the medicine. °· You become pregnant while using an intrauterine device (IUD) for birth control.  °Moderate Risk °· You have a history of infertility. °· You have a history of a sexually transmitted infection (STI). °· You have a history of pelvic inflammatory disease (PID). °· You have scarring from endometriosis. °· You have multiple sexual partners. °· You smoke.  °Low Risk °· You have had previous pelvic surgery. °· You use vaginal douching. °· You became sexually active before 27 years of age. °SIGNS AND SYMPTOMS  °An ectopic pregnancy should be suspected in anyone who has missed a period and  has abdominal pain or bleeding. °· You may experience normal pregnancy symptoms, such as: °· Nausea. °· Tiredness. °· Breast tenderness. °· Other symptoms may include: °· Pain with intercourse. °· Irregular vaginal bleeding or spotting. °· Cramping or pain on one side or in the lower abdomen. °· Fast heartbeat. °· Passing out while having a bowel movement. °· Symptoms of a ruptured ectopic pregnancy and internal bleeding may include: °· Sudden, severe pain in the abdomen and pelvis. °· Dizziness or fainting. °· Pain in the shoulder area. °DIAGNOSIS  °Tests that may be performed include: °· A pregnancy test. °· An ultrasound test. °· Testing the specific level of pregnancy hormone in the bloodstream. °· Taking a sample of uterus tissue (dilation and curettage, D&C). °· Surgery to perform a visual exam of the inside of the abdomen using a thin, lighted tube with a tiny camera on the end (laparoscope). °TREATMENT  °An injection of a medicine called methotrexate may be given. This medicine causes the pregnancy tissue to be absorbed. It is given if: °· The diagnosis is made early. °· The fallopian tube has not ruptured. °· You are considered to be a good candidate for the medicine. °Usually, pregnancy hormone blood levels are checked after methotrexate treatment. This is to be sure the medicine is effective. It may take 4-6 weeks for the pregnancy to be absorbed (though most pregnancies will be absorbed by 3 weeks). °Surgical treatment may be needed. A laparoscope may be used to remove the pregnancy tissue. If severe internal bleeding occurs, a cut (incision) may be made in the lower abdomen (laparotomy), and the ectopic   pregnancy is removed. This stops the bleeding. Part of the fallopian tube, or the whole tube, may be removed as well (salpingectomy). After surgery, pregnancy hormone tests may be done to be sure there is no pregnancy tissue left. You may receive a Rho (D) immune globulin shot if you are Rh negative and  the father is Rh positive, or if you do not know the Rh type of the father. This is to prevent problems with any future pregnancy. °SEEK IMMEDIATE MEDICAL CARE IF:  °You have any symptoms of an ectopic pregnancy. This is a medical emergency. °MAKE SURE YOU: °· Understand these instructions. °· Will watch your condition. °· Will get help right away if you are not doing well or get worse. °  °This information is not intended to replace advice given to you by your health care provider. Make sure you discuss any questions you have with your health care provider. °  °Document Released: 03/03/2004 Document Revised: 02/14/2014 Document Reviewed: 08/23/2012 °Elsevier Interactive Patient Education ©2016 [employer] °Methotrexate Treatment for an Ectopic Pregnancy °Methotrexate is a medicine that treats ectopic pregnancy by stopping the growth of the fertilized egg. It also helps your body absorb tissue from the egg. This takes between 2 weeks and 6 weeks. Most ectopic pregnancies can be successfully treated with methotrexate if they are detected early enough. °LET YOUR HEALTH CARE PROVIDER KNOW ABOUT: °· Any allergies you have. °· All medicines you are taking, including vitamins, herbs, eye drops, creams, and over-the-counter medicines. °· Medical conditions you have. °RISKS AND COMPLICATIONS °Generally, this is a safe treatment. However, as with any treatment, problems can occur. Possible problems or side effects include: °· Nausea. °· Vomiting. °· Diarrhea. °· Abdominal cramping. °· Mouth sores. °· Increased vaginal bleeding or spotting.   °· Swelling or irritation of the lining of your lungs (pneumonitis).  °· Failed treatment and continuation of the pregnancy.   °· Liver damage. °· Hair loss. °There is still a risk of the ectopic pregnancy rupturing while using the methotrexate. °BEFORE THE PROCEDURE °Before you take the medicine:  °· Liver tests, kidney tests, and a complete blood test are performed. °· Blood tests  are performed to measure the pregnancy hormone levels and to determine your blood type. °· If you are Rh-negative and the father is Rh-positive or his Rh type is not known, you will be given a Rho (D) immune globulin shot. °PROCEDURE  °There are two methods that your health care provider may use to prescribe methotrexate. One method involves a single dose or injection of the medicine. Another method involves a series of doses given through several injections.  °AFTER THE PROCEDURE °· You may have some abdominal cramping, vaginal bleeding, and fatigue in the first few days after taking methotrexate. °· Blood tests will be taken for several weeks to check the pregnancy hormone levels. The blood tests are performed until there is no more pregnancy hormone detected in the blood. °  °This information is not intended to replace advice given to you by your health care provider. Make sure you discuss any questions you have with your health care provider. °  °Document Released: 01/18/2001 Document Revised: 02/14/2014 Document Reviewed: 11/12/2012 °Elsevier Interactive Patient Education ©2016 [employer] ° °

## 2015-03-03 NOTE — MAU Note (Signed)
Pt presents to MAU for repeat BHCG. Pt reports mild cramping on her left lower side. Denies any vaginal bleeding.

## 2015-03-03 NOTE — MAU Provider Note (Signed)
History   [id number]   No chief complaint on file.   HPI Brittney [name] is a 25 y.o. female [phone] here for day 4 labs s/p MTX for ectopic pregnancy. Upon review of records BHCG on 1/20 was 6126. Ultrasound showed  Left adnexal mass with yolk sac & fetal pole, no cardiac activity. Methotrexate was given on that day. Patient return today, denies vaginal bleeding & reports mild abdominal cramping that she states has improved much since the weekend.  Patient's last menstrual period was 01/10/2015.  OB History  Gravida Para Term Preterm AB SAB TAB Ectopic Multiple Living  # Outcome Date GA Lbr Len/2nd Weight Sex Delivery Anes PTL Lv  3 Current           2 Term 07/12/12 [redacted]w[redacted]d 13:06 / 00:07 6 lb 14.6 oz (3.135 kg) M Vag-Spont None  Y  1 Preterm 10/11/05 [redacted]w[redacted]d  7 lb 8 oz (3.402 kg) M Vag-Spont EPI  Y     Comments: No complications      Past Medical History  Diagnosis Date  . Medical history non-contributory     Family History  Problem Relation Age of Onset  . Diabetes Other   . Hypertension Mother   . Hypertension Father     Social History   Social History  . Marital Status: Single    Spouse Name: N/A  . Number of Children: N/A  . Years of Education: N/A   Social History Main Topics  . Smoking status: Never Smoker   . Smokeless tobacco: Never Used  . Alcohol Use: No  . Drug Use: No  . Sexual Activity:    Partners: Male    Birth Control/ Protection: None   Other Topics Concern  . Not on file   Social History Narrative    No Known Allergies  No current facility-administered medications on file prior to encounter.   Current Outpatient Prescriptions on File Prior to Encounter  Medication Sig Dispense Refill  . metroNIDAZOLE (FLAGYL) 500 MG tablet Take 1 tablet (500 mg total) by mouth 2 (two) times daily. 14 tablet 0  . Prenatal Vit-Fe Fumarate-FA (PRENATAL MULTIVITAMIN) TABS tablet Take 1 tablet by mouth daily at 12 noon.        Physical Exam   Filed Vitals:   03/03/15 1630 03/03/15 1631  BP: 122/69 122/69  Pulse: 74   Temp:  98.5 F (36.9 C)  Resp: 18     Physical Exam  Constitutional: She appears well-developed and well-nourished. No distress.  HENT:  Head: Normocephalic and atraumatic.  Cardiovascular: Normal rate.   Respiratory: Effort normal. No respiratory distress.  GI: Soft. There is no tenderness.  Musculoskeletal: Normal range of motion.  Skin: She is not diaphoretic.  Psychiatric: She has a normal mood and affect. Her behavior is normal. Judgment and thought content normal.    MAU Course  Procedures Component     Latest Ref Rng 02/27/2015 03/03/2015  HCG, Beta Chain, Quant, S     <5 mIU/mL 6126 (H) 8150 (H)   MDM Increase in BHCG from day 1 to day 7. Pt reports improvement in symptoms & vital signs stable. 1308- S/w Dr. Clearance Coots. Ok to discharge home with strict return precautions.   Assessment and Plan  25 y.o. [id number] at [redacted]w[redacted]d wks Pregnancy Follow-up BHCG 1. Ectopic pregnancy, tubal     P: Discharge home Return for day 7 labs Strict return precautions discussed  with patient  Judeth Horn, NP 03/03/2015 6:04 PM

## 2015-03-06 ENCOUNTER — Inpatient Hospital Stay (HOSPITAL_COMMUNITY)
Admission: AD | Admit: 2015-03-06 | Discharge: 2015-03-06 | Disposition: A | Discharge status: Home / Self Care | Payer: Medicaid Other | Source: Ambulatory Visit | Attending: Obstetrics | Admitting: Obstetrics

## 2015-03-06 DIAGNOSIS — Z3A01 Less than 8 weeks gestation of pregnancy: Secondary | ICD-10-CM | POA: Insufficient documentation

## 2015-03-06 DIAGNOSIS — O009 Unspecified ectopic pregnancy without intrauterine pregnancy: Secondary | ICD-10-CM

## 2015-03-06 LAB — HCG, QUANTITATIVE, PREGNANCY: HCG, BETA CHAIN, QUANT, S: 6728 m[IU]/mL — AB (ref <5)

## 2015-03-06 NOTE — MAU Note (Signed)
Day 7 post MTX.  Some mild sharp pains on right side, in hip and side.  Having some vag bleeding, passed some clots- that is when had the sharp pain.

## 2015-03-06 NOTE — MAU Provider Note (Signed)
[name]  is a 25 y.o. [id number]  at [redacted]w[redacted]d who presents to MAU today for follow-up quant hCG. The patient denies vaginal bleeding, N/V or fever. She states that she continues to have mild cramping.   BP 128/71 mmHg  Pulse 77  Temp(Src) 98.3 F (36.8 C) (Oral)  Resp 18  LMP 01/10/2015  GENERAL: Well-developed, well-nourished female in no acute distress.  HEENT: Normocephalic, atraumatic.   LUNGS: Effort normal HEART: Regular rate  SKIN: Warm, dry and without erythema PSYCH: Normal mood and affect  Results for [name], [name] (MRN [id number]) as of 03/06/2015 15:56  Ref. Range 02/27/2015 03:28 02/27/2015 23:12 02/27/2015 23:38 02/27/2015 23:45 02/28/2015 00:00 02/28/2015 01:20 02/28/2015 02:01 02/28/2015 05:50 03/03/2015 16:32 03/06/2015 14:25  HCG, Beta Chain, Quant, S Latest Ref Range: <5 mIU/mL 6126 (H)        8150 (H) 6728 (H)   MDM Discussed patient with Dr. Clearance Coots. He would like patient to continue to be seen by our practice weekly. Advised that this would be done in [facility] and he would no longer be consulted and he agreed.   A: Ectopic pregnancy S/P MTX Appropriate decline for Day #7 after MTX  P: Discharge home Bleeding/ectopic warning signs reviewed Patient will follow-up in 1 week in [facility] for labs only. They will call with an appointment time.  Patient may return to MAU as needed or if her condition were to change or worsen   Marny Lowenstein, PA-C  03/06/2015 3:47 PM

## 2015-03-06 NOTE — Discharge Instructions (Signed)
Methotrexate Treatment for an Ectopic Pregnancy, Care After °Refer to this sheet in the next few weeks. These instructions provide you with information on caring for yourself after your procedure. Your health care provider may also give you more specific instructions. Your treatment has been planned according to current medical practices, but problems sometimes occur. Call your health care provider if you have any problems or questions after your procedure. °WHAT TO EXPECT AFTER THE PROCEDURE °You may have some abdominal cramping, vaginal bleeding, and fatigue in the first few days after taking methotrexate. Some other possible side effects of methotrexate include: °· Nausea. °· Vomiting. °· Diarrhea. °· Mouth sores. °· Swelling or irritation of the lining of your lungs (pneumonitis). °· Liver damage. °· Hair loss. °HOME CARE INSTRUCTIONS  °After you have received the methotrexate medicine, you need to be careful of your activities and watch your condition for several weeks. It may take 1 week before your hormone levels return to normal. °· Keep all follow-up appointments as directed by your health care provider. °· Avoid traveling too far away from your health care provider. °· Do not have sexual intercourse until your health care provider says it is safe to do so. °· You may resume your usual diet. °· Limit strenuous activity. °· Do not take folic acid, prenatal vitamins, or other vitamins that contain folic acid. °· Do not take aspirin, ibuprofen, or naproxen (nonsteroidal anti-inflammatory drugs [NSAIDs]). °· Do not drink alcohol. °SEEK MEDICAL CARE IF:  °· You cannot control your nausea and vomiting. °· You cannot control your diarrhea. °· You have sores in your mouth and want treatment. °· You need pain medicine for your abdominal pain. °· You have a rash. °· You are having a reaction to the medicine. °SEEK IMMEDIATE MEDICAL CARE IF:  °· You have increasing abdominal or pelvic pain. °· You notice increased  bleeding. °· You feel light-headed, or you faint. °· You have shortness of breath. °· Your heart rate increases. °· You have a cough. °· You have chills. °· You have a fever. °  °This information is not intended to replace advice given to you by your health care provider. Make sure you discuss any questions you have with your health care provider. °  °Document Released: 01/13/2011 Document Revised: 01/29/2013 Document Reviewed: 11/12/2012 °Elsevier Interactive Patient Education ©2016 Elsevier Inc. ° °

## 2015-03-09 ENCOUNTER — Telehealth: Payer: Self-pay | Admitting: General Practice

## 2015-03-09 NOTE — Telephone Encounter (Signed)
Patient called stating she needed a return to work letter. Patient states she took herself out of work for a few days given her situation and her job is requesting a letter stating she is able to work. Told patient we could provide a letter that included the dates she was in the hospital and annotate that she does not have any restrictions. Patient verbalized understanding and states she will come by before Friday to get that letter. Patient had no questions

## 2015-03-13 ENCOUNTER — Other Ambulatory Visit: Payer: Medicaid Other

## 2015-03-13 ENCOUNTER — Encounter: Payer: Self-pay | Admitting: Obstetrics and Gynecology

## 2015-03-13 DIAGNOSIS — O3680X0 Pregnancy with inconclusive fetal viability, not applicable or unspecified: Secondary | ICD-10-CM

## 2015-03-13 LAB — HCG, QUANTITATIVE, PREGNANCY: HCG, BETA CHAIN, QUANT, S: 3693.5 m[IU]/mL — AB

## 2015-03-15 ENCOUNTER — Inpatient Hospital Stay (HOSPITAL_COMMUNITY): Payer: Medicaid Other

## 2015-03-15 ENCOUNTER — Encounter (HOSPITAL_COMMUNITY): Payer: Self-pay | Admitting: *Deleted

## 2015-03-15 ENCOUNTER — Inpatient Hospital Stay (HOSPITAL_COMMUNITY)
Admission: AD | Admit: 2015-03-15 | Discharge: 2015-03-15 | Disposition: A | Discharge status: Home / Self Care | Payer: Medicaid Other | Source: Ambulatory Visit | Attending: Obstetrics & Gynecology | Admitting: Obstetrics & Gynecology

## 2015-03-15 DIAGNOSIS — R102 Pelvic and perineal pain: Secondary | ICD-10-CM | POA: Diagnosis present

## 2015-03-15 DIAGNOSIS — O009 Unspecified ectopic pregnancy without intrauterine pregnancy: Secondary | ICD-10-CM | POA: Diagnosis not present

## 2015-03-15 LAB — URINALYSIS, ROUTINE W REFLEX MICROSCOPIC
Bilirubin Urine: NEGATIVE
Glucose, UA: NEGATIVE mg/dL
Ketones, ur: NEGATIVE mg/dL
LEUKOCYTES UA: NEGATIVE
NITRITE: NEGATIVE
PROTEIN: NEGATIVE mg/dL
pH: 6 (ref 5.0–8.0)

## 2015-03-15 LAB — CBC
HEMATOCRIT: 33.6 % — AB (ref 36.0–46.0)
HEMOGLOBIN: 11.5 g/dL — AB (ref 12.0–15.0)
MCH: 30.9 pg (ref 26.0–34.0)
MCHC: 34.2 g/dL (ref 30.0–36.0)
MCV: 90.3 fL (ref 78.0–100.0)
Platelets: 271 10*3/uL (ref 150–400)
RBC: 3.72 MIL/uL — AB (ref 3.87–5.11)
RDW: 12.8 % (ref 11.5–15.5)
WBC: 8.9 10*3/uL (ref 4.0–10.5)

## 2015-03-15 LAB — URINE MICROSCOPIC-ADD ON

## 2015-03-15 LAB — HCG, QUANTITATIVE, PREGNANCY: hCG, Beta Chain, Quant, S: 2566 m[IU]/mL — ABNORMAL HIGH (ref <5)

## 2015-03-15 MED ORDER — IBUPROFEN 800 MG PO TABS: 800.0000 mg | ORAL_TABLET | Freq: Three times a day (TID) | ORAL | Status: DC | PRN

## 2015-03-15 MED ORDER — KETOROLAC TROMETHAMINE 60 MG/2ML IM SOLN
60.0000 mg | Freq: Once | INTRAMUSCULAR | Status: AC
  Administered 2015-03-15: 60 mg via INTRAMUSCULAR
  Filled 2015-03-15: qty 2

## 2015-03-15 NOTE — Discharge Instructions (Signed)

## 2015-03-15 NOTE — MAU Provider Note (Signed)
History     CSN: [id number]  Arrival date and time: 03/15/15 9528   First Provider Initiated Contact with Patient 03/15/15 2020      Chief Complaint  Patient presents with  . Pelvic Pain   Pelvic Pain The patient's primary symptoms include pelvic pain. This is a new problem. The current episode started yesterday. The problem occurs constantly. The problem has been unchanged. The pain is severe. The problem affects both sides. She is not pregnant (recent ectopic pregnancy, had MTX ). Associated symptoms include abdominal pain, constipation, diarrhea and nausea. Pertinent negatives include no chills, dysuria, fever, frequency, urgency or vomiting. Nothing aggravates the symptoms. She has tried nothing for the symptoms.    Past Medical History  Diagnosis Date  . Medical history non-contributory     Past Surgical History  Procedure Laterality Date  . No past surgeries      Family History  Problem Relation Age of Onset  . Diabetes Other   . Hypertension Mother   . Hypertension Father     Social History  Substance Use Topics  . Smoking status: Never Smoker   . Smokeless tobacco: Never Used  . Alcohol Use: No    Allergies: No Known Allergies  Prescriptions prior to admission  Medication Sig Dispense Refill Last Dose  . Multiple Vitamin (MULTIVITAMIN WITH MINERALS) TABS tablet Take 1 tablet by mouth daily.   03/15/2015 at Unknown time  . metroNIDAZOLE (FLAGYL) 500 MG tablet Take 1 tablet (500 mg total) by mouth 2 (two) times daily. 14 tablet 0 new rx    Review of Systems  Constitutional: Negative for fever and chills.  Gastrointestinal: Positive for nausea, abdominal pain, diarrhea and constipation. Negative for vomiting.  Genitourinary: Positive for pelvic pain. Negative for dysuria, urgency and frequency.   Physical Exam   Blood pressure 135/72, pulse 92, temperature 98.6 F (37 C), temperature source Oral, resp. rate 18, last menstrual period 01/10/2015.  Physical  Exam  Nursing note and vitals reviewed. Constitutional: She is oriented to person, place, and time. She appears well-developed and well-nourished. No distress.  HENT:  Head: Normocephalic.  Cardiovascular: Normal rate.   Respiratory: Effort normal.  GI: Soft. There is no tenderness. There is no rebound.  Neurological: She is alert and oriented to person, place, and time.  Skin: Skin is warm and dry.  Psychiatric: She has a normal mood and affect.  Results for Brittney, [name] (MRN [id number]) as of 03/15/2015 21:25  Ref. Range 03/06/2015 14:25 03/13/2015 09:48 03/15/2015 19:55 03/15/2015 20:19  HCG, Beta Chain, Quant, S Latest Ref Range: <5 mIU/mL 6728 (H) 3693.5 (H)  2566 (H)   Results for orders placed or performed during the hospital encounter of 03/15/15 (from the past 24 hour(s))  Urinalysis, Routine w reflex microscopic (not at [facility])     Status: Abnormal   Collection Time: 03/15/15  7:55 PM  Result Value Ref Range   Color, Urine YELLOW YELLOW   APPearance CLEAR CLEAR   Specific Gravity, Urine >1.030 (H) 1.005 - 1.030   pH 6.0 5.0 - 8.0   Glucose, UA NEGATIVE NEGATIVE mg/dL   Hgb urine dipstick LARGE (A) NEGATIVE   Bilirubin Urine NEGATIVE NEGATIVE   Ketones, ur NEGATIVE NEGATIVE mg/dL   Protein, ur NEGATIVE NEGATIVE mg/dL   Nitrite NEGATIVE NEGATIVE   Leukocytes, UA NEGATIVE NEGATIVE  Urine microscopic-add on     Status: Abnormal   Collection Time: 03/15/15  7:55 PM  Result Value Ref Range   Squamous Epithelial /  LPF 0-5 (A) NONE SEEN   WBC, UA 0-5 0 - 5 WBC/hpf   RBC / HPF 6-30 0 - 5 RBC/hpf   Bacteria, UA FEW (A) NONE SEEN  CBC     Status: Abnormal   Collection Time: 03/15/15  8:19 PM  Result Value Ref Range   WBC 8.9 4.0 - 10.5 K/uL   RBC 3.72 (L) 3.87 - 5.11 MIL/uL   Hemoglobin 11.5 (L) 12.0 - 15.0 g/dL   HCT 16.1 (L) 09.6 - 04.5 %   MCV 90.3 78.0 - 100.0 fL   MCH 30.9 26.0 - 34.0 pg   MCHC 34.2 30.0 - 36.0 g/dL   RDW 40.9 81.1 - 91.4 %   Platelets 271 150 - 400  K/uL  hCG, quantitative, pregnancy     Status: Abnormal   Collection Time: 03/15/15  8:19 PM  Result Value Ref Range   hCG, Beta Chain, Quant, S 2566 (H) <5 mIU/mL   US Ob Transvaginal  03/15/2015  CLINICAL DATA:  Status post methotrexate for left ectopic pregnancy. Increasing pain and rectal pressure since 02/01 2017. Beta HCG is pending. EXAM: TRANSVAGINAL OB ULTRASOUND TECHNIQUE: Transvaginal ultrasound was performed for complete evaluation of the gestation as well as the maternal uterus, adnexal regions, and pelvic cul-de-sac. COMPARISON:  02/28/2015 FINDINGS: Intrauterine gestational sac: None Yolk sac:  None Embryo:  None Cardiac Activity: None Subchorionic hemorrhage:  None Maternal uterus/adnexae: Posterior to left ovary, there is a complex solid mass containing probable gestational sac. The mass measures 4.8 x 2.0 x 5.6 cm. No definite fetal pole or cardiac activity identified within the presumed ectopic pregnancy. Previously the mass measured 1.4 x 1.3 x 1.6 cm, inclusive of the presumed ectopic gestational sac. A definite fetal pole is not identified on the current study. A small amount of complex fluid is identified within the cul de sac. IMPRESSION: 1. Increased size of presumed ectopic pregnancy in the left adnexa region. 2. Persistent gestational sac within the left adnexa. No fetal pole identified. 3. Small amount of complex fluid within the cul-de-sac. Critical Value/emergent results were called by telephone at the time of interpretation on 03/15/2015 at 9:12 pm to [facility] , who verbally acknowledged these results. Electronically Signed   By: Norva Pavlov M.D.   On: 03/15/2015 21:12    MAU Course  Procedures  MDM 2129: D/W Dr. Despina Hidden, HCG is falling appropriately, [facility] for routine FU. May have toradol for pain today.   Assessment and Plan   1. Pelvic pain in female   2. Ectopic pregnancy    DC home Comfort measures reviewed  Ectopic precautions RX: ibuprofen  #30   Return to MAU as needed FU with OB as planned  Follow-up Information    Follow up with [facility].   Specialty:  Obstetrics and Gynecology   Why:  Wed 03/18/15 at 1:00    Contact information:   [address] [place] [address] [phone]        Tawnya Crook 03/15/2015, 9:00 PM

## 2015-03-15 NOTE — MAU Note (Signed)
Pt presents complaining of rectal pressure, pain with urination, "blacking out," no appetite. States her intestines feel tender and swollen. Feels irritated and tired. Pain in stomach.

## 2015-03-16 ENCOUNTER — Telehealth: Payer: Self-pay

## 2015-03-16 NOTE — Telephone Encounter (Signed)
Patient needs to return in 1 week for quant. Has appropriate drop in hCG. I have called her and left message to call us back regarding coming in for another quant.

## 2015-03-17 NOTE — Telephone Encounter (Signed)
Called pt on mobile # and left message that I am calling with test results. Please call back and state in her message whether this information can be left on her voice mail.

## 2015-03-18 ENCOUNTER — Ambulatory Visit: Payer: Medicaid Other | Admitting: Obstetrics and Gynecology

## 2015-03-18 ENCOUNTER — Telehealth: Payer: Self-pay | Admitting: General Practice

## 2015-03-18 NOTE — Telephone Encounter (Signed)
Called patient regarding missed office appt. Patient states she rescheduled the appt for the lab only visit for Friday. Patient asked about results from Friday & Sunday. Informed patient. Patient verbalized understanding & had no other questions

## 2015-03-20 ENCOUNTER — Other Ambulatory Visit: Payer: Medicaid Other

## 2015-03-23 ENCOUNTER — Other Ambulatory Visit: Payer: Medicaid Other

## 2015-03-23 NOTE — Telephone Encounter (Signed)
Pt has been notified and due to transportation she will not be able to come in for another lab draw until this Thrusday

## 2015-03-24 ENCOUNTER — Ambulatory Visit (INDEPENDENT_AMBULATORY_CARE_PROVIDER_SITE_OTHER): Payer: Medicaid Other | Admitting: Obstetrics and Gynecology

## 2015-03-24 ENCOUNTER — Encounter: Payer: Self-pay | Admitting: Obstetrics and Gynecology

## 2015-03-24 VITALS — BP 130/63 | HR 94 | Temp 98.5°F | Wt 139.0 lb

## 2015-03-24 DIAGNOSIS — K6289 Other specified diseases of anus and rectum: Secondary | ICD-10-CM

## 2015-03-24 DIAGNOSIS — K5901 Slow transit constipation: Secondary | ICD-10-CM

## 2015-03-24 DIAGNOSIS — Z23 Encounter for immunization: Secondary | ICD-10-CM | POA: Diagnosis not present

## 2015-03-24 MED ORDER — DOCUSATE SODIUM 100 MG PO CAPS: 100.0000 mg | ORAL_CAPSULE | Freq: Two times a day (BID) | ORAL | Status: DC | PRN

## 2015-03-24 MED ORDER — PHENYLEPH-SHARK LIV OIL-MO-PET 0.25-3-14-71.9 % RE OINT: 1.0000 "application " | TOPICAL_OINTMENT | Freq: Two times a day (BID) | RECTAL | Status: DC | PRN

## 2015-03-24 MED ORDER — POLYETHYLENE GLYCOL 3350 17 GM/SCOOP PO POWD: 17.0000 g | Freq: Two times a day (BID) | ORAL | Status: DC | PRN

## 2015-03-24 NOTE — Patient Instructions (Signed)
   Follow-up with OBGYN for B-hcg levels on Thursday  Follow below bowel regimen  No interactions with bowel medications  Can also use your ibuprofen that was prescribed previously for discomfort  For your constipation, we'll need to try several ways to treat this:  Try first:  Drink plenty of fluid, preferably water, throughout the day  Eat foods high in fiber such as fruits, vegetables, and grains  Exercise, such as walking, is a good way to keep your bowels regular  Drink warm fluids, especially warm prune juice, or decaf coffee  Eat a 1/2 cup of real oatmeal (not instant), 1/2 cup applesauce, and 1/2-1 cup warm prune juice every day  Try some probiotics to get bowel flora back to normal  If not helping then try:  If needed, you may take Colace (docusate sodium) stool softener once or twice a day to help keep the stool soft.   If you still are having problems with constipation, you may take Miralax once daily as needed to help keep your bowels regular.  Stop taking it if you have diarrhea.  You can increase this to 3 times a day to help you go as well.  If you still haven't gone to the restroom after three days, it's time to try either a suppository or an enema.  This should make you go.  Continue to take the stool softener and Miralax, even if you use the suppository.  Your goal is to have a soft bowel movement once every other day at the least. Once you start going to the bathroom, cut back until you achieve that goal.    Constipation, Adult Constipation is when a person:  Poops (has a bowel movement) less than 3 times a week.  Has a hard time pooping.  Has poop that is dry, hard, or bigger than normal. HOME CARE   Eat foods with a lot of fiber in them. This includes fruits, vegetables, beans, and whole grains such as brown rice.  Avoid fatty foods and foods with a lot of sugar. This includes french fries, hamburgers, cookies, candy, and soda.  If you are not  getting enough fiber from food, take products with added fiber in them (supplements).  Drink enough fluid to keep your pee (urine) clear or pale yellow.  Exercise on a regular basis, or as told by your doctor.  Go to the restroom when you feel like you need to poop. Do not hold it.  Only take medicine as told by your doctor. Do not take medicines that help you poop (laxatives) without talking to your doctor first. GET HELP RIGHT AWAY IF:   You have bright red blood in your poop (stool).  Your constipation lasts more than 4 days or gets worse.  You have belly (abdominal) or butt (rectal) pain.  You have thin poop (as thin as a pencil).  You lose weight, and it cannot be explained. MAKE SURE YOU:   Understand these instructions.  Will watch your condition.  Will get help right away if you are not doing well or get worse.   This information is not intended to replace advice given to you by your health care provider. Make sure you discuss any questions you have with your health care provider.   Document Released: 07/13/2007 Document Revised: 02/14/2014 Document Reviewed: 11/05/2012 Elsevier Interactive Patient Education [employer]

## 2015-03-24 NOTE — Progress Notes (Signed)
   Subjective:   Patient ID: Brittney [name], female    DOB: 09-05-1990, 25 y.o.   MRN: [id number]  Patient presents for Same Day Appointment  Chief Complaint  Patient presents with  . Rectal Pain    last BM x 1-2 weeks ago    HPI: #Rectal Pain: S/p methotrexate shot for ectopic pregnancy (02/28/15) experienced a lot of side effects from mediation States she now has indigestion  Further elicit information patient stating that she has not had good bowel movements and been constipated Unable to work  Body not breaking down anything Last time had a BM was this morning after a week of not having one-dark stool, balls with blood Used glycernin suppository which gave her some relief Bowels are not working Pressure on rectum bleeding from hemrrhoids - states they are bad and sore Scared of interaction with medicine Uncomfortable everyday Havent tried anything for bowels because she is scared things may interact with methotrexate  Decreased appetite abdominal pain 8  Review of Systems   See HPI for ROS.   Smoking status - never smoker  Past medical history, surgical, family, and social history reviewed and updated in the EMR as appropriate.  Objective:  BP 130/63 mmHg  Pulse 94  Temp(Src) 98.5 F (36.9 C) (Oral)  Wt 139 lb (63.05 kg)  LMP 01/10/2015 Vitals and nursing note reviewed  Physical Exam  Constitutional: She is well-developed, well-nourished, and in no distress.  Cardiovascular: Normal rate.   Pulmonary/Chest: Effort normal.  Abdominal: Soft. Bowel sounds are normal. She exhibits no distension. There is no tenderness. There is no guarding.  Skin: Skin is warm and dry.    Assessment & Plan:  1. Slow transit constipation Symptoms consistent with constipation. Patient endorses straining and rectal bleeding after bowel movement. Hemorrhoids are inflamed and irritated. Will start patient on a bowel regimen. Discussed with her that Colace and Miralax will not  interfere with methotrexate that she received last month. Handout given with exact regimen. Discussed with the patient if she does not have a bowel movement with this regimen in the next 3 days to return to clinic. Also prescribed patient ointment for hemorrhoids.  Meds ordered this encounter  Medications  . docusate sodium (COLACE) 100 MG capsule    Sig: Take 1 capsule (100 mg total) by mouth 2 (two) times daily as needed for mild constipation or moderate constipation.    Dispense:  30 capsule    Refill:  1  . polyethylene glycol powder (GLYCOLAX/MIRALAX) powder    Sig: Take 17 g by mouth 2 (two) times daily as needed.    Dispense:  3350 g    Refill:  1  . phenylephrine-shark liver oil-mineral oil-petrolatum (PREPARATION H) 0.25-3-14-71.9 % rectal ointment    Sig: Place 1 application rectally 2 (two) times daily as needed for hemorrhoids.    Dispense:  30 g    Refill:  0    Caryl Ada, DO 03/24/2015, 4:01 PM PGY-2, [facility]

## 2015-03-26 ENCOUNTER — Other Ambulatory Visit: Payer: Medicaid Other

## 2015-03-26 DIAGNOSIS — O039 Complete or unspecified spontaneous abortion without complication: Secondary | ICD-10-CM

## 2015-03-27 ENCOUNTER — Telehealth: Payer: Self-pay | Admitting: Family Medicine

## 2015-03-27 LAB — HCG, QUANTITATIVE, PREGNANCY: HCG, BETA CHAIN, QUANT, S: 842.4 m[IU]/mL — AB

## 2015-03-27 NOTE — Telephone Encounter (Signed)
Pt called and would like a letter written stating what her condition was on her visit last week and that she was prescribed medication. She is doing a lot better but she needs a letter stating that she can return to work on March 3rd. Please call patient when ready to pick up. jw

## 2015-03-27 NOTE — Telephone Encounter (Signed)
Spoke with patient and she voiced understanding.  She will come pick up letter on Monday. Prisca Gearing,CMA

## 2015-03-27 NOTE — Telephone Encounter (Signed)
Will forward to Dr. Burnard Hawthorne who saw patient for this visit. Mackie Holness,CMA

## 2015-03-27 NOTE — Telephone Encounter (Signed)
Yes I did see patient on 2/14. I am unable to write her a letter to be out of work until March 3rd! Her symptoms and medical complaint did not warrant that and it would be unethical. I have written a letter that she was seen in my office on 2/14 and may return to work tomorrow. I will not disclose in the letter her medical complaint as this violates HIPPA. If she wants to disclose to her employer she can or we need a written statement to release her medical records to employer.

## 2015-03-30 ENCOUNTER — Telehealth: Payer: Self-pay

## 2015-03-30 NOTE — Telephone Encounter (Signed)
Pt has been informed of decrease quant. She is scheduled to come back in this Wednesday for a repeat.

## 2015-04-01 ENCOUNTER — Other Ambulatory Visit: Payer: Medicaid Other

## 2015-04-01 ENCOUNTER — Encounter: Payer: Medicaid Other | Admitting: Obstetrics

## 2015-04-06 ENCOUNTER — Other Ambulatory Visit: Payer: Medicaid Other

## 2015-04-07 ENCOUNTER — Other Ambulatory Visit: Payer: Medicaid Other

## 2015-04-09 ENCOUNTER — Other Ambulatory Visit: Payer: Medicaid Other

## 2015-04-09 DIAGNOSIS — O039 Complete or unspecified spontaneous abortion without complication: Secondary | ICD-10-CM

## 2015-04-14 ENCOUNTER — Telehealth: Payer: Self-pay | Admitting: General Practice

## 2015-04-14 ENCOUNTER — Encounter: Payer: Self-pay | Admitting: Family Medicine

## 2015-04-14 ENCOUNTER — Ambulatory Visit (INDEPENDENT_AMBULATORY_CARE_PROVIDER_SITE_OTHER): Payer: Medicaid Other | Admitting: Family Medicine

## 2015-04-14 VITALS — BP 118/60 | HR 98 | Temp 98.4°F | Wt 140.0 lb

## 2015-04-14 DIAGNOSIS — R1909 Other intra-abdominal and pelvic swelling, mass and lump: Secondary | ICD-10-CM

## 2015-04-14 DIAGNOSIS — R1900 Intra-abdominal and pelvic swelling, mass and lump, unspecified site: Secondary | ICD-10-CM | POA: Diagnosis not present

## 2015-04-14 DIAGNOSIS — Z23 Encounter for immunization: Secondary | ICD-10-CM | POA: Diagnosis not present

## 2015-04-14 LAB — BETA HCG QUANT (REF LAB): Beta hCG, Tumor Marker: 288.9 m[IU]/mL — ABNORMAL HIGH (ref <5.0)

## 2015-04-14 NOTE — Assessment & Plan Note (Signed)
Etiology unclear. Differentials include inguinal lymphadenitis vs DVT. Hernia unlikely due to the firmness of the swelling. Soft tissue U/S ordered. Dopplex U/S also ordered to r/o venous thrombosis. Patient to use Tylenol as needed for pain. If swelling or pain worsen prior to U/S report, I will start her on Antibiotic. Return precaution discussed.

## 2015-04-14 NOTE — Telephone Encounter (Signed)
Patient called and left message requesting lab results.

## 2015-04-14 NOTE — Progress Notes (Signed)
Subjective:     Patient ID: Brittney [name], female   DOB: 10/19/1990, 25 y.o.   MRN: [id number]  Groin Pain The patient's pertinent negatives include no genital itching, genital lesions, genital odor or vaginal discharge. Primary symptoms comment: right groin are swollen and painful when she touches it. This is a new problem. The current episode started yesterday. The problem occurs intermittently. The problem has been unchanged. The problem affects the right side. Pregnant now: LMP 02/24/15 till March 1st. S/P Ectopic pregnancy. She had Methotrexate then. Associated symptoms include constipation. Pertinent negatives include no abdominal pain, discolored urine, hematuria, nausea or vomiting. Associated symptoms comments: At times when her bladder fills up she will have some cramping pain. She had constipation from the methotrexate so she took laxative which helped with her symptoms.. Nothing aggravates the symptoms. She has tried nothing for the symptoms. She is sexually active (last sexual activity was Dec 2016). No, her partner does not have an STD. Her menstrual history has been regular. Her past medical history is significant for an ectopic pregnancy, an STD and vaginosis.  She recently completed treatment for ectopic pregnancy.  Current Outpatient Prescriptions on File Prior to Visit  Medication Sig Dispense Refill  . docusate sodium (COLACE) 100 MG capsule Take 1 capsule (100 mg total) by mouth 2 (two) times daily as needed for mild constipation or moderate constipation. (Patient not taking: Reported on 04/14/2015) 30 capsule 1  . ibuprofen (ADVIL,MOTRIN) 800 MG tablet Take 1 tablet (800 mg total) by mouth every 8 (eight) hours as needed. (Patient not taking: Reported on 04/14/2015) 30 tablet 0  . metroNIDAZOLE (FLAGYL) 500 MG tablet Take 1 tablet (500 mg total) by mouth 2 (two) times daily. (Patient not taking: Reported on 04/14/2015) 14 tablet 0  . Multiple Vitamin (MULTIVITAMIN WITH MINERALS) TABS  tablet Take 1 tablet by mouth daily. Reported on 04/14/2015    . phenylephrine-shark liver oil-mineral oil-petrolatum (PREPARATION H) 0.25-3-14-71.9 % rectal ointment Place 1 application rectally 2 (two) times daily as needed for hemorrhoids. (Patient not taking: Reported on 04/14/2015) 30 g 0  . polyethylene glycol powder (GLYCOLAX/MIRALAX) powder Take 17 g by mouth 2 (two) times daily as needed. (Patient not taking: Reported on 04/14/2015) 3350 g 1   No current facility-administered medications on file prior to visit.   Past Medical History  Diagnosis Date  . Medical history non-contributory      Review of Systems  Respiratory: Negative.   Cardiovascular: Negative.   Gastrointestinal: Positive for constipation. Negative for nausea, vomiting and abdominal pain.  Genitourinary: Negative for hematuria and vaginal discharge.       Groin pain and swelling  All other systems reviewed and are negative.  Filed Vitals:   04/14/15 1111  BP: 118/60  Pulse: 98  Temp: 98.4 F (36.9 C)  TempSrc: Oral  Weight: 140 lb (63.504 kg)  SpO2: 100%       Objective:   Physical Exam  Constitutional: She appears well-developed. No distress.  Cardiovascular: Normal rate, regular rhythm, normal heart sounds and intact distal pulses.   No murmur heard. Pulmonary/Chest: Effort normal and breath sounds normal. No respiratory distress. She has no wheezes.  Abdominal: Bowel sounds are normal. She exhibits no distension. There is no splenomegaly or hepatomegaly. There is no tenderness. There is no guarding. No hernia.    Nursing note and vitals reviewed.      Assessment:     Right groin swelling with tenderness     Plan:  Check problem list.

## 2015-04-14 NOTE — Patient Instructions (Signed)
It was nice seeing you today. I was able to palpate the lump on your right groin.It does not feel like a hernia. It could be infected lymph node. We will also check to make sure no clot in there. I have ordered and U./S to assess. I will call you as soon as I have the report.

## 2015-04-14 NOTE — Telephone Encounter (Signed)
Patient calling to get test results.

## 2015-04-15 ENCOUNTER — Encounter (HOSPITAL_COMMUNITY): Payer: Self-pay

## 2015-04-15 NOTE — Telephone Encounter (Signed)
Patient calling to state she has not received a call about her results, and wants an RN to call her ASAP.

## 2015-04-17 ENCOUNTER — Ambulatory Visit (HOSPITAL_BASED_OUTPATIENT_CLINIC_OR_DEPARTMENT_OTHER)
Admission: RE | Admit: 2015-04-17 | Discharge: 2015-04-17 | Disposition: A | Discharge status: Home / Self Care | Payer: Medicaid Other | Source: Ambulatory Visit | Attending: Family Medicine | Admitting: Family Medicine

## 2015-04-17 ENCOUNTER — Ambulatory Visit (HOSPITAL_COMMUNITY)
Admission: RE | Admit: 2015-04-17 | Discharge: 2015-04-17 | Disposition: A | Discharge status: Home / Self Care | Payer: Medicaid Other | Source: Ambulatory Visit | Attending: Family Medicine | Admitting: Family Medicine

## 2015-04-17 ENCOUNTER — Telehealth: Payer: Self-pay | Admitting: Family Medicine

## 2015-04-17 DIAGNOSIS — R1909 Other intra-abdominal and pelvic swelling, mass and lump: Secondary | ICD-10-CM | POA: Diagnosis not present

## 2015-04-17 NOTE — Progress Notes (Addendum)
VASCULAR LAB PRELIMINARY  PRELIMINARY  PRELIMINARY  PRELIMINARY  Right lower extremity venous duplex completed.     Preliminary repost - Right:  No evidence of DVT, superficial thrombosis, or Baker's cyst. Incidental finding - There is a nodule in the right groin area etiology unknown  Alysah Carton, RVS 04/17/2015, 3:06 PM

## 2015-04-17 NOTE — Telephone Encounter (Signed)
U/S report discussed with patient. Dopplex U/S still pending. Patient stated she is feeling better with her swelling with no more pain. As discussed with her I spoke with the radiologist who read her report and he recommended CT scan or MRI to further delineate the type of nodule seen, however since she is better and swelling is resolving we will watch for now. She is advised to return soon if worsening for further imaging. I will call her with duplex U/S report. She agreed with plan.

## 2015-04-20 ENCOUNTER — Other Ambulatory Visit: Payer: Medicaid Other

## 2015-04-20 ENCOUNTER — Telehealth: Payer: Self-pay | Admitting: Family Medicine

## 2015-04-20 NOTE — Telephone Encounter (Signed)
Called Brittney [name] and informed her that her recent pregnancy hormone level from last week was lower however still needs to be checked weekly. Brittney [name] stated that she has appt for tomorrow @ 1530. She had no questions.

## 2015-04-20 NOTE — Telephone Encounter (Signed)
Message left to call back for test result.     NB: I contacted her last Friday about her groin U/S report which was positive for nodule. Her duplex U/S report is now available. The last time I spoke with her she stated her groin pain and swelling is improving. I will still recommend follow up to ensure the nodule is benign. She will benefit from PCP f/u to consider CT/MRI of the groin to further assess this nodule. I will forward report to her PCP for follow-up.  Result: Summary:  - No evidence of deep vein or superficial thrombosis involving the right lower extremity and left common femoral vein. - No evidence of Baker&'s cyst on the right. - Incidental finding - Nodule in the right groin etiology unknown.  [place] Misc Soft Tissue  04/17/2015  CLINICAL DATA:  Three day history of right groin swelling. EXAM: ULTRASOUND right LOWER EXTREMITY LIMITED TECHNIQUE: Ultrasound examination of the lower extremity soft tissues was performed in the area of clinical concern. COMPARISON:  No comparison studies available. FINDINGS: Focused ultrasound exam was performed in the right groin area, as identified by the patient. 1.2 x 0.8 x 1.1 cm hypoechoic nodule is identified in the region of the right groin. This shows no evidence for internal flow on color Doppler evaluation, but also has no substantial enhanced through transmission to suggest cyst. Imaging features are nonspecific. Small lymph nodes are seen in the same region. IMPRESSION: 1.2 cm hypoechoic nodule in the area of patient concern, nonspecific. Electronically Signed   By: Kennith CenterEric  Mansell M.D.   On: 04/17/2015 15:45

## 2015-04-20 NOTE — Telephone Encounter (Signed)
Patient called back. Result discussed with her. She is encouraged to follow up with her PCP soon for reassessment and further imaging if necessary. She verbalized understanding.

## 2015-04-21 ENCOUNTER — Other Ambulatory Visit: Payer: Medicaid Other

## 2015-04-21 DIAGNOSIS — O3680X0 Pregnancy with inconclusive fetal viability, not applicable or unspecified: Secondary | ICD-10-CM

## 2015-04-22 LAB — HCG, QUANTITATIVE, PREGNANCY: hCG, Beta Chain, Quant, S: 111.7 m[IU]/mL — ABNORMAL HIGH

## 2015-05-04 ENCOUNTER — Ambulatory Visit: Payer: Medicaid Other | Admitting: Family Medicine

## 2015-05-29 ENCOUNTER — Ambulatory Visit: Payer: Medicaid Other | Admitting: Family Medicine

## 2015-08-05 ENCOUNTER — Encounter (HOSPITAL_COMMUNITY): Payer: Self-pay

## 2015-08-05 ENCOUNTER — Emergency Department (HOSPITAL_COMMUNITY)
Admission: EM | Admit: 2015-08-05 | Discharge: 2015-08-05 | Disposition: A | Discharge status: Home / Self Care | Payer: Medicaid Other | Attending: Emergency Medicine | Admitting: Emergency Medicine

## 2015-08-05 DIAGNOSIS — R21 Rash and other nonspecific skin eruption: Secondary | ICD-10-CM | POA: Insufficient documentation

## 2015-08-05 DIAGNOSIS — T7840XA Allergy, unspecified, initial encounter: Secondary | ICD-10-CM | POA: Diagnosis present

## 2015-08-05 MED ORDER — DIPHENHYDRAMINE HCL 25 MG PO TABS: 25.0000 mg | ORAL_TABLET | Freq: Four times a day (QID) | ORAL | Status: DC | PRN

## 2015-08-05 MED ORDER — DIPHENHYDRAMINE HCL 25 MG PO CAPS
50.0000 mg | ORAL_CAPSULE | Freq: Once | ORAL | Status: AC
  Administered 2015-08-05: 50 mg via ORAL
  Filled 2015-08-05: qty 2

## 2015-08-05 MED ORDER — PREDNISONE 10 MG (21) PO TBPK: 10.0000 mg | ORAL_TABLET | Freq: Every day | ORAL | Status: DC

## 2015-08-05 MED ORDER — PREDNISONE 20 MG PO TABS
60.0000 mg | ORAL_TABLET | Freq: Once | ORAL | Status: AC
  Administered 2015-08-05: 60 mg via ORAL
  Filled 2015-08-05: qty 3

## 2015-08-05 NOTE — ED Notes (Signed)
Pt also stated "I took benadryl 25 mg twice yesterday"

## 2015-08-05 NOTE — Discharge Instructions (Signed)
Rash A rash is a change in the color or texture of the skin. There are many different types of rashes. You may have other problems that accompany your rash. CAUSES   Infections.  Allergic reactions. This can include allergies to pets or foods.  Certain medicines.  Exposure to certain chemicals, soaps, or cosmetics.  Heat.  Exposure to poisonous plants.  Tumors, both cancerous and noncancerous. SYMPTOMS   Redness.  Scaly skin.  Itchy skin.  Dry or cracked skin.  Bumps.  Blisters.  Pain. DIAGNOSIS  Your caregiver may do a physical exam to determine what type of rash you have. A skin sample (biopsy) may be taken and examined under a microscope. TREATMENT  Treatment depends on the type of rash you have. Your caregiver may prescribe certain medicines. For serious conditions, you may need to see a skin doctor (dermatologist). HOME CARE INSTRUCTIONS   Avoid the substance that caused your rash.  Do not scratch your rash. This can cause infection.  You may take cool baths to help stop itching.  Only take over-the-counter or prescription medicines as directed by your caregiver.  Keep all follow-up appointments as directed by your caregiver. SEEK IMMEDIATE MEDICAL CARE IF:  You have increasing pain, swelling, or redness.  You have a fever.  You have new or severe symptoms.  You have body aches, diarrhea, or vomiting.  Your rash is not better after 3 days. MAKE SURE YOU:  Understand these instructions.  Will watch your condition.  Will get help right away if you are not doing well or get worse.   This information is not intended to replace advice given to you by your health care provider. Make sure you discuss any questions you have with your health care provider.   Document Released: 01/14/2002 Document Revised: 02/14/2014 Document Reviewed: 06/11/2014 Elsevier Interactive Patient Education 2016 [employer]   Possible Bedbugs Bedbugs are tiny bugs that  live in and around beds. They stay hidden during the day, and they come out at night and bite. Bedbugs need blood to live and grow. WHERE ARE BEDBUGS FOUND? Bedbugs can be found anywhere, whether a place is clean or dirty. They are most often found in places where many people come and go, such as hotels, shelters, dorms, and health care settings. It is also common for them to be found in homes where there are many birds or bats nearby. WHAT ARE BEDBUG BITES LIKE? A bedbug bite leaves a small red bump with a darker red dot in the middle. The bump may appear soon after a person is bitten or a day or more later. Bedbug bites usually do not hurt, but they may itch. Most people do not need treatment for bedbug bites. The bumps usually go away on their own in a few days. HOW DO I CHECK FOR BEDBUGS? Bedbugs are reddish-brown, oval, and flat. They range in size from 1 mm to 7 mm and they cannot fly. Look for bedbugs in these places:  On mattresses, bed frames, headboards, and box springs.  On drapes and curtains in bedrooms.  Under carpeting in bedrooms.  Behind electrical outlets.  Behind any wallpaper that is peeling.  Inside luggage. Also look for black or red spots or stains on or near the bed. Stains can come from bedbugs that have been crushed or from bedbug waste. WHAT SHOULD I DO IF I FIND BEDBUGS? When Traveling If you find bedbugs while traveling, check all of your possessions carefully before you bring  them into your home. Consider throwing away anything that has bedbugs on it. At Home If you find bedbugs at home, your bedroom may need to be treated by a pest control expert. You may also need to throw away mattresses or luggage. To help keep bedbugs from coming back, consider taking these actions:  Put a plastic cover over your mattress.  Wash your clothes and bedding in water that is hotter than 120F (48.9C) and dry them on a hot setting. Bedbugs are killed by high  temperatures.  Vacuum often around the bed and in all of the cracks and crevices where the bugs might hide.  Carefully check all used furniture, bedding, or clothes that you bring into your home.  Eliminate bird nests and bat roosts that are near your home. In Your Bed If you find bedbugs in your bed, consider wearing pajamas that have long sleeves and pant legs. Bedbugs usually bite areas of the skin that are not covered.   This information is not intended to replace advice given to you by your health care provider. Make sure you discuss any questions you have with your health care provider.   Document Released: 02/26/2010 Document Revised: 06/10/2014 Document Reviewed: 01/20/2014 Elsevier Interactive Patient Education [employer]

## 2015-08-05 NOTE — ED Provider Notes (Signed)
By signing my name below, I, Vista Minkobert Ross, attest that this documentation has been prepared under the direction and in the presence of Osric Klopf N Celestine Bougie, DO. Electronically signed, Vista Minkobert Ross, ED Scribe. 08/05/2015. 1:46 AM.  TIME SEEN: 1:28  CHIEF COMPLAINT: Rash  HPI: HPI Comments: Brittney [name] is a 25 y.o. female with no pertinent PMHx, who presents to the Emergency Department complaining of multiple pruritic, erythematic areas that she noticed upon waking yesterday morning. Pt states she believes her symptoms could be due to accidentally taking one of her boyfriends 500mg  amoxicillin tablets seven days ago. Pt states she has taken Benadryl with no relief; last dose seven hours ago. Pt denies anyone with similar symptoms at home. Pt denies any fever, known tick bites, new mattress. Pt further denies staying in a hotel recently.  Denies any new soaps, lotions, detergents, medications.  Does not have any pets.  BF and children at home do not have similar symptoms.   ROS: See HPI Constitutional: no fever  Eyes: no drainage  ENT: no runny nose   Cardiovascular:  no chest pain  Resp: no SOB  GI: no vomiting GU: no dysuria Integumentary: rash  Allergy: no hives  Musculoskeletal: no leg swelling  Neurological: no slurred speech ROS otherwise negative  PAST MEDICAL HISTORY/PAST SURGICAL HISTORY:  Past Medical History  Diagnosis Date  . Medical history non-contributory     MEDICATIONS:  Prior to Admission medications   Medication Sig Start Date End Date Taking? Authorizing Provider  docusate sodium (COLACE) 100 MG capsule Take 1 capsule (100 mg total) by mouth 2 (two) times daily as needed for mild constipation or moderate constipation. Patient not taking: Reported on 04/14/2015 03/24/15   Pincus LargeJazma Y Phelps, DO  ibuprofen (ADVIL,MOTRIN) 800 MG tablet Take 1 tablet (800 mg total) by mouth every 8 (eight) hours as needed. Patient not taking: Reported on 04/14/2015 03/15/15   Armando ReichertHeather D Hogan,  CNM  metroNIDAZOLE (FLAGYL) 500 MG tablet Take 1 tablet (500 mg total) by mouth 2 (two) times daily. Patient not taking: Reported on 04/14/2015 02/28/15   Duane LopeJennifer I Rasch, NP  Multiple Vitamin (MULTIVITAMIN WITH MINERALS) TABS tablet Take 1 tablet by mouth daily. Reported on 04/14/2015    Historical Provider, MD  phenylephrine-shark liver oil-mineral oil-petrolatum (PREPARATION H) 0.25-3-14-71.9 % rectal ointment Place 1 application rectally 2 (two) times daily as needed for hemorrhoids. Patient not taking: Reported on 04/14/2015 03/24/15   Pincus LargeJazma Y Phelps, DO  polyethylene glycol powder (GLYCOLAX/MIRALAX) powder Take 17 g by mouth 2 (two) times daily as needed. Patient not taking: Reported on 04/14/2015 03/24/15   Pincus LargeJazma Y Phelps, DO   ALLERGIES:  No Known Allergies  SOCIAL HISTORY:  Social History  Substance Use Topics  . Smoking status: Never Smoker   . Smokeless tobacco: Never Used  . Alcohol Use: No    FAMILY HISTORY: Family History  Problem Relation Age of Onset  . Diabetes Other   . Hypertension Mother   . Hypertension Father     EXAM: BP 116/59 mmHg  Pulse 79  Temp(Src) 98.2 F (36.8 C) (Oral)  Resp 20  SpO2 97%  LMP 07/10/2015  Breastfeeding? Unknown CONSTITUTIONAL: Alert and oriented and responds appropriately to questions. Well-appearing; well-nourished HEAD: Normocephalic EYES: Conjunctivae clear, PERRL ENT: normal nose; no rhinorrhea; moist mucous membranes, no angioedema; no lesions in her mouth, No pharyngeal erythema or petechiae, no tonsillar hypertrophy or exudate, no uvular deviation, no trismus or drooling, normal phonation, no stridor, no dental caries  or abscess noted, no Ludwig's angina, tongue sits flat in the bottom of the mouth NECK: Supple, no meningismus, no LAD  CARD: RRR; S1 and S2 appreciated; no murmurs, no clicks, no rubs, no gallops RESP: Normal chest excursion without splinting or tachypnea; breath sounds clear and equal bilaterally; no wheezes, no  rhonchi, no rales, no hypoxia or respiratory distress, speaking full sentences ABD/GI: Normal bowel sounds; non-distended; soft, non-tender, no rebound, no guarding, no peritoneal signs BACK:  The back appears normal and is non-tender to palpation, there is no CVA tenderness EXT: Normal ROM in all joints; non-tender to palpation; no edema; normal capillary refill; no cyanosis, no calf tenderness or swelling    SKIN: Normal color for age and race; warm; diffuse scattered maculopapular erythematous lesions to her torso and extremities but nothing involving her palm, soles or mucous membranes. No petechiae or purpura. No blisters or desquamation. No bullseye rash and no rash in between her digits and no burrowing.  No hives. NEURO: Moves all extremities equally, sensation to light touch intact diffusely, cranial nerves II through XII intact PSYCH: The patient's mood and manner are appropriate. Grooming and personal hygiene are appropriate.  MEDICAL DECISION MAKING: Pt here with rash.  No signs of any life threatening rash such as RMSF, TEN/SJS, meningitis, anaphylaxis.  Rash does not appear to be infectious. Rash looks like insect bites of some sort. Discussed with her that this could be bedbugs. Have recommended she continue Benadryl for itching and will discharge on a steroid taper. Have advised her to contact an exterminator and wash all of her clothing, sheets, towels with hot water and dry and high heat and cover her mattress. Discussed return precautions. She does have a PCP for follow-up. She verbalized understanding and is comfortable with this plan.   At this time, I do not feel there is any life-threatening condition present. I have reviewed and discussed all results (EKG, imaging, lab, urine as appropriate), exam findings with patient. I have reviewed nursing notes and appropriate previous records.  I feel the patient is safe to be discharged home without further emergent workup. Discussed usual  and customary return precautions. Patient and family (if present) verbalize understanding and are comfortable with this plan.  Patient will follow-up with their primary care provider. If they do not have a primary care provider, information for follow-up has been provided to them. All questions have been answered.    This chart was scribed in my presence and reviewed by me personally.   Layla MawKristen N Meghen Akopyan, DO 08/05/15 [phone]

## 2015-08-05 NOTE — ED Notes (Signed)
Pt took one of her boyfriends amoxicillan tablets and she got the hives, she's never taken it before, she took a benedryl for her reaction

## 2015-10-15 ENCOUNTER — Encounter: Payer: Self-pay | Admitting: Obstetrics

## 2015-10-15 ENCOUNTER — Encounter: Payer: Self-pay | Admitting: *Deleted

## 2015-10-15 ENCOUNTER — Ambulatory Visit (INDEPENDENT_AMBULATORY_CARE_PROVIDER_SITE_OTHER): Payer: Medicaid Other | Admitting: Obstetrics

## 2015-10-15 VITALS — BP 115/76 | HR 78 | Temp 97.1°F | Ht 64.0 in | Wt 141.0 lb

## 2015-10-15 DIAGNOSIS — Z3202 Encounter for pregnancy test, result negative: Secondary | ICD-10-CM

## 2015-10-15 DIAGNOSIS — Z0000 Encounter for general adult medical examination without abnormal findings: Secondary | ICD-10-CM

## 2015-10-15 DIAGNOSIS — R140 Abdominal distension (gaseous): Secondary | ICD-10-CM

## 2015-10-15 DIAGNOSIS — Z01419 Encounter for gynecological examination (general) (routine) without abnormal findings: Secondary | ICD-10-CM | POA: Diagnosis not present

## 2015-10-15 DIAGNOSIS — Z3009 Encounter for other general counseling and advice on contraception: Secondary | ICD-10-CM

## 2015-10-15 DIAGNOSIS — Z124 Encounter for screening for malignant neoplasm of cervix: Secondary | ICD-10-CM

## 2015-10-15 LAB — POCT URINE PREGNANCY: Preg Test, Ur: NEGATIVE

## 2015-10-15 MED ORDER — VITAFOL-NANO 18-0.6-0.4 MG PO TABS: 1.0000 | ORAL_TABLET | Freq: Every day | ORAL | 11 refills | Status: DC

## 2015-10-15 NOTE — Addendum Note (Signed)
Addended by: Marya LandryFOSTER, Khori Rosevear D on: 10/15/2015 01:56 PM   Modules accepted: Orders

## 2015-10-15 NOTE — Progress Notes (Signed)
Patient ID: Brittney Najjar, female   DOB: 1990/05/18, 25 y.o.   MRN: [id number]   Subjective:        Brittney [name] is a 25 y.o. female here for a routine exam.  Current complaints: None.    Personal health questionnaire:  Is patient Brittney [name], have a family history of breast and/or ovarian cancer: no Is there a family history of uterine cancer diagnosed at age < 41, gastrointestinal cancer, urinary tract cancer, family member who is a Personnel officer syndrome-associated carrier: no Is the patient overweight and hypertensive, family history of diabetes, personal history of gestational diabetes, preeclampsia or PCOS: no Is patient over 31, have PCOS,  family history of premature CHD under age 28, diabetes, smoke, have hypertension or peripheral artery disease:  no At any time, has a partner hit, kicked or otherwise hurt or frightened you?: no Over the past 2 weeks, have you felt down, depressed or hopeless?: no Over the past 2 weeks, have you felt little interest or pleasure in doing things?:no   Gynecologic History Patient's last menstrual period was 09/28/2015 (exact date). Contraception: none Last Pap: 2015. Results were: normal Last mammogram: n/a. Results were: n/a  Obstetric History OB History  Gravida Para Term Preterm AB Living  3 2 1 1 1 2   SAB TAB Ectopic Multiple Live Births      1 0 2    # Outcome Date GA Lbr Len/2nd Weight Sex Delivery Anes PTL Lv  3 Ectopic 02/19/15 [redacted]w[redacted]d         2 Term 07/12/12 [redacted]w[redacted]d 13:06 / 00:07 6 lb 14.6 oz (3.135 kg) M Vag-Spont None  LIV  1 Preterm 10/11/05 [redacted]w[redacted]d  7 lb 8 oz (3.402 kg) M Vag-Spont EPI  LIV     Birth Comments: No complications      Past Medical History:  Diagnosis Date  . Medical history non-contributory     Past Surgical History:  Procedure Laterality Date  . NO PAST SURGERIES       Current Outpatient Prescriptions:  Marland Kitchen  Multiple Vitamin (MULTIVITAMIN WITH MINERALS) TABS tablet, Take 1 tablet by mouth daily.  Reported on 04/14/2015, Disp: , Rfl:  No Known Allergies  Social History  Substance Use Topics  . Smoking status: Never Smoker  . Smokeless tobacco: Never Used  . Alcohol use No    Family History  Problem Relation Age of Onset  . Diabetes Other   . Hypertension Mother   . Hypertension Father   . Epilepsy Sister   . Epilepsy Brother       Review of Systems  Constitutional: negative for fatigue and weight loss Respiratory: negative for cough and wheezing Cardiovascular: negative for chest pain, fatigue and palpitations Gastrointestinal: negative for abdominal pain and change in bowel habits.   Musculoskeletal:negative for myalgias Neurological: negative for gait problems and tremors Behavioral/Psych: negative for abusive relationship, depression Endocrine: negative for temperature intolerance   Genitourinary:negative for abnormal menstrual periods, genital lesions, hot flashes, sexual problems and vaginal discharge Integument/breast: negative for breast lump, breast tenderness, nipple discharge and skin lesion(s)    Objective:       BP 115/76   Pulse 78   Temp 97.1 F (36.2 C)   Ht 5\' 4"  (1.626 m)   Wt 141 lb (64 kg)   LMP 09/28/2015 (Exact Date)   Breastfeeding? No   BMI 24.20 kg/m  General:   alert  Skin:   no rash or abnormalities  Lungs:   clear to auscultation  bilaterally  Heart:   regular rate and rhythm, S1, S2 normal, no murmur, click, rub or gallop  Breasts:   normal without suspicious masses, skin or nipple changes or axillary nodes  Abdomen:  normal findings: no organomegaly, soft, non-tender and no hernia  Pelvis:  External genitalia: normal general appearance Urinary system: urethral meatus normal and bladder without fullness, nontender Vaginal: normal without tenderness, induration or masses Cervix: normal appearance Adnexa: normal bimanual exam Uterus: anteverted and non-tender, normal size   Lab Review Urine pregnancy test Labs reviewed  yes Radiologic studies reviewed no  50% of 20 min visit spent on counseling and coordination of care.   Assessment:    Healthy female exam.    Contraceptive Counseling and Advice.  Does not desire contraception at this time.    Plan:    Education reviewed: depression evaluation, low fat, low cholesterol diet, safe sex/STD prevention, self breast exams and weight bearing exercise. Contraception: none. Follow up in: 1 year.   No orders of the defined types were placed in this encounter.  No orders of the defined types were placed in this encounter.    Patient ID: Brittney [name], female   DOB: 08/05/1990, 25 y.o.   MRN: [id number]

## 2015-10-20 LAB — PAP IG W/ RFLX HPV ASCU: PAP SMEAR COMMENT: 0

## 2015-10-21 ENCOUNTER — Other Ambulatory Visit: Payer: Self-pay | Admitting: Obstetrics

## 2015-10-21 ENCOUNTER — Telehealth: Payer: Self-pay | Admitting: *Deleted

## 2015-10-21 DIAGNOSIS — N760 Acute vaginitis: Principal | ICD-10-CM

## 2015-10-21 DIAGNOSIS — B3731 Acute candidiasis of vulva and vagina: Secondary | ICD-10-CM

## 2015-10-21 DIAGNOSIS — B9689 Other specified bacterial agents as the cause of diseases classified elsewhere: Secondary | ICD-10-CM

## 2015-10-21 DIAGNOSIS — B373 Candidiasis of vulva and vagina: Secondary | ICD-10-CM

## 2015-10-21 LAB — NUSWAB VG+, CANDIDA 6SP
ATOPOBIUM VAGINAE: HIGH {score} — AB
CANDIDA ALBICANS, NAA: POSITIVE — AB
CANDIDA GLABRATA, NAA: NEGATIVE
CANDIDA KRUSEI, NAA: NEGATIVE
CANDIDA TROPICALIS, NAA: NEGATIVE
Candida lusitaniae, NAA: NEGATIVE
Candida parapsilosis, NAA: NEGATIVE
Chlamydia trachomatis, NAA: NEGATIVE
NEISSERIA GONORRHOEAE, NAA: NEGATIVE
Trich vag by NAA: NEGATIVE

## 2015-10-21 MED ORDER — METRONIDAZOLE 500 MG PO TABS: 500.0000 mg | ORAL_TABLET | Freq: Two times a day (BID) | ORAL | 2 refills | Status: DC

## 2015-10-21 MED ORDER — FLUCONAZOLE 150 MG PO TABS
150.0000 mg | ORAL_TABLET | Freq: Once | ORAL | 2 refills | Status: AC
Start: 2015-10-21 — End: 2015-10-21

## 2015-10-21 NOTE — Telephone Encounter (Signed)
Patient made aware of lab result and medication sent to pharmacy.Cautioned not to drink alcohol with this medication.The pharmacy will notify her when her PNV comes in.

## 2016-02-01 ENCOUNTER — Emergency Department (HOSPITAL_COMMUNITY)
Admission: EM | Admit: 2016-02-01 | Discharge: 2016-02-01 | Disposition: A | Discharge status: Home / Self Care | Payer: Medicaid Other | Attending: Emergency Medicine | Admitting: Emergency Medicine

## 2016-02-01 ENCOUNTER — Encounter (HOSPITAL_COMMUNITY): Payer: Self-pay | Admitting: *Deleted

## 2016-02-01 ENCOUNTER — Emergency Department (HOSPITAL_COMMUNITY): Payer: Medicaid Other

## 2016-02-01 DIAGNOSIS — S29012A Strain of muscle and tendon of back wall of thorax, initial encounter: Secondary | ICD-10-CM | POA: Diagnosis not present

## 2016-02-01 DIAGNOSIS — S39012A Strain of muscle, fascia and tendon of lower back, initial encounter: Secondary | ICD-10-CM | POA: Insufficient documentation

## 2016-02-01 DIAGNOSIS — Y929 Unspecified place or not applicable: Secondary | ICD-10-CM | POA: Insufficient documentation

## 2016-02-01 DIAGNOSIS — Y990 Civilian activity done for income or pay: Secondary | ICD-10-CM | POA: Insufficient documentation

## 2016-02-01 DIAGNOSIS — X500XXA Overexertion from strenuous movement or load, initial encounter: Secondary | ICD-10-CM | POA: Insufficient documentation

## 2016-02-01 DIAGNOSIS — Y939 Activity, unspecified: Secondary | ICD-10-CM | POA: Diagnosis not present

## 2016-02-01 DIAGNOSIS — S3992XA Unspecified injury of lower back, initial encounter: Secondary | ICD-10-CM | POA: Diagnosis present

## 2016-02-01 DIAGNOSIS — T148XXA Other injury of unspecified body region, initial encounter: Secondary | ICD-10-CM

## 2016-02-01 DIAGNOSIS — M62830 Muscle spasm of back: Secondary | ICD-10-CM

## 2016-02-01 LAB — URINALYSIS, ROUTINE W REFLEX MICROSCOPIC
Bilirubin Urine: NEGATIVE
Glucose, UA: NEGATIVE mg/dL
Hgb urine dipstick: NEGATIVE
Ketones, ur: NEGATIVE mg/dL
LEUKOCYTES UA: NEGATIVE
NITRITE: NEGATIVE
PROTEIN: NEGATIVE mg/dL
Specific Gravity, Urine: 1.017 (ref 1.005–1.030)
pH: 8 (ref 5.0–8.0)

## 2016-02-01 LAB — POC URINE PREG, ED: PREG TEST UR: NEGATIVE

## 2016-02-01 MED ORDER — CYCLOBENZAPRINE HCL 10 MG PO TABS: 10.0000 mg | ORAL_TABLET | Freq: Two times a day (BID) | ORAL | 0 refills | Status: DC | PRN

## 2016-02-01 MED ORDER — IBUPROFEN 800 MG PO TABS
800.0000 mg | ORAL_TABLET | Freq: Once | ORAL | Status: AC
  Administered 2016-02-01: 800 mg via ORAL
  Filled 2016-02-01: qty 1

## 2016-02-01 MED ORDER — IBUPROFEN 800 MG PO TABS: 800.0000 mg | ORAL_TABLET | Freq: Three times a day (TID) | ORAL | 0 refills | Status: DC

## 2016-02-01 NOTE — ED Provider Notes (Signed)
[facility]-EMERGENCY DEPT Provider Note   CSN: [id number] Arrival date & time: 02/01/16  1156     History   Chief Complaint Chief Complaint  Patient presents with  . Back Pain    HPI Brittney [name] is a 25 y.o. 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 Patient poor she felt a shooting pain go from her right lower back up to her right shoulder. Patient continues to have sharp pains in the same manner. This occurred last evening. Patient has not tried any interventions at home. Patient denies any fevers, weight loss, recent surgeries, known cancer, saddle anesthesia, bowel/bladder incontinence, numbness or tingling, chest pain, shortness of breath, abdominal pain, nausea, vomiting. Patient reports she has had some tingling on urination for the past 2 days. She also reports she has not been drinking very much water lately.  HPI  Past Medical History:  Diagnosis Date  . Medical history non-contributory     Patient Active Problem List   Diagnosis Date Noted  . Groin swelling 04/14/2015  . Eczema 12/23/2014    Past Surgical History:  Procedure Laterality Date  . NO PAST SURGERIES      OB History    Gravida Para Term Preterm AB Living   3 2 1 1 1 2    SAB TAB Ectopic Multiple Live Births       1 0 2       Home Medications    Prior to Admission medications   Medication Sig Start Date End Date Taking? Authorizing Provider  cyclobenzaprine (FLEXERIL) 10 MG tablet Take 1 tablet (10 mg total) by mouth 2 (two) times daily as needed for muscle spasms. 02/01/16   Emi Holes, PA-C  ibuprofen (ADVIL,MOTRIN) 800 MG tablet Take 1 tablet (800 mg total) by mouth 3 (three) times daily. 02/01/16   Emi Holes, PA-C  metroNIDAZOLE (FLAGYL) 500 MG tablet Take 1 tablet (500 mg total) by mouth 2 (two) times daily. 10/21/15   Brock Bad, MD  Multiple Vitamin (MULTIVITAMIN WITH MINERALS) TABS  tablet Take 1 tablet by mouth daily. Reported on 04/14/2015    Historical Provider, MD  Prenatal-Fe Fum-Methf-FA w/o A (VITAFOL-NANO) 18-0.6-0.4 MG TABS Take 1 tablet by mouth daily before breakfast. 10/15/15   Brock Bad, MD    Family History Family History  Problem Relation Age of Onset  . Diabetes Other   . Hypertension Mother   . Hypertension Father   . Epilepsy Sister   . Epilepsy Brother     Social History Social History  Substance Use Topics  . Smoking status: Never Smoker  . Smokeless tobacco: Never Used  . Alcohol use No     Allergies   Patient has no known allergies.   Review of Systems Review of Systems  Constitutional: Negative for chills and fever.  HENT: Negative for facial swelling and sore throat.   Respiratory: Negative for shortness of breath.   Cardiovascular: Negative for chest pain.  Gastrointestinal: Negative for abdominal pain, nausea and vomiting.  Genitourinary: Positive for dysuria.  Musculoskeletal: Positive for back pain. Negative for neck pain.  Skin: Negative for rash and wound.  Neurological: Negative for numbness.  Psychiatric/Behavioral: The patient is not nervous/anxious.      Physical Exam Updated Vital Signs BP 127/76 (BP Location: Right Arm)   Pulse 94   Temp 97.9 F (36.6 C)   Resp 18   LMP 01/13/2016  SpO2 100%   Physical Exam  Constitutional: She appears well-developed and well-nourished. No distress.  HENT:  Head: Normocephalic and atraumatic.  Mouth/Throat: Oropharynx is clear and moist. No oropharyngeal exudate.  Eyes: Conjunctivae are normal. Pupils are equal, round, and reactive to light. Right eye exhibits no discharge. Left eye exhibits no discharge. No scleral icterus.  Neck: Normal range of motion. Neck supple. No thyromegaly present.  Cardiovascular: Normal rate, regular rhythm, normal heart sounds and intact distal pulses.  Exam reveals no gallop and no friction rub.   No murmur heard. Pulmonary/Chest:  Effort normal and breath sounds normal. No stridor. No respiratory distress. She has no wheezes. She has no rales.  Abdominal: Soft. Bowel sounds are normal. She exhibits no distension. There is no tenderness. There is no rebound and no guarding.  Musculoskeletal: She exhibits no edema.       Thoracic back: She exhibits tenderness and bony tenderness.       Lumbar back: She exhibits tenderness. She exhibits no bony tenderness.       Back:  Lymphadenopathy:    She has no cervical adenopathy.  Neurological: She is alert. Coordination normal.  5/5 strength to all 4 extremities, normal sensation, equal bilateral grip strength, 2+ patellar reflexes bilaterally  Skin: Skin is warm and dry. No rash noted. She is not diaphoretic. No pallor.  Psychiatric: She has a normal mood and affect.  Nursing note and vitals reviewed.    ED Treatments / Results  Labs (all labs ordered are listed, but only abnormal results are displayed) Labs Reviewed  URINALYSIS, ROUTINE W REFLEX MICROSCOPIC  POC URINE PREG, ED    EKG  EKG Interpretation None       Radiology Dg Thoracic Spine 2 View  Result Date: 02/01/2016 CLINICAL DATA:  Thoracic spine injury at 3 a.m. last night trying to turn the patient. Initial encounter. EXAM: THORACIC SPINE 2 VIEWS COMPARISON:  PA and lateral chest 08/04/2014. FINDINGS: There is no evidence of thoracic spine fracture. Alignment is normal. No other significant bone abnormalities are identified. IMPRESSION: Normal exam. Electronically Signed   By: Drusilla Kannerhomas  Dalessio M.D.   On: 02/01/2016 12:52    Procedures Procedures (including critical care time)  Medications Ordered in ED Medications  ibuprofen (ADVIL,MOTRIN) tablet 800 mg (800 mg Oral Given 02/01/16 1315)     Initial Impression / Assessment and Plan / ED Course  I have reviewed the triage vital signs and the nursing notes.  Pertinent labs & imaging results that were available during my care of the patient were  reviewed by me and considered in my medical decision making (see chart for details).  Clinical Course     Patient with back pain.  Due to the midline thoracic tenderness, x-ray obtained. X-ray negative. No neurological deficits and normal neuro exam.  Patient is ambulatory.  No loss of bowel or bladder control.  No concern for cauda equina.  No fever, night sweats, weight loss, h/o cancer, IVDA, no recent procedure to back. UA negative. Upreg negative. Supportive care including heat and stretches, and return precaution discussed. Follow up with PCP if symptoms persist. Patient understands and agrees with plan. Appears safe for discharge at this time. Patient vitals stable throughout ED course and discharged in satisfactory condition.   Final Clinical Impressions(s) / ED Diagnoses   Final diagnoses:  Muscle strain  Muscle spasm of back    New Prescriptions New Prescriptions   CYCLOBENZAPRINE (FLEXERIL) 10 MG TABLET    Take 1 tablet (  10 mg total) by mouth 2 (two) times daily as needed for muscle spasms.   IBUPROFEN (ADVIL,MOTRIN) 800 MG TABLET    Take 1 tablet (800 mg total) by mouth 3 (three) times daily.     Emi Holeslexandra M Deronda Christian, PA-C 02/01/16 1318    Samuel JesterKathleen McManus, DO 02/05/16 1815

## 2016-02-01 NOTE — Discharge Instructions (Signed)
Medications: Ibuprofen, Flexeril  Treatment: Take ibuprofen every 8 hours for your pain. You can alternate with Tylenol as prescribed over-the-counter. Take Flexeril twice daily as needed for muscle pain and spasms. Do not drive or operate machinery when taking this medication. Use a heating pad 3-4 times daily alternating 20 minutes on, 20 minutes off. Attempt the stretches attached 1-2 times daily as tolerated.  Follow-up: If your symptoms are not improving, please follow-up with your primary care provider. Please return to emergency department if you develop any new or worsening symptoms.

## 2016-02-01 NOTE — ED Triage Notes (Signed)
Pt complains of pain in her right torso since last night. 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 Pt states the pain is worse with movement.

## 2016-02-01 NOTE — ED Notes (Signed)
Bed: WTR5 Expected date:  Expected time:  Means of arrival:  Comments: 

## 2016-03-14 IMAGING — US US MISC SOFT TISSUE
1 series · 14 of 16 positions shown · non-contrast
Comparison: No comparison studies available.

CLINICAL DATA: Three day history of right groin swelling.

EXAM:
ULTRASOUND right LOWER EXTREMITY LIMITED
TECHNIQUE: Ultrasound examination of the lower extremity soft tissues was
performed in the area of clinical concern.

[Series 1: us misc soft tissue · 0.07mm/px · 18 acquisitions, 14 frames shown]
[im 1/18]
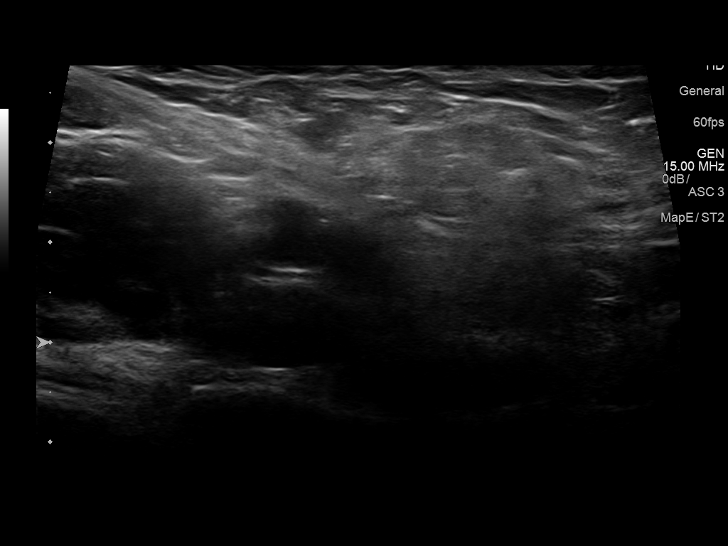
[im 2/18]
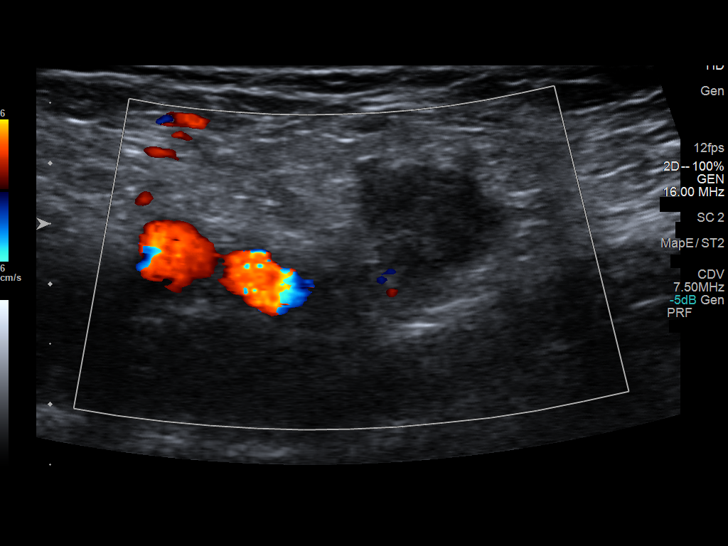
[im 3/18]
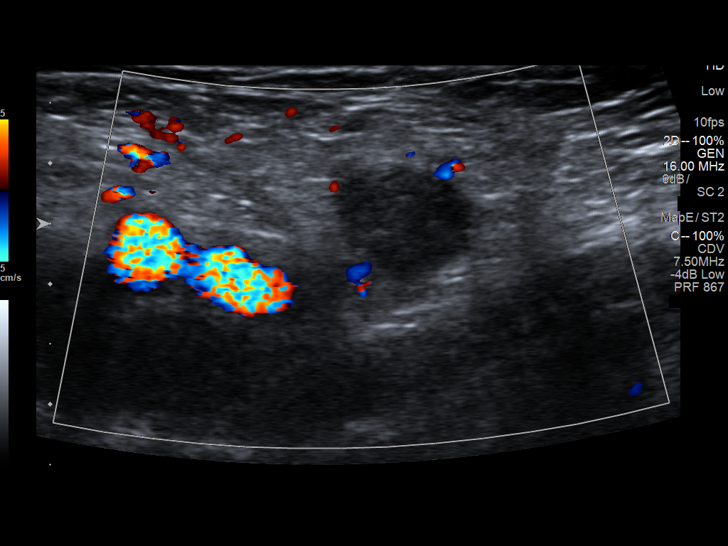
[im 5/18]
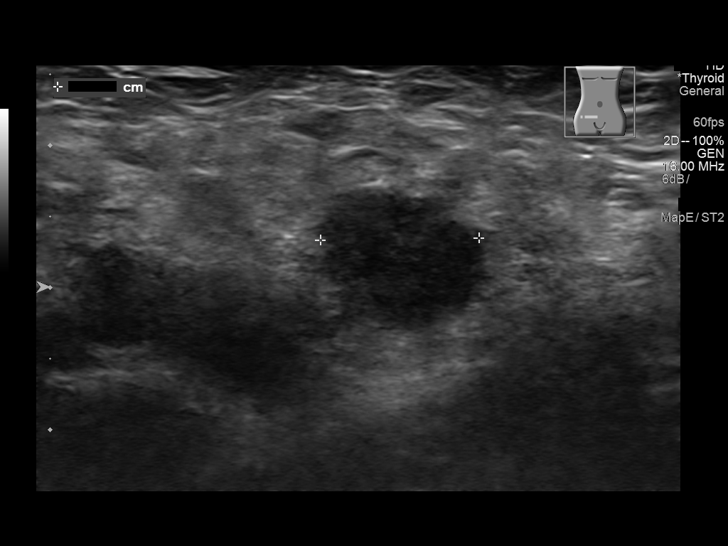
[im 6/18]
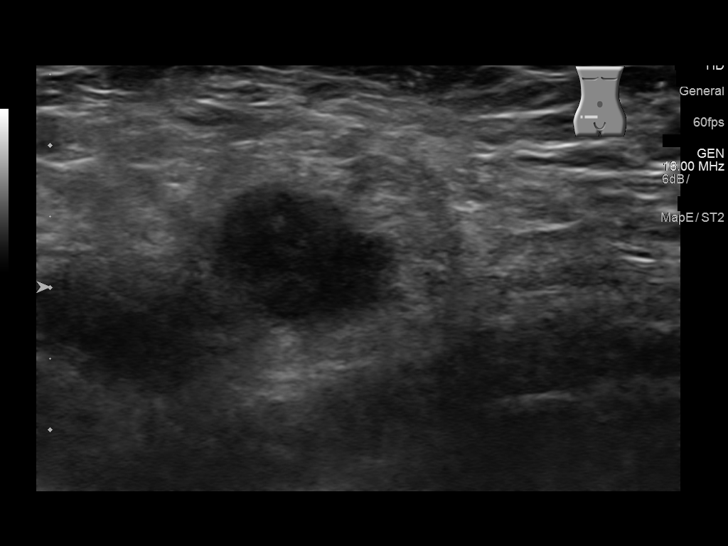
[im 7/18]
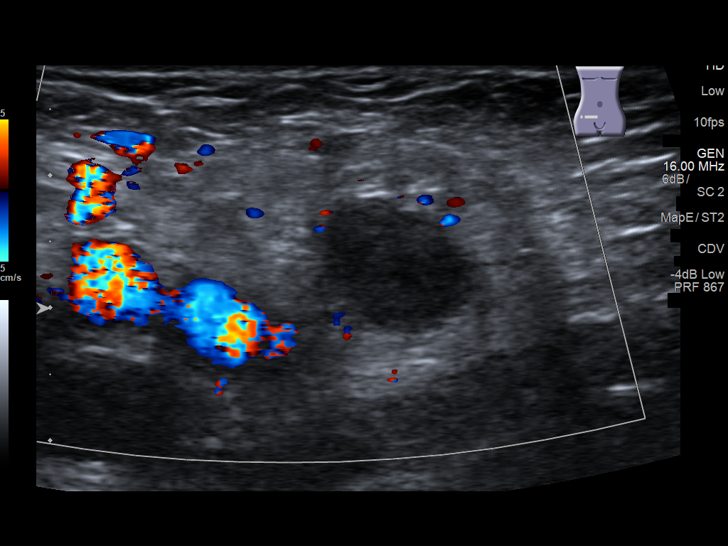
[im 8/18]
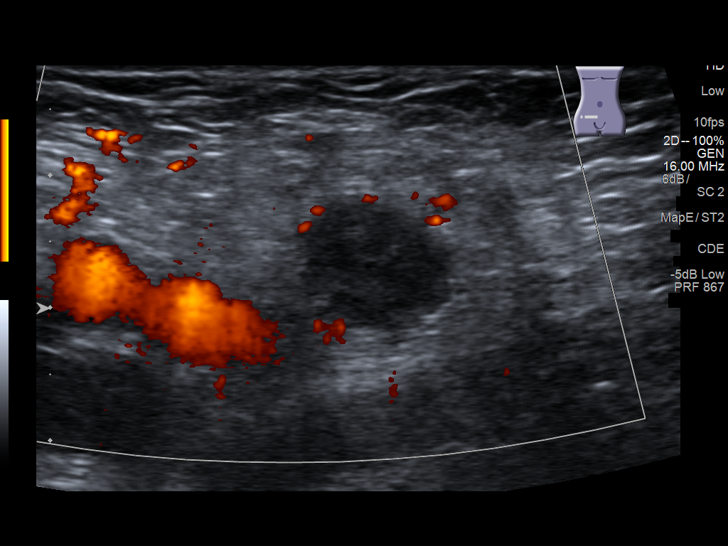
[im 10/18]
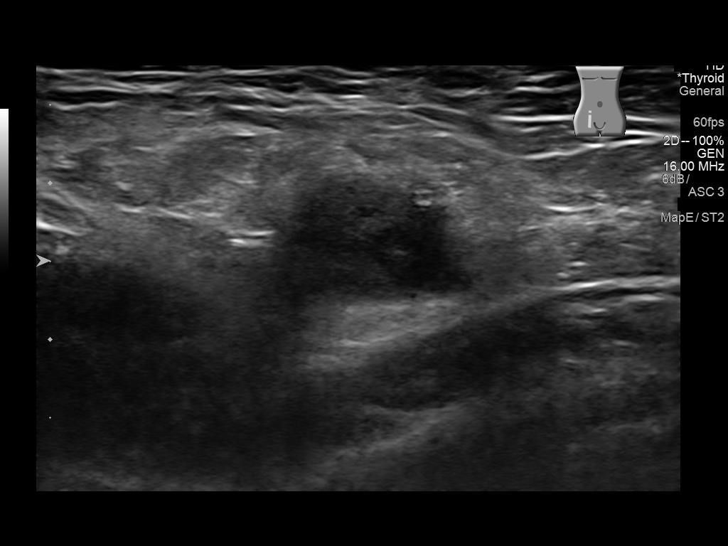
[im 11/18]
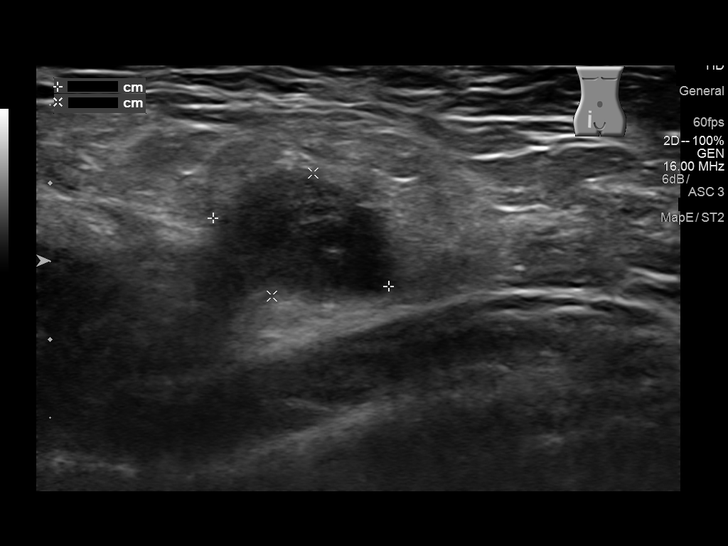
[im 12/18]
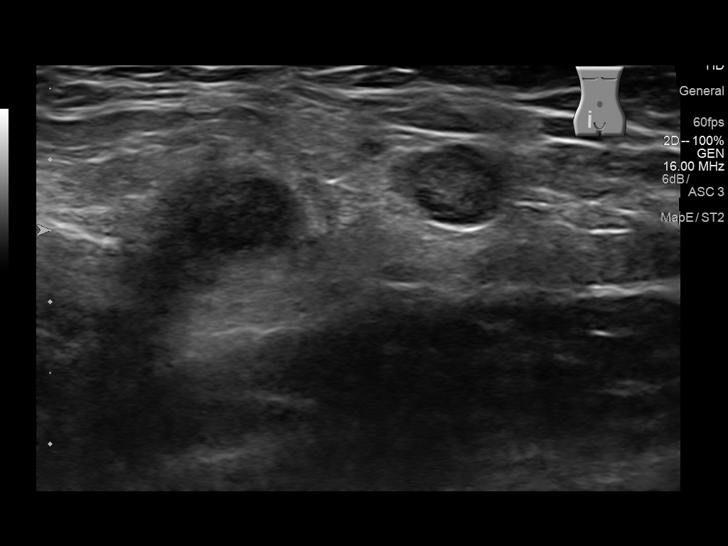
[im 14/18]
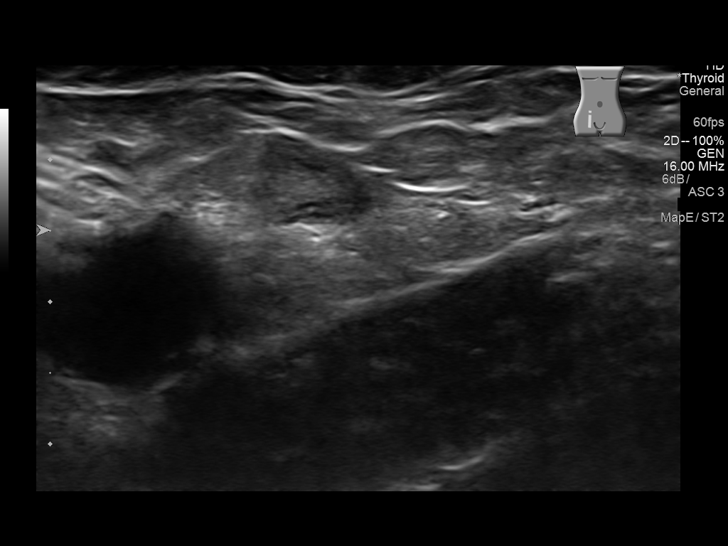
[im 15/18]
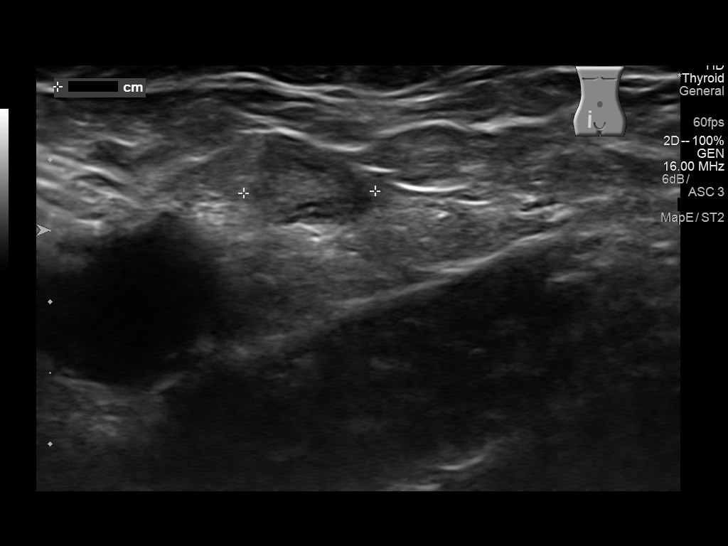
[im 16/18]
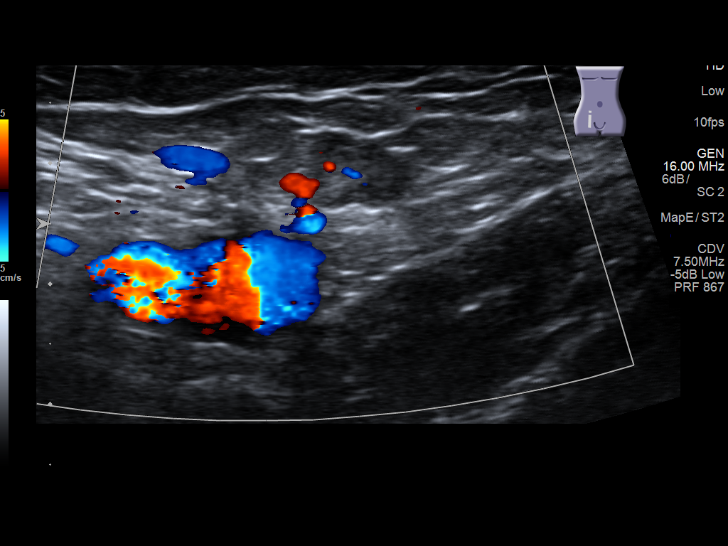
[im 18/18]
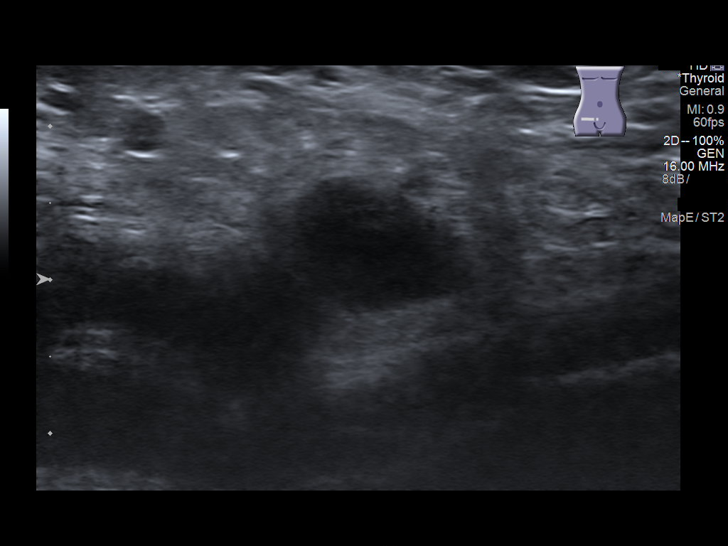

[14 of 16 positions shown; findings below may reference images not displayed]

FINDINGS: Focused ultrasound exam was performed in the right groin area, as
identified by the patient. 1.2 x 0.8 x 1.1 cm hypoechoic nodule is
identified in the region of the right groin. This shows no evidence
for internal flow on color Doppler evaluation, but also has no
substantial enhanced through transmission to suggest cyst. Imaging
features are nonspecific. Small lymph nodes are seen in the same
region.
IMPRESSION: 1.2 cm hypoechoic nodule in the area of patient concern,
nonspecific.

## 2016-04-02 ENCOUNTER — Inpatient Hospital Stay (HOSPITAL_COMMUNITY)
Admission: AD | Admit: 2016-04-02 | Discharge: 2016-04-02 | Disposition: A | Discharge status: Home / Self Care | Payer: Medicaid Other | Source: Ambulatory Visit | Attending: Obstetrics and Gynecology | Admitting: Obstetrics and Gynecology

## 2016-04-02 DIAGNOSIS — Z3202 Encounter for pregnancy test, result negative: Secondary | ICD-10-CM | POA: Diagnosis not present

## 2016-04-02 NOTE — MAU Note (Signed)
History of ectopic pregnancy last year, denies pain or symptoms at this time, LMP 03/04/16.

## 2016-04-02 NOTE — MAU Provider Note (Signed)
S:  [name] is a 26 y.o. female [phone] here in MAU for a pregnancy test. She is without complaints. She denies pain or bleeding at this time. She does have history of an ectopic pregnancy. She is requesting an ultrasound.   O:  GENERAL: Well-developed, well-nourished female in no acute distress.  LUNGS: Effort normal SKIN: Warm, dry and without erythema PSYCH: Normal mood and affect  Vitals:   04/02/16 1049  BP: 121/70  Pulse: 92  Resp: 16  Temp: 98.6 F (37 C)     A:  Missed period   P:  Discharge home in stable condition [facility] Monday to schedule an appointment.  Return to MAU with any pain or vaginal bleeding  Duane LopeJennifer I Rasch, NP 04/02/2016 10:57 AM

## 2016-04-04 ENCOUNTER — Ambulatory Visit (INDEPENDENT_AMBULATORY_CARE_PROVIDER_SITE_OTHER): Payer: Medicaid Other | Admitting: Family Medicine

## 2016-04-04 ENCOUNTER — Other Ambulatory Visit (HOSPITAL_COMMUNITY)
Admission: RE | Admit: 2016-04-04 | Discharge: 2016-04-04 | Disposition: A | Discharge status: Home / Self Care | Payer: Medicaid Other | Source: Ambulatory Visit | Attending: Family Medicine | Admitting: Family Medicine

## 2016-04-04 ENCOUNTER — Encounter: Payer: Self-pay | Admitting: Family Medicine

## 2016-04-04 VITALS — HR 89 | Temp 98.3°F | Ht 64.0 in | Wt 139.0 lb

## 2016-04-04 DIAGNOSIS — Z113 Encounter for screening for infections with a predominantly sexual mode of transmission: Secondary | ICD-10-CM | POA: Diagnosis present

## 2016-04-04 DIAGNOSIS — Z3490 Encounter for supervision of normal pregnancy, unspecified, unspecified trimester: Secondary | ICD-10-CM | POA: Insufficient documentation

## 2016-04-04 DIAGNOSIS — Z3200 Encounter for pregnancy test, result unknown: Secondary | ICD-10-CM | POA: Diagnosis present

## 2016-04-04 DIAGNOSIS — N898 Other specified noninflammatory disorders of vagina: Secondary | ICD-10-CM | POA: Diagnosis not present

## 2016-04-04 DIAGNOSIS — Z3481 Encounter for supervision of other normal pregnancy, first trimester: Secondary | ICD-10-CM

## 2016-04-04 LAB — POCT URINALYSIS DIPSTICK
BILIRUBIN UA: NEGATIVE
Blood, UA: NEGATIVE
GLUCOSE UA: NEGATIVE
KETONES UA: NEGATIVE
LEUKOCYTES UA: NEGATIVE
NITRITE UA: NEGATIVE
PH UA: 7.5
Protein, UA: NEGATIVE
Spec Grav, UA: 1.02
Urobilinogen, UA: 0.2

## 2016-04-04 LAB — POCT WET PREP (WET MOUNT)
Clue Cells Wet Prep Whiff POC: NEGATIVE
Trichomonas Wet Prep HPF POC: ABSENT

## 2016-04-04 LAB — POCT URINE PREGNANCY: Preg Test, Ur: POSITIVE — AB

## 2016-04-04 NOTE — Progress Notes (Signed)
   CC: positive home pregnancy test  HPI History of ectopic pregnancy treated with methotrexate 02/2015. No complications from this. LMP this episode 03/04/16. Prior two periods before this were 12/6 and 11/12. Patient reports periods are regular and she has not been on any birth control. UPT here +, patient says she was excited with the home UPT. Has 28yo and 3yo children at home. Ectopic was at approx 7-8 weeks. She has had some mild cramping over the past few days, but none today and this was not at all like her ectopic pain. No vaginal discharge, but some itching.   CC, SH/smoking status, and VS noted  Objective: Pulse 89   Temp 98.3 F (36.8 C) (Oral)   Ht 5\' 4"  (1.626 m)   Wt 139 lb (63 kg)   LMP 03/04/2016 (Exact Date)   SpO2 99%   BMI 23.86 kg/m  Gen: NAD, alert, cooperative, and pleasant. HEENT: NCAT, EOMI, PERRL CV: RRR, no murmur Resp: CTAB, no wheezes, non-labored Abd: SNTND, BS present, no guarding or organomegaly.  GU: mildly erythematous labia minora. No excoriations or discharge.  Ext: No edema, warm Neuro: Alert and oriented, Speech clear, No gross deficits  Assessment and plan:  Supervision of normal pregnancy UPT positive here. Patient excited. Given history of ectopic, which increases her risk for such up to 8 fold, ordered nonurgent US to confirm IUP. Patient given red flag symptoms to go to [facility]U if present. [facility] Ordered new OB labs.   Orders Placed This Encounter  Procedures  . Culture, OB Urine  . US Transvaginal Non-OB    Standing Status:   Future    Standing Expiration Date:   06/02/2017    Order Specific Question:   Reason for Exam (SYMPTOM  OR DIAGNOSIS REQUIRED)    Answer:   hx of ectopic pregnancy 02/2015, ~4w GA now    Order Specific Question:   Preferred imaging location?    [facility]  . US OB Comp Less 14 Wks    Standing Status:   Future    Standing Expiration Date:   06/03/2016    Order Specific  Question:   Reason for Exam (SYMPTOM  OR DIAGNOSIS REQUIRED)    Answer:   hx of ectopic pregnancy 02/2015, UPT + with ~4w GA now    Order Specific Question:   Preferred imaging location?    Answer:   [facility]  . HIV antibody (with reflex)  . Obstetric panel  . POCT urine pregnancy  . POCT Wet Prep [employer]([place])  . POCT urinalysis dipstick     Loni MuseKate Rajon Bisig, MD, PGY1 04/04/2016 12:21 PM

## 2016-04-04 NOTE — Assessment & Plan Note (Signed)
UPT positive here. Patient excited. Given history of ectopic, which increases her risk for such up to 8 fold, ordered nonurgent US to confirm IUP. Patient given red flag symptoms to go to [facility]U if present. [facility]. Ordered new OB labs.

## 2016-04-04 NOTE — Patient Instructions (Addendum)
It was a pleasure to see you today! Thank you for choosing [facility] for your primary care. Brittney [name] was seen for pregnancy test. Come back to the clinic for your new OB visit. I will call you if your labs are abnormal. Based on your last period, your due date will be 12/09/16 for now. We have scheduled you for an ultrasound.  Best,  Dr. Chanetta Marshallimberlake

## 2016-04-04 NOTE — Addendum Note (Signed)
Addended by: Henri MedalHARTSELL, JAZMIN M on: 04/04/2016 01:08 PM   Modules accepted: Orders

## 2016-04-05 LAB — OBSTETRIC PANEL
ANTIBODY SCREEN: NEGATIVE
BASOS PCT: 0 %
Basophils Absolute: 0 cells/uL (ref 0–200)
EOS PCT: 1 %
Eosinophils Absolute: 86 cells/uL (ref 15–500)
HEMATOCRIT: 36.3 % (ref 35.0–45.0)
Hemoglobin: 12 g/dL (ref 11.7–15.5)
Hepatitis B Surface Ag: NEGATIVE
LYMPHS ABS: 3698 {cells}/uL (ref 850–3900)
LYMPHS PCT: 43 %
MCH: 29.9 pg (ref 27.0–33.0)
MCHC: 33.1 g/dL (ref 32.0–36.0)
MCV: 90.5 fL (ref 80.0–100.0)
MONO ABS: 430 {cells}/uL (ref 200–950)
MPV: 9 fL (ref 7.5–12.5)
Monocytes Relative: 5 %
Neutro Abs: 4386 cells/uL (ref 1500–7800)
Neutrophils Relative %: 51 %
PLATELETS: 344 10*3/uL (ref 140–400)
RBC: 4.01 MIL/uL (ref 3.80–5.10)
RDW: 13.8 % (ref 11.0–15.0)
RH TYPE: POSITIVE
Rubella: 2.55 Index — ABNORMAL HIGH (ref <0.90)
WBC: 8.6 10*3/uL (ref 3.8–10.8)

## 2016-04-05 LAB — CERVICOVAGINAL ANCILLARY ONLY
Chlamydia: NEGATIVE
Neisseria Gonorrhea: NEGATIVE

## 2016-04-05 LAB — CULTURE, OB URINE

## 2016-04-05 LAB — HIV ANTIBODY (ROUTINE TESTING W REFLEX): HIV: NONREACTIVE

## 2016-04-06 ENCOUNTER — Encounter: Payer: Self-pay | Admitting: Family Medicine

## 2016-04-08 ENCOUNTER — Ambulatory Visit (HOSPITAL_COMMUNITY)
Admission: RE | Admit: 2016-04-08 | Discharge: 2016-04-08 | Disposition: A | Discharge status: Home / Self Care | Payer: Medicaid Other | Source: Ambulatory Visit | Attending: Family Medicine | Admitting: Family Medicine

## 2016-04-08 DIAGNOSIS — O283 Abnormal ultrasonic finding on antenatal screening of mother: Secondary | ICD-10-CM | POA: Insufficient documentation

## 2016-04-08 DIAGNOSIS — Z3A01 Less than 8 weeks gestation of pregnancy: Secondary | ICD-10-CM | POA: Insufficient documentation

## 2016-04-08 DIAGNOSIS — Z3481 Encounter for supervision of other normal pregnancy, first trimester: Secondary | ICD-10-CM

## 2016-04-15 ENCOUNTER — Telehealth: Payer: Self-pay | Admitting: Family Medicine

## 2016-04-15 DIAGNOSIS — O200 Threatened abortion: Secondary | ICD-10-CM

## 2016-04-15 NOTE — Telephone Encounter (Signed)
Spoke with pt and the US was already scheduled she said and we scheduled her an appointment on Monday to have her labs drawn. Lamonte SakaiZimmerman Rumple, April D, [place]

## 2016-04-15 NOTE — Telephone Encounter (Signed)
Could you please call this patient and let her know that we need to get some blood work either today or Monday and get a repeat ultrasound to check on the pregnancy because the ultrasound was a little to early to fully evaluate the baby. Could you please schedule a repeat transvaginal US that I will also order?

## 2016-04-18 ENCOUNTER — Other Ambulatory Visit: Payer: Medicaid Other

## 2016-04-18 DIAGNOSIS — O200 Threatened abortion: Secondary | ICD-10-CM

## 2016-04-19 LAB — HCG, QUANTITATIVE, PREGNANCY: HCG, BETA CHAIN, QUANT, S: 16644.4 m[IU]/mL — AB

## 2016-04-20 ENCOUNTER — Encounter (HOSPITAL_COMMUNITY): Payer: Self-pay

## 2016-04-20 ENCOUNTER — Emergency Department (HOSPITAL_COMMUNITY)
Admission: EM | Admit: 2016-04-20 | Discharge: 2016-04-20 | Disposition: A | Discharge status: Home / Self Care | Payer: Medicaid Other | Attending: Emergency Medicine | Admitting: Emergency Medicine

## 2016-04-20 DIAGNOSIS — Z79899 Other long term (current) drug therapy: Secondary | ICD-10-CM | POA: Diagnosis not present

## 2016-04-20 DIAGNOSIS — O26891 Other specified pregnancy related conditions, first trimester: Secondary | ICD-10-CM | POA: Diagnosis present

## 2016-04-20 DIAGNOSIS — Z3A01 Less than 8 weeks gestation of pregnancy: Secondary | ICD-10-CM | POA: Insufficient documentation

## 2016-04-20 DIAGNOSIS — R1032 Left lower quadrant pain: Secondary | ICD-10-CM

## 2016-04-20 NOTE — ED Notes (Signed)
ED Provider at bedside. 

## 2016-04-20 NOTE — ED Provider Notes (Signed)
[facility]-EMERGENCY DEPT Provider Note   CSN: [id number] Arrival date & time: 04/20/16  1406   History   Chief Complaint Chief Complaint  Patient presents with  . Abdominal Pain  . pregnant    HPI Tayna Smethurst is a 26 y.o. female.  HPI   26 year old female presents today for ultrasound.  Patient reports that she is pregnant, LMP March 04, 2016.  She notes that she had initial ultrasound that was questionable for gestational sac.  She is significantly concerned that she has had an ectopic pregnancy in the past.  Patient denies any abdominal pain, she does note a burning sensation in her left groin with pressure, but no pain.  Patient has no infectious etiology.  No vaginal bleeding or discharge.   Past Medical History:  Diagnosis Date  . Medical history non-contributory     Patient Active Problem List   Diagnosis Date Noted  . Supervision of normal pregnancy 04/04/2016  . Groin swelling 04/14/2015  . Eczema 12/23/2014    Past Surgical History:  Procedure Laterality Date  . NO PAST SURGERIES      OB History    Gravida Para Term Preterm AB Living   4 2 1 1 1 2    SAB TAB Ectopic Multiple Live Births       1 0 2       Home Medications    Prior to Admission medications   Medication Sig Start Date End Date Taking? Authorizing Provider  Prenatal-Fe Fum-Methf-FA w/o A (VITAFOL-NANO) 18-0.6-0.4 MG TABS Take 1 tablet by mouth daily before breakfast. 10/15/15  Yes Brock Bad, MD    Family History Family History  Problem Relation Age of Onset  . Diabetes Other   . Hypertension Mother   . Hypertension Father   . Epilepsy Sister   . Epilepsy Brother     Social History Social History  Substance Use Topics  . Smoking status: Never Smoker  . Smokeless tobacco: Never Used  . Alcohol use No     Allergies   Patient has no known allergies.   Review of Systems Review of Systems  All other systems reviewed and are negative.    Physical  Exam Updated Vital Signs BP 109/63 (BP Location: Right Arm)   Pulse 77   Temp 98.2 F (36.8 C) (Oral)   Resp 16   LMP 03/04/2016 (Exact Date)   SpO2 100%   Physical Exam  Constitutional: She is oriented to person, place, and time. She appears well-developed and well-nourished.  HENT:  Head: Normocephalic and atraumatic.  Eyes: Conjunctivae are normal. Pupils are equal, round, and reactive to light. Right eye exhibits no discharge. Left eye exhibits no discharge. No scleral icterus.  Neck: Normal range of motion. No JVD present. No tracheal deviation present.  Pulmonary/Chest: Effort normal. No stridor.  Abdominal: Soft. She exhibits no distension and no mass. There is no tenderness. There is no rebound and no guarding. No hernia.  Musculoskeletal:  Left groin atraumatic, no rash no signs of infection no swelling or edema.  Nontender to palpation  Neurological: She is alert and oriented to person, place, and time. Coordination normal.  Psychiatric: She has a normal mood and affect. Her behavior is normal. Judgment and thought content normal.  Nursing note and vitals reviewed.    ED Treatments / Results  Labs (all labs ordered are listed, but only abnormal results are displayed) Labs Reviewed - No data to display  EKG  EKG Interpretation None  Radiology No results found.  Procedures Procedures (including critical care time)  Medications Ordered in ED Medications - No data to display   Initial Impression / Assessment and Plan / ED Course  I have reviewed the triage vital signs and the nursing notes.  Pertinent labs & imaging results that were available during my care of the patient were reviewed by me and considered in my medical decision making (see chart for details).     Final Clinical Impressions(s) / ED Diagnoses   Final diagnoses:  Discomfort of left groin  Less than [redacted] weeks gestation of pregnancy     Labs:   Imaging: Bedside ultrasound  performed by Melene Planan Floyd DO  Consults:  Therapeutics:  Discharge Meds:   Assessment/Plan: 26 year old female presents today for ultrasound.  Patient has history of ectopic, no abdominal pain here despite chief complaint of abdominal pain.  Patient's discomfort is not pain but abnormal sensation in the left groin.  She has no infectious etiology.  Bedside ultrasound shows most likely intrauterine pregnancy.  No signs consistent with ectopic pregnancy patient does have a follow-up ultrasound in 2 days.  She is encouraged to follow-up with OB/GYN for formal ultrasound.  Patient is given strict return precautions, verbalized understanding and agreement to today's plan had no further questions or concerns     New Prescriptions Discharge Medication List as of 04/20/2016  5:49 PM       Eyvonne MechanicJeffrey Johnnay Pleitez, PA-C 04/20/16 1800    Melene Planan Floyd, DO 04/21/16 0045

## 2016-04-20 NOTE — ED Triage Notes (Signed)
Pt is [redacted] weeks pregnant.  Pt has hx of ectopic pregnancy.  Pt had ultrasound 2 weeks ago but was not given verbal that not ectopic.  Has ultrasound scheduled for Friday.  However, having left lower abdominal burning and is concerned.  No bleeding.

## 2016-04-20 NOTE — Discharge Instructions (Signed)
Please follow-up with your OB/GYN as previously scheduled for follow-up ultrasound.  If you develop any new or worsening signs or symptoms please return for repeat evaluation.

## 2016-04-22 ENCOUNTER — Ambulatory Visit (HOSPITAL_COMMUNITY)
Admission: RE | Admit: 2016-04-22 | Discharge: 2016-04-22 | Disposition: A | Discharge status: Home / Self Care | Payer: Medicaid Other | Source: Ambulatory Visit | Attending: Family Medicine | Admitting: Family Medicine

## 2016-04-22 DIAGNOSIS — Z3A01 Less than 8 weeks gestation of pregnancy: Secondary | ICD-10-CM | POA: Insufficient documentation

## 2016-04-22 DIAGNOSIS — N83202 Unspecified ovarian cyst, left side: Secondary | ICD-10-CM | POA: Insufficient documentation

## 2016-04-22 DIAGNOSIS — O3481 Maternal care for other abnormalities of pelvic organs, first trimester: Secondary | ICD-10-CM | POA: Insufficient documentation

## 2016-04-22 DIAGNOSIS — O200 Threatened abortion: Secondary | ICD-10-CM | POA: Diagnosis not present

## 2016-05-04 ENCOUNTER — Encounter: Payer: Medicaid Other | Admitting: Internal Medicine

## 2016-05-09 ENCOUNTER — Ambulatory Visit (INDEPENDENT_AMBULATORY_CARE_PROVIDER_SITE_OTHER): Payer: Medicaid Other | Admitting: Family Medicine

## 2016-05-09 ENCOUNTER — Encounter: Payer: Self-pay | Admitting: Family Medicine

## 2016-05-09 VITALS — BP 104/68 | HR 74 | Temp 98.3°F | Wt 139.4 lb

## 2016-05-09 DIAGNOSIS — Z3481 Encounter for supervision of other normal pregnancy, first trimester: Secondary | ICD-10-CM

## 2016-05-09 NOTE — Progress Notes (Signed)
INITIAL VISIT:  Brittney [name] is a 26 y.o. yo [phone] at Unknown who presents for her initial prenatal visit. Pregnancy is not planned She reports mild cramping, tiredness. . She  is taking PNV. See flow sheet for details.  Trouble gaining weight. 155 pre pregnancy, now 139. Eating ok.   Notably on review of past pregnancies, second son had jaundice stayed in NICU for 2 weeks for phototherapy and pitocin was used during this labor.   PMH, POBH, FH, meds, allergies and Social Hx reviewed.  Prenatal Exam: Gen: Well nourished, well developed.  No distress.  Vitals noted. HEENT: Normocephalic, atraumatic.  Neck supple without cervical lymphadenopathy, thyromegaly or thyroid nodules.  Fair dentition. CV: RRR no murmur, gallops or rubs Lungs: CTAB.  Normal respiratory effort without wheezes or rales. Abd: soft, NTND. +BS.  Uterus not appreciated above pelvis. GU: Normal external female genitalia without lesions.  Normal vaginal, well rugated without lesions. No vaginal discharge.  Bimanual exam: No adnexal mass or TTP. No CMT.  Uterus size not palpable above pelvis.  Ext: No clubbing, cyanosis or edema. Psych: Normal grooming and dress.  Not depressed or anxious appearing.  Normal thought content and process without flight of ideas or looseness of associations.  Assessment & Plan: 1) 26 y.o. yo [id number] at Unknown via early ultrasound doing well.  Current pregnancy issues include low weight gain - patient reports 155 pre pregnancy and 139 now. She says with both earlier pregnancies her weight decreased during the first trimester and increased later in pregnancy. Will continue to monitor. She is eating well. Dating is reliable. Prenatal labs reviewed, notable for no abnormalities. Genetic screening offered: patient declined first trimester screen - wants quad screen. Early glucola is not indicated.   Bleeding and pain precautions reviewed. Importance of prenatal vitamins reviewed.   Follow up in 4 weeks.

## 2016-05-09 NOTE — Patient Instructions (Signed)
See you in 4 weeks. Go to [facility] if you have bleeding or severe cramping. Keep taking your prenatal vitamins!    First Trimester of Pregnancy The first trimester of pregnancy is from week 1 until the end of week 13 (months 1 through 3). During this time, your baby will begin to develop inside you. At 6-8 weeks, the eyes and face are formed, and the heartbeat can be seen on ultrasound. At the end of 12 weeks, all the baby's organs are formed. Prenatal care is all the medical care you receive before the birth of your baby. Make sure you get good prenatal care and follow all of your doctor's instructions. Follow these instructions at home: Medicines   Take over-the-counter and prescription medicines only as told by your doctor. Some medicines are safe and some medicines are not safe during pregnancy.  Take a prenatal vitamin that contains at least 600 micrograms (mcg) of folic acid.  If you have trouble pooping (constipation), take medicine that will make your stool soft (stool softener) if your doctor approves. Eating and drinking   Eat regular, healthy meals.  Your doctor will tell you the amount of weight gain that is right for you.  Avoid raw meat and uncooked cheese.  If you feel sick to your stomach (nauseous) or throw up (vomit):  Eat 4 or 5 small meals a day instead of 3 large meals.  Try eating a few soda crackers.  Drink liquids between meals instead of during meals.  To prevent constipation:  Eat foods that are high in fiber, like fresh fruits and vegetables, whole grains, and beans.  Drink enough fluids to keep your pee (urine) clear or pale yellow. Activity   Exercise only as told by your doctor. Stop exercising if you have cramps or pain in your lower belly (abdomen) or low back.  Do not exercise if it is too hot, too humid, or if you are in a place of great height (high altitude).  Try to avoid standing for long periods of time. Move your legs often if you must  stand in one place for a long time.  Avoid heavy lifting.  Wear low-heeled shoes. Sit and stand up straight.  You can have sex unless your doctor tells you not to. Relieving pain and discomfort   Wear a good support bra if your breasts are sore.  Take warm water baths (sitz baths) to soothe pain or discomfort caused by hemorrhoids. Use hemorrhoid cream if your doctor says it is okay.  Rest with your legs raised if you have leg cramps or low back pain.  If you have puffy, bulging veins (varicose veins) in your legs:  Wear support hose or compression stockings as told by your doctor.  Raise (elevate) your feet for 15 minutes, 3-4 times a day.  Limit salt in your food. Prenatal care   Schedule your prenatal visits by the twelfth week of pregnancy.  Write down your questions. Take them to your prenatal visits.  Keep all your prenatal visits as told by your doctor. This is important. Safety   Wear your seat belt at all times when driving.  Make a list of emergency phone numbers. The list should include numbers for family, friends, the hospital, and police and fire departments. General instructions   Ask your doctor for a referral to a local prenatal class. Begin classes no later than at the start of month 6 of your pregnancy.  Ask for help if you need counseling  or if you need help with nutrition. Your doctor can give you advice or tell you where to go for help.  Do not use hot tubs, steam rooms, or saunas.  Do not douche or use tampons or scented sanitary pads.  Do not cross your legs for long periods of time.  Avoid all herbs and alcohol. Avoid drugs that are not approved by your doctor.  Do not use any tobacco products, including cigarettes, chewing tobacco, and electronic cigarettes. If you need help quitting, ask your doctor. You may get counseling or other support to help you quit.  Avoid cat litter boxes and soil used by cats. These carry germs that can cause birth  defects in the baby and can cause a loss of your baby (miscarriage) or stillbirth.  Visit your dentist. At home, brush your teeth with a soft toothbrush. Be gentle when you floss. Contact a doctor if:  You are dizzy.  You have mild cramps or pressure in your lower belly.  You have a nagging pain in your belly area.  You continue to feel sick to your stomach, you throw up, or you have watery poop (diarrhea).  You have a bad smelling fluid coming from your vagina.  You have pain when you pee (urinate).  You have increased puffiness (swelling) in your face, hands, legs, or ankles. Get help right away if:  You have a fever.  You are leaking fluid from your vagina.  You have spotting or bleeding from your vagina.  You have very bad belly cramping or pain.  You gain or lose weight rapidly.  You throw up blood. It may look like coffee grounds.  You are around people who have [place] measles, fifth disease, or chickenpox.  You have a very bad headache.  You have shortness of breath.  You have any kind of trauma, such as from a fall or a car accident. Summary  The first trimester of pregnancy is from week 1 until the end of week 13 (months 1 through 3).  To take care of yourself and your unborn baby, you will need to eat healthy meals, take medicines only if your doctor tells you to do so, and do activities that are safe for you and your baby.  Keep all follow-up visits as told by your doctor. This is important as your doctor will have to ensure that your baby is healthy and growing well. This information is not intended to replace advice given to you by your health care provider. Make sure you discuss any questions you have with your health care provider. Document Released: 07/13/2007 Document Revised: 02/02/2016 Document Reviewed: 02/02/2016 Elsevier Interactive Patient Education  2017 [employer].

## 2016-05-11 ENCOUNTER — Other Ambulatory Visit (HOSPITAL_COMMUNITY)
Admission: RE | Admit: 2016-05-11 | Discharge: 2016-05-11 | Disposition: A | Discharge status: Home / Self Care | Payer: Medicaid Other | Source: Ambulatory Visit | Attending: Family Medicine | Admitting: Family Medicine

## 2016-05-11 DIAGNOSIS — Z113 Encounter for screening for infections with a predominantly sexual mode of transmission: Secondary | ICD-10-CM | POA: Insufficient documentation

## 2016-05-11 NOTE — Addendum Note (Signed)
Addended by: Shon Hale on: 05/11/2016 01:41 PM   Modules accepted: Orders

## 2016-05-12 LAB — GC/CHLAMYDIA PROBE AMP (~~LOC~~) NOT AT ARMC
Chlamydia: NEGATIVE
NEISSERIA GONORRHEA: NEGATIVE

## 2016-06-01 ENCOUNTER — Encounter (HOSPITAL_COMMUNITY): Payer: Self-pay

## 2016-06-01 ENCOUNTER — Inpatient Hospital Stay (HOSPITAL_COMMUNITY)
Admission: AD | Admit: 2016-06-01 | Discharge: 2016-06-01 | Disposition: A | Discharge status: Home / Self Care | Payer: Medicaid Other | Source: Ambulatory Visit | Attending: Obstetrics and Gynecology | Admitting: Obstetrics and Gynecology

## 2016-06-01 ENCOUNTER — Inpatient Hospital Stay (HOSPITAL_COMMUNITY): Payer: Medicaid Other

## 2016-06-01 DIAGNOSIS — Z833 Family history of diabetes mellitus: Secondary | ICD-10-CM | POA: Diagnosis not present

## 2016-06-01 DIAGNOSIS — Z820 Family history of epilepsy and other diseases of the nervous system: Secondary | ICD-10-CM | POA: Insufficient documentation

## 2016-06-01 DIAGNOSIS — Z3A11 11 weeks gestation of pregnancy: Secondary | ICD-10-CM | POA: Diagnosis not present

## 2016-06-01 DIAGNOSIS — Z8249 Family history of ischemic heart disease and other diseases of the circulatory system: Secondary | ICD-10-CM | POA: Diagnosis not present

## 2016-06-01 DIAGNOSIS — O021 Missed abortion: Secondary | ICD-10-CM | POA: Insufficient documentation

## 2016-06-01 DIAGNOSIS — N939 Abnormal uterine and vaginal bleeding, unspecified: Secondary | ICD-10-CM | POA: Diagnosis present

## 2016-06-01 DIAGNOSIS — O209 Hemorrhage in early pregnancy, unspecified: Secondary | ICD-10-CM

## 2016-06-01 LAB — WET PREP, GENITAL
Clue Cells Wet Prep HPF POC: NONE SEEN
SPERM: NONE SEEN
Trich, Wet Prep: NONE SEEN
YEAST WET PREP: NONE SEEN

## 2016-06-01 MED ORDER — MISOPROSTOL 200 MCG PO TABS: ORAL_TABLET | ORAL | 0 refills | Status: DC

## 2016-06-01 MED ORDER — IBUPROFEN 600 MG PO TABS: 600.0000 mg | ORAL_TABLET | Freq: Four times a day (QID) | ORAL | 0 refills | Status: DC | PRN

## 2016-06-01 MED ORDER — OXYCODONE-ACETAMINOPHEN 5-325 MG PO TABS: 1.0000 | ORAL_TABLET | Freq: Four times a day (QID) | ORAL | 0 refills | Status: DC | PRN

## 2016-06-01 MED ORDER — PROMETHAZINE HCL 12.5 MG PO TABS: 12.5000 mg | ORAL_TABLET | Freq: Four times a day (QID) | ORAL | 0 refills | Status: DC | PRN

## 2016-06-01 NOTE — MAU Note (Signed)
Is bleeding, is 11wks preg.  Has been having brown spotting lat few days.  Was at work, felt a gush. Large area on her underwear, bright red blood. Few small clots.Noting a fishy odor now. Sharp pains in her uterus, like mild contractions98.5

## 2016-06-01 NOTE — MAU Provider Note (Signed)
History     CSN: [id number]  Arrival date and time: 06/01/16 1441   First Provider Initiated Contact with Patient 06/01/16 1458      Chief Complaint  Patient presents with  . Vaginal Bleeding  . Abdominal Pain   HPI  [name] is a 26 y.o. [id number] at [redacted]w[redacted]d who presents to MAU today with complaint of vaginal bleeding. The patient states that she has had brown spotting the last few days and today noted a large gush of blood. She states occasional mild sharp lower abdominal pains. She had [place] on 04/22/16 that showed a small [facility]. Last intercourse was on Sunday. She has not taken anything for pain. She has a history of ectopic requiring MTX with a previous pregnancy. She has been seen at [facility] for this pregnancy.    OB History    Gravida Para Term Preterm AB Living   SAB TAB Ectopic Multiple Live Births       1 0 2      Past Medical History:  Diagnosis Date  . Medical history non-contributory     Past Surgical History:  Procedure Laterality Date  . NO PAST SURGERIES      Family History  Problem Relation Age of Onset  . Diabetes Other   . Hypertension Mother   . Hypertension Father   . Epilepsy Sister   . Epilepsy Brother   . Diabetes Maternal Aunt     Social History  Substance Use Topics  . Smoking status: Never Smoker  . Smokeless tobacco: Never Used  . Alcohol use No    Allergies: No Known Allergies  No prescriptions prior to admission.    Review of Systems  Constitutional: Negative for fever.  Gastrointestinal: Positive for abdominal pain.  Genitourinary: Positive for pelvic pain and vaginal bleeding. Negative for vaginal discharge and vaginal pain.   Physical Exam   Blood pressure 127/77, pulse 82, temperature 97.6 F (36.4 C), resp. rate 18, weight 138 lb (62.6 kg), last menstrual period 03/04/2016, SpO2 100 %.  Physical Exam  Nursing note and vitals reviewed. Constitutional: She is oriented to person, place, and time.  She appears well-developed and well-nourished. No distress.  HENT:  Head: Normocephalic and atraumatic.  Cardiovascular: Normal rate.   Respiratory: Effort normal.  GI: Soft. She exhibits no distension and no mass. There is no tenderness. There is no rebound and no guarding.  Genitourinary: Uterus is enlarged (slightly). Uterus is not tender. Cervix exhibits no motion tenderness, no discharge and no friability. Right adnexum displays no mass and no tenderness. Left adnexum displays no mass and no tenderness. There is bleeding (small) in the vagina. No vaginal discharge found.  Neurological: She is alert and oriented to person, place, and time.  Skin: Skin is warm and dry. No erythema.  Psychiatric: She has a normal mood and affect.  Dilation: Closed Effacement (%): Thick Cervical Position: Posterior Exam by:: Vonzella Nipple, PA-C   Results for orders placed or performed during the hospital encounter of 06/01/16 (from the past 24 hour(s))  Wet prep, genital     Status: Abnormal   Collection Time: 06/01/16  3:23 PM  Result Value Ref Range   Yeast Wet Prep HPF POC NONE SEEN NONE SEEN   Trich, Wet Prep NONE SEEN NONE SEEN   Clue Cells Wet Prep HPF POC NONE SEEN NONE SEEN   WBC, Wet Prep HPF POC FEW (A) NONE SEEN   Sperm  NONE SEEN     US Ob Comp Less 14 Wks  Result Date: 06/01/2016 CLINICAL DATA:  Vaginal bleeding for 3 days. Current assigned gestational age of [redacted] weeks 6 days by prior ultrasound. EXAM: OBSTETRIC <14 WK [place] AND TRANSVAGINAL OB US TECHNIQUE: Both transabdominal and transvaginal ultrasound examinations were performed for complete evaluation of the gestation as well as the maternal uterus, adnexal regions, and pelvic cul-de-sac. Transvaginal technique was performed to assess early pregnancy. COMPARISON:  04/22/2016 FINDINGS: Intrauterine gestational sac: Single Yolk sac:  Not Visualized. Embryo:  Not Visualized. MSD: 25  mm   7 w   3  d Subchorionic hemorrhage:  None visualized.  Maternal uterus/adnexae: Retroverted uterus. Normal appearance of both ovaries. No mass or free fluid identified. IMPRESSION: Single intrauterine gestational sac, with embryo and cardiac activity no longer visualized. Findings meet definitive criteria for failed pregnancy. This follows SRU consensus guidelines: Diagnostic Criteria for Nonviable Pregnancy Early in the First Trimester. [facility] Mis J Med [phone]. Electronically Signed   By: Myles Rosenthal M.D.   On: 06/01/2016 16:28   US Ob Transvaginal  Result Date: 06/01/2016 CLINICAL DATA:  Vaginal bleeding for 3 days. Current assigned gestational age of [redacted] weeks 6 days by prior ultrasound. EXAM: OBSTETRIC <14 WK [place] AND TRANSVAGINAL OB US TECHNIQUE: Both transabdominal and transvaginal ultrasound examinations were performed for complete evaluation of the gestation as well as the maternal uterus, adnexal regions, and pelvic cul-de-sac. Transvaginal technique was performed to assess early pregnancy. COMPARISON:  04/22/2016 FINDINGS: Intrauterine gestational sac: Single Yolk sac:  Not Visualized. Embryo:  Not Visualized. MSD: 25  mm   7 w   3  d Subchorionic hemorrhage:  None visualized. Maternal uterus/adnexae: Retroverted uterus. Normal appearance of both ovaries. No mass or free fluid identified. IMPRESSION: Single intrauterine gestational sac, with embryo and cardiac activity no longer visualized. Findings meet definitive criteria for failed pregnancy. This follows SRU consensus guidelines: Diagnostic Criteria for Nonviable Pregnancy Early in the First Trimester. [facility] Mis J Med [phone]. Electronically Signed   By: Myles Rosenthal M.D.   On: 06/01/2016 16:28    MAU Course  Procedures None  MDM [place] today  O+ blood type in Epic from previous visit Discussed options with patient of expectant management vs Cytotec. Patient would like Rx for Cytotec but unsure if she will take it since she has already started bleeding.  Assessment and Plan  A: Missed  AB  P: Discharge home Rx for Cytotec, Percocet, Ibuprofen and Phenergan given  Bleeding precautions discussed Patient advised to follow-up with [facility] in 1 week for repeat labs and 2 weeks for follow-up with a provider. They will call her with the appointments.  Patient may return to MAU as needed or if her condition were to change or worsen  Marny Lowenstein, PA-C  06/01/2016, 6:22 PM

## 2016-06-01 NOTE — Discharge Instructions (Signed)

## 2016-06-02 LAB — GC/CHLAMYDIA PROBE AMP (~~LOC~~) NOT AT ARMC
Chlamydia: NEGATIVE
NEISSERIA GONORRHEA: NEGATIVE

## 2016-06-06 ENCOUNTER — Encounter: Payer: Medicaid Other | Admitting: Family Medicine

## 2016-06-08 ENCOUNTER — Other Ambulatory Visit: Payer: Medicaid Other

## 2016-06-15 ENCOUNTER — Ambulatory Visit (INDEPENDENT_AMBULATORY_CARE_PROVIDER_SITE_OTHER): Payer: Medicaid Other | Admitting: Medical

## 2016-06-15 ENCOUNTER — Encounter: Payer: Self-pay | Admitting: Medical

## 2016-06-15 ENCOUNTER — Other Ambulatory Visit (HOSPITAL_COMMUNITY)
Admission: RE | Admit: 2016-06-15 | Discharge: 2016-06-15 | Disposition: A | Discharge status: Home / Self Care | Payer: Medicaid Other | Source: Ambulatory Visit | Attending: Medical | Admitting: Medical

## 2016-06-15 VITALS — BP 129/79 | HR 67 | Wt 139.5 lb

## 2016-06-15 DIAGNOSIS — B9689 Other specified bacterial agents as the cause of diseases classified elsewhere: Secondary | ICD-10-CM | POA: Diagnosis not present

## 2016-06-15 DIAGNOSIS — N760 Acute vaginitis: Secondary | ICD-10-CM

## 2016-06-15 DIAGNOSIS — O039 Complete or unspecified spontaneous abortion without complication: Secondary | ICD-10-CM | POA: Diagnosis not present

## 2016-06-15 MED ORDER — METRONIDAZOLE 0.75 % VA GEL: 1.0000 | Freq: Every day | VAGINAL | 0 refills | Status: AC

## 2016-06-15 NOTE — Progress Notes (Signed)
History:  [name] is a 26 y.o. [id number] who presents to clinic today for follow-up after SAB. The patient had a 5362w3d missed AB diagnosed in [facility] on 06/01/16. She was given Cytotec, but states that she had a miscarriage prior to using the Cytotec. She states that she had heavy bleeding with clots for a few days. She stopped bleeding completely 2-3 days ago. She denies pain or bleeding today. She has not had fever.    The following portions of the patient's history were reviewed and updated as appropriate: allergies, current medications, family history, past medical history, social history, past surgical history and problem list.  Review of Systems:  Review of Systems  Constitutional: Negative for fever.  Gastrointestinal: Negative for abdominal pain.  Genitourinary:       Neg - vaginal bleeding      Objective:  Physical Exam BP 129/79   Pulse 67   Wt 139 lb 8 oz (63.3 kg)   LMP 03/04/2016 (Exact Date)   Breastfeeding? Unknown   BMI 23.95 kg/m  Physical Exam  Constitutional: She appears well-developed and well-nourished. No distress.  Cardiovascular: Normal rate.   Pulmonary/Chest: Effort normal.  Abdominal: Soft. She exhibits no distension. There is no tenderness.  Genitourinary: Uterus is not enlarged and not tender. Cervix exhibits no motion tenderness, no discharge and no friability. Right adnexum displays no mass and no tenderness. Left adnexum displays no mass and no tenderness. No erythema or bleeding in the vagina. Vaginal discharge (moderate thin, white discharge with mild odor) found.  Skin: Skin is warm and dry. No erythema.  Psychiatric: She has a normal mood and affect. Her behavior is normal. Judgment and thought content normal.  Vitals reviewed.  Labs and Imaging Aptima sent for BV. Patient declines STD testing.  Quant hCG drawn today to confirm completed AB  MDM Discussed options for birth control. Patient states that she does not intend to try to  conceive soon, but also declines birth control at this time. She plans to use condoms.  Discussed hygiene products and probiotics for recurrent bacterial vaginosis. Patient voiced understanding.  [facility] She declines at this time, but is aware that an appointment can be made at a later time if desired.   Assessment & Plan:  Assessment: SAB Bacterial vaginosis   Plans: Quant hCG drawn today. Patient will be contacted with results Rx for Metrogel sent to patient's pharmacy  Patient to return to [facility] as needed for routine GYN care or sooner if symptoms were to change or worsen  Marny LowensteinWenzel, Brittney N, PA-C 06/15/2016 1:35 PM

## 2016-06-15 NOTE — Patient Instructions (Signed)

## 2016-06-16 ENCOUNTER — Telehealth: Payer: Self-pay

## 2016-06-16 LAB — BETA HCG QUANT (REF LAB): HCG QUANT: 61 m[IU]/mL

## 2016-06-16 LAB — CERVICOVAGINAL ANCILLARY ONLY: BACTERIAL VAGINITIS: POSITIVE — AB

## 2016-06-16 NOTE — Telephone Encounter (Signed)
-----   Message from Julie N Wenzel, PA-C sent at 06/16/2016  1:38 PM EDT ----- Patient also has BV. Rx for Metrogel was given yesterday. Please inform patient of need to take Rx.   Thanks,  Julie 

## 2016-06-16 NOTE — Telephone Encounter (Signed)
-----   Message from Marny LowensteinJulie N Wenzel, PA-C sent at 06/16/2016  1:38 PM EDT ----- Patient also has BV. Rx for Metrogel was given yesterday. Please inform patient of need to take Rx.   Thanks,  Raynelle FanningJulie

## 2016-06-16 NOTE — Telephone Encounter (Signed)
-----   Message from Marny LowensteinJulie N Wenzel, PA-C sent at 06/16/2016  1:22 PM EDT ----- Patient has recent SAB. Her hCG is still slightly elevated, please have her return for repeat hCG in the office in 1 week. We will follow-up until <5.   Thanks,  Raynelle FanningJulie

## 2016-06-16 NOTE — Telephone Encounter (Signed)
Patient inform of test results and need to come back in 1 week for a repeat BHCG. NOT STAT

## 2016-06-16 NOTE — Telephone Encounter (Signed)
Patient has been notified of test result BV.

## 2016-08-02 ENCOUNTER — Encounter (HOSPITAL_COMMUNITY): Payer: Self-pay

## 2016-08-02 ENCOUNTER — Emergency Department (HOSPITAL_COMMUNITY)
Admission: EM | Admit: 2016-08-02 | Discharge: 2016-08-02 | Disposition: A | Discharge status: Home / Self Care | Payer: Medicaid Other | Attending: Emergency Medicine | Admitting: Emergency Medicine

## 2016-08-02 DIAGNOSIS — B359 Dermatophytosis, unspecified: Secondary | ICD-10-CM | POA: Insufficient documentation

## 2016-08-02 DIAGNOSIS — R21 Rash and other nonspecific skin eruption: Secondary | ICD-10-CM | POA: Diagnosis present

## 2016-08-02 MED ORDER — CLOTRIMAZOLE 1 % EX CREA: TOPICAL_CREAM | CUTANEOUS | 0 refills | Status: DC

## 2016-08-02 NOTE — ED Triage Notes (Signed)
Pt complains of ring like rashes for two months She has treated them with ringworm creme and one has gone away, the others have not

## 2016-08-02 NOTE — ED Provider Notes (Signed)
[facility]-EMERGENCY DEPT Provider Note   CSN: [id number] Arrival date & time: 08/02/16  0230     History   Chief Complaint Chief Complaint  Patient presents with  . Rash    HPI Brittney [name] is a 26 y.o. female.  Patient presents to the emergency department with chief complaint rash. She states that she has a itchy rash on her right lower extremity and left lower extremity. She describes rash as being circular and in 2 separate spots. She has been told that the rash is ringworm. She states that she initially had a rash in different location and applied an antifungal with good relief. She is concerned because the rash keeps on popping up in new places. 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 She denies any fevers or chills. Denies any other associated symptoms.   The history is provided by the patient. No language interpreter was used.    Past Medical History:  Diagnosis Date  . Medical history non-contributory     Patient Active Problem List   Diagnosis Date Noted  . Eczema 12/23/2014    Past Surgical History:  Procedure Laterality Date  . NO PAST SURGERIES      OB History    Gravida Para Term Preterm AB Living   4 2 1 1 1 2    SAB TAB Ectopic Multiple Live Births       1 0 2       Home Medications    Prior to Admission medications   Medication Sig Start Date End Date Taking? Authorizing Provider  clotrimazole (LOTRIMIN) 1 % cream Apply to affected area 2 times daily 08/02/16   Roxy Horseman, PA-C  ibuprofen (ADVIL,MOTRIN) 600 MG tablet Take 1 tablet (600 mg total) by mouth every 6 (six) hours as needed. 06/01/16   Marny Lowenstein, PA-C  misoprostol (CYTOTEC) 200 MCG tablet Place 4 tabs (800 mcg) once in the vagina once 06/01/16   Marny Lowenstein, PA-C  oxyCODONE-acetaminophen (PERCOCET/ROXICET) 5-325 MG tablet Take 1-2 tablets by mouth every 6 (six) hours as needed for severe pain. 06/01/16   Marny Lowenstein, PA-C  Prenatal MV-Min-FA-Omega-3 (PRENATAL GUMMIES/DHA & FA)  0.4-32.5 MG CHEW Chew 1 each by mouth daily.    [provider]  promethazine (PHENERGAN) 12.5 MG tablet Take 1 tablet (12.5 mg total) by mouth every 6 (six) hours as needed for nausea or vomiting. 06/01/16   Marny Lowenstein, PA-C    Family History Family History  Problem Relation Age of Onset  . Diabetes Other   . Hypertension Mother   . Hypertension Father   . Epilepsy Sister   . Epilepsy Brother   . Diabetes Maternal Aunt     Social History Social History  Substance Use Topics  . Smoking status: Never Smoker  . Smokeless tobacco: Never Used  . Alcohol use No     Allergies   Patient has no known allergies.   Review of Systems Review of Systems  All other systems reviewed and are negative.    Physical Exam Updated Vital Signs BP 139/78 (BP Location: Left Arm)   Pulse 84   Temp 98.1 F (36.7 C) (Oral)   Resp 18   Ht 5\' 4"  (1.626 m)   Wt 63.5 kg (140 lb)   LMP 07/27/2016   SpO2 100%   BMI 24.03 kg/m   Physical Exam  Constitutional: She is oriented to person, place, and time. She appears well-developed and well-nourished.  HENT:  Head: Normocephalic and atraumatic.  Eyes: Conjunctivae and EOM are normal.  Neck: Normal range of motion.  Cardiovascular: Normal rate.   Pulmonary/Chest: Effort normal.  Abdominal: She exhibits no distension.  Musculoskeletal: Normal range of motion.  Neurological: She is alert and oriented to person, place, and time.  Skin: Skin is dry.  2 separate lesions, one on the right lower extremity, one on the left, which are characteristic of ringworm.  Psychiatric: She has a normal mood and affect. Her behavior is normal. Judgment and thought content normal.  Nursing note and vitals reviewed.    ED Treatments / Results  Labs (all labs ordered are listed, but only abnormal results are displayed) Labs Reviewed - No data to display  EKG  EKG Interpretation None       Radiology No results  found.  Procedures Procedures (including critical care time)  Medications Ordered in ED Medications - No data to display   Initial Impression / Assessment and Plan / ED Course  I have reviewed the triage vital signs and the nursing notes.  Pertinent labs & imaging results that were available during my care of the patient were reviewed by me and considered in my medical decision making (see chart for details).       Final Clinical Impressions(s) / ED Diagnoses   Final diagnoses:  Ringworm    New Prescriptions New Prescriptions   CLOTRIMAZOLE (LOTRIMIN) 1 % CREAM    Apply to affected area 2 times daily     Roxy HorsemanBrowning, Faigy Stretch, PA-C 08/02/16 0350    Gilda CreasePollina, Christopher J, MD 08/07/16 [phone]

## 2016-09-28 ENCOUNTER — Encounter: Payer: Medicaid Other | Admitting: Family Medicine

## 2016-10-11 ENCOUNTER — Encounter: Payer: Self-pay | Admitting: Internal Medicine

## 2016-10-11 ENCOUNTER — Other Ambulatory Visit (HOSPITAL_COMMUNITY)
Admission: RE | Admit: 2016-10-11 | Discharge: 2016-10-11 | Disposition: A | Discharge status: Home / Self Care | Payer: Medicaid Other | Source: Ambulatory Visit | Attending: Family Medicine | Admitting: Family Medicine

## 2016-10-11 ENCOUNTER — Ambulatory Visit (INDEPENDENT_AMBULATORY_CARE_PROVIDER_SITE_OTHER): Payer: Medicaid Other | Admitting: Internal Medicine

## 2016-10-11 VITALS — BP 100/70 | HR 79 | Temp 99.0°F | Wt 135.0 lb

## 2016-10-11 DIAGNOSIS — Z3009 Encounter for other general counseling and advice on contraception: Secondary | ICD-10-CM | POA: Insufficient documentation

## 2016-10-11 DIAGNOSIS — N898 Other specified noninflammatory disorders of vagina: Secondary | ICD-10-CM | POA: Diagnosis present

## 2016-10-11 LAB — POCT WET PREP (WET MOUNT)
CLUE CELLS WET PREP WHIFF POC: NEGATIVE
Trichomonas Wet Prep HPF POC: ABSENT

## 2016-10-11 MED ORDER — FLUCONAZOLE 150 MG PO TABS: 150.0000 mg | ORAL_TABLET | Freq: Once | ORAL | 0 refills | Status: AC

## 2016-10-11 NOTE — Patient Instructions (Addendum)
It was so nice to meet you!  We ordered some labs today. I will call you with your lab results.  I sent in a prescription for Diflucan, in case this is a yeast infection.  -Dr. Nancy MarusMayo

## 2016-10-11 NOTE — Assessment & Plan Note (Signed)
-   Wet prep performed in clinic today was negative - Gonorrhea, chlamydia, HIV, and RPR ordered - Will call patient to discuss results.

## 2016-10-11 NOTE — Assessment & Plan Note (Signed)
Counseled on different options. Would like more time to think about her options. Advised patient that she should get the depo while she is deciding.  - Unable to obtain urine specimen to check urine pregnancy, as patient did not have to urinate and lab was closed - Patient will return tomorrow to nursing clinic for urine pregnancy test and depo shot - Advised that patient refrain from unprotected sexual intercourse until receiving depo

## 2016-10-11 NOTE — Progress Notes (Signed)
   [facility] Phone: [phone]  Subjective:  Brittney HolsterQuonique is a 26 year old female presenting to clinic with vaginal discharge for the last two days. She last had sexual intercourse 4 days ago. She is sexually active with one female partner. She has only had 1 partner in the last 4 months. She does not use condoms or any form of birth control. Her discharge is yellow, but it started as "white and chunky". She endorses itchiness. She has also noticed that her labia is swollen. She used some OTC monostat, which helped the labial swelling. No dysuria, no genital lesions, no abdominal pain, no fevers. She started her period yesterday.  ROS: See HPI for pertinent positives and negatives  Past Medical History- eczema, recurrent BV infections.  Family history reviewed for today's visit. No changes.  Social history- patient is a never smoker  Objective: BP 100/70   Pulse 79   Temp 99 F (37.2 C) (Oral)   Wt 135 lb (61.2 kg)   LMP 10/11/2016   SpO2 99%   BMI 23.17 kg/m  Gen: NAD, alert, cooperative with exam GU: External genitalia normal in appearance, no lesions, labia normal.  Assessment/Plan: Vaginal Discharge: - Wet prep performed in clinic today from self swab was negative - Gonorrhea, chlamydia, HIV, and RPR ordered - Will call patient to discuss results.  Birth control counseling: Counseled on different options. Would like more time to think about her options. Advised patient that she should get the depo while she is deciding.  - Unable to obtain urine specimen to check urine pregnancy, as patient did not have to urinate and lab was closed - Patient will return tomorrow to nursing clinic for urine pregnancy test and depo shot - Advised that patient refrain from unprotected sexual intercourse until receiving depo   Willadean CarolKaty Mayo, MD PGY-3

## 2016-10-12 ENCOUNTER — Ambulatory Visit: Payer: Medicaid Other

## 2016-10-12 LAB — RPR: RPR Ser Ql: NONREACTIVE

## 2016-10-12 LAB — HIV ANTIBODY (ROUTINE TESTING W REFLEX): HIV SCREEN 4TH GENERATION: NONREACTIVE

## 2016-10-13 ENCOUNTER — Telehealth: Payer: Self-pay

## 2016-10-13 LAB — URINE CYTOLOGY ANCILLARY ONLY
Chlamydia: NEGATIVE
NEISSERIA GONORRHEA: NEGATIVE

## 2016-10-13 NOTE — Telephone Encounter (Signed)
Contacted pt and informed her of negative std result.

## 2016-10-13 NOTE — Telephone Encounter (Signed)
-----   Message from Campbell StallKaty Dodd Mayo, MD sent at 10/13/2016 11:30 AM EDT ----- Please let [name] know that her STD testing was completely normal. Thanks!

## 2017-02-07 NOTE — L&D Delivery Note (Signed)
Delivery Note At  a viable female was delivered via  (Presentation:vertex ; LOA ).  APGAR:7 ,9 ; weight  .   Placenta status:spont ,shultz .  Cord: 3vc with the following complications:none .  Cord pH: n/a  Anesthesia:none  Episiotomy:  none Lacerations:  none Suture Repair: n/a Est. Blood Loss 50(mL):    Mom to postpartum.  Baby to Couplet care / Skin to Skin.  Brittney [name] 10/29/2017, 7:27 PM

## 2017-03-29 ENCOUNTER — Other Ambulatory Visit: Payer: Self-pay

## 2017-03-29 ENCOUNTER — Encounter: Payer: Self-pay | Admitting: Internal Medicine

## 2017-03-29 ENCOUNTER — Ambulatory Visit: Payer: Medicaid Other | Admitting: Internal Medicine

## 2017-03-29 VITALS — BP 120/60 | HR 111 | Temp 98.6°F | Ht 64.0 in | Wt 135.0 lb

## 2017-03-29 DIAGNOSIS — Z3200 Encounter for pregnancy test, result unknown: Secondary | ICD-10-CM

## 2017-03-29 DIAGNOSIS — R300 Dysuria: Secondary | ICD-10-CM | POA: Diagnosis not present

## 2017-03-29 DIAGNOSIS — Z3491 Encounter for supervision of normal pregnancy, unspecified, first trimester: Secondary | ICD-10-CM | POA: Insufficient documentation

## 2017-03-29 DIAGNOSIS — Z3201 Encounter for pregnancy test, result positive: Secondary | ICD-10-CM

## 2017-03-29 DIAGNOSIS — N898 Other specified noninflammatory disorders of vagina: Secondary | ICD-10-CM

## 2017-03-29 LAB — POCT URINALYSIS DIP (MANUAL ENTRY)
Bilirubin, UA: NEGATIVE
Blood, UA: NEGATIVE
Glucose, UA: NEGATIVE mg/dL
Leukocytes, UA: NEGATIVE
Nitrite, UA: NEGATIVE
Urobilinogen, UA: 1 E.U./dL
pH, UA: 6 (ref 5.0–8.0)

## 2017-03-29 LAB — POCT WET PREP (WET MOUNT)
Clue Cells Wet Prep Whiff POC: NEGATIVE
TRICHOMONAS WET PREP HPF POC: ABSENT

## 2017-03-29 LAB — POCT URINE PREGNANCY: Preg Test, Ur: POSITIVE — AB

## 2017-03-29 MED ORDER — PRENATAL VITAMINS 28-0.8 MG PO TABS: 1.0000 | ORAL_TABLET | Freq: Every day | ORAL | 3 refills | Status: DC

## 2017-03-29 MED ORDER — PRENATAL GUMMIES/DHA & FA 0.4-32.5 MG PO CHEW: 1.0000 | CHEWABLE_TABLET | Freq: Every day | ORAL | 3 refills | Status: DC

## 2017-03-29 NOTE — Progress Notes (Signed)
   Subjective:   Patient: Brittney [name]       Birthdate: 03/25/1990       MRN: [id number]      HPI  Brittney [name] is a 634P2 27 y.o. female presenting for positive home pregnancy test.   Home pregnancy test Patient was supposed to begin her menstrual period on 02/16. When she did not start her period, she took a home pregnancy test (~4d ago), which was positive. LMP 01/16 and was a regular period. No bleeding, including spotting, since then. Patient has not been on any form of birth control. She is concerned because she had an ectopic pregnancy in 2016 and a miscarriage at 11 wks in 2017. Patient is not currently taking prenatal vitamins. Denies tobacco use, alcohol use, marijuana use, other illicit drug use.  Also endorses vaginal discharge beginning last week. Has history of recurrent BV and says symptoms feel similar. Endorses yellow discharge that is malodorous and pruritic. Denies blood in discharge. Endorses some mild abdominal cramping. Denies fevers, chills.   Smoking status reviewed. Patient is never smoker.   Review of Systems See HPI.     Objective:  Physical Exam  Constitutional: She is oriented to person, place, and time and well-developed, well-nourished, and in no distress.  HENT:  Head: Normocephalic and atraumatic.  Pulmonary/Chest: Effort normal. No respiratory distress.  Genitourinary: Vagina normal, uterus normal and cervix normal.  Genitourinary Comments: Minimal white discharge   Neurological: She is alert and oriented to person, place, and time.  Skin: Skin is warm and dry.  Psychiatric: Affect and judgment normal.   Assessment & Plan:  Positive [facility]. LMP 01/16. Patient concerned about pregnancy due to prior ectopic pregnancy and first trimester miscarriage, however partner is very supportive and patient wishing to proceed with pregnancy.  - PNV prescribed - Initial prenatal labs obtained today - Patient to  schedule initial OB appt today   Vaginal discharge Minimal white discharge on pelvic exam. Of note, patient with significant discomfort on pelvic exam, which she says is normal for her. Patient declining STD testing today. Wet prep neg. Given patient's description of yellow malodorous discharge, concern for STD, however patient declining testing. Would ensure patient has STD testing performed at initial prenatal visit. Discussed that physiologic discharge is normal, and regular discharge may change throughout her pregnancy.    Tarri AbernethyAbigail J Eliyanah Elgersma, MD, MPH PGY-3 [facility] Pager [phone]

## 2017-03-29 NOTE — Patient Instructions (Signed)
It was nice meeting you today Brittney [name]!  Please begin taking your prenatal vitamins. You can read the handout below on things to avoid and what to expect during your first trimester.   We will see you back for your initial prenatal appointment. If you begin to have any vaginal bleeding, discharge, or abdominal pains or severe cramping, please call to let us know or go to the emergency room at [facility].   If you have any questions or concerns, please feel free to call the clinic.   Be well,  Dr. Natale Milch   First Trimester of Pregnancy The first trimester of pregnancy is from week 1 until the end of week 13 (months 1 through 3). During this time, your baby will begin to develop inside you. At 6-8 weeks, the eyes and face are formed, and the heartbeat can be seen on ultrasound. At the end of 12 weeks, all the baby's organs are formed. Prenatal care is all the medical care you receive before the birth of your baby. Make sure you get good prenatal care and follow all of your doctor's instructions. Follow these instructions at home: Medicines  Take over-the-counter and prescription medicines only as told by your doctor. Some medicines are safe and some medicines are not safe during pregnancy.  Take a prenatal vitamin that contains at least 600 micrograms (mcg) of folic acid.  If you have trouble pooping (constipation), take medicine that will make your stool soft (stool softener) if your doctor approves. Eating and drinking  Eat regular, healthy meals.  Your doctor will tell you the amount of weight gain that is right for you.  Avoid raw meat and uncooked cheese.  If you feel sick to your stomach (nauseous) or throw up (vomit): ? Eat 4 or 5 small meals a day instead of 3 large meals. ? Try eating a few soda crackers. ? Drink liquids between meals instead of during meals.  To prevent constipation: ? Eat foods that are high in fiber, like fresh fruits and vegetables, whole  grains, and beans. ? Drink enough fluids to keep your pee (urine) clear or pale yellow. Activity  Exercise only as told by your doctor. Stop exercising if you have cramps or pain in your lower belly (abdomen) or low back.  Do not exercise if it is too hot, too humid, or if you are in a place of great height (high altitude).  Try to avoid standing for long periods of time. Move your legs often if you must stand in one place for a long time.  Avoid heavy lifting.  Wear low-heeled shoes. Sit and stand up straight.  You can have sex unless your doctor tells you not to. Relieving pain and discomfort  Wear a good support bra if your breasts are sore.  Take warm water baths (sitz baths) to soothe pain or discomfort caused by hemorrhoids. Use hemorrhoid cream if your doctor says it is okay.  Rest with your legs raised if you have leg cramps or low back pain.  If you have puffy, bulging veins (varicose veins) in your legs: ? Wear support hose or compression stockings as told by your doctor. ? Raise (elevate) your feet for 15 minutes, 3-4 times a day. ? Limit salt in your food. Prenatal care  Schedule your prenatal visits by the twelfth week of pregnancy.  Write down your questions. Take them to your prenatal visits.  Keep all your prenatal visits as told by your doctor. This is important. Safety  Wear your seat belt at all times when driving.  Make a list of emergency phone numbers. The list should include numbers for family, friends, the hospital, and police and fire departments. General instructions  Ask your doctor for a referral to a local prenatal class. Begin classes no later than at the start of month 6 of your pregnancy.  Ask for help if you need counseling or if you need help with nutrition. Your doctor can give you advice or tell you where to go for help.  Do not use hot tubs, steam rooms, or saunas.  Do not douche or use tampons or scented sanitary pads.  Do not  cross your legs for long periods of time.  Avoid all herbs and alcohol. Avoid drugs that are not approved by your doctor.  Do not use any tobacco products, including cigarettes, chewing tobacco, and electronic cigarettes. If you need help quitting, ask your doctor. You may get counseling or other support to help you quit.  Avoid cat litter boxes and soil used by cats. These carry germs that can cause birth defects in the baby and can cause a loss of your baby (miscarriage) or stillbirth.  Visit your dentist. At home, brush your teeth with a soft toothbrush. Be gentle when you floss. Contact a doctor if:  You are dizzy.  You have mild cramps or pressure in your lower belly.  You have a nagging pain in your belly area.  You continue to feel sick to your stomach, you throw up, or you have watery poop (diarrhea).  You have a bad smelling fluid coming from your vagina.  You have pain when you pee (urinate).  You have increased puffiness (swelling) in your face, hands, legs, or ankles. Get help right away if:  You have a fever.  You are leaking fluid from your vagina.  You have spotting or bleeding from your vagina.  You have very bad belly cramping or pain.  You gain or lose weight rapidly.  You throw up blood. It may look like coffee grounds.  You are around people who have [place] measles, fifth disease, or chickenpox.  You have a very bad headache.  You have shortness of breath.  You have any kind of trauma, such as from a fall or a car accident. Summary  The first trimester of pregnancy is from week 1 until the end of week 13 (months 1 through 3).  To take care of yourself and your unborn baby, you will need to eat healthy meals, take medicines only if your doctor tells you to do so, and do activities that are safe for you and your baby.  Keep all follow-up visits as told by your doctor. This is important as your doctor will have to ensure that your baby is healthy  and growing well. This information is not intended to replace advice given to you by your health care provider. Make sure you discuss any questions you have with your health care provider. Document Released: 07/13/2007 Document Revised: 02/02/2016 Document Reviewed: 02/02/2016 Elsevier Interactive Patient Education  2017 [employer]

## 2017-03-29 NOTE — Assessment & Plan Note (Signed)
Minimal white discharge on pelvic exam. Of note, patient with significant discomfort on pelvic exam, which she says is normal for her. Patient declining STD testing today. Wet prep neg. Given patient's description of yellow malodorous discharge, concern for STD, however patient declining testing. Would ensure patient has STD testing performed at initial prenatal visit. Discussed that physiologic discharge is normal, and regular discharge may change throughout her pregnancy.

## 2017-03-29 NOTE — Assessment & Plan Note (Signed)
[facility] and [facility]. LMP 01/16. Patient concerned about pregnancy due to prior ectopic pregnancy and first trimester miscarriage, however partner is very supportive and patient wishing to proceed with pregnancy.  - PNV prescribed - Initial prenatal labs obtained today - Patient to schedule initial OB appt today

## 2017-03-30 LAB — OBSTETRIC PANEL, INCLUDING HIV
ANTIBODY SCREEN: NEGATIVE
BASOS ABS: 0 10*3/uL (ref 0.0–0.2)
BASOS: 0 %
EOS (ABSOLUTE): 0.1 10*3/uL (ref 0.0–0.4)
Eos: 1 %
HEMATOCRIT: 33.8 % — AB (ref 34.0–46.6)
HIV SCREEN 4TH GENERATION: NONREACTIVE
Hemoglobin: 11.6 g/dL (ref 11.1–15.9)
Hepatitis B Surface Ag: NEGATIVE
Immature Grans (Abs): 0 10*3/uL (ref 0.0–0.1)
Immature Granulocytes: 0 %
LYMPHS ABS: 3.1 10*3/uL (ref 0.7–3.1)
Lymphs: 32 %
MCH: 30.9 pg (ref 26.6–33.0)
MCHC: 34.3 g/dL (ref 31.5–35.7)
MCV: 90 fL (ref 79–97)
Monocytes Absolute: 0.6 10*3/uL (ref 0.1–0.9)
Monocytes: 6 %
NEUTROS ABS: 5.8 10*3/uL (ref 1.4–7.0)
Neutrophils: 61 %
PLATELETS: 318 10*3/uL (ref 150–379)
RBC: 3.76 x10E6/uL — ABNORMAL LOW (ref 3.77–5.28)
RDW: 13.1 % (ref 12.3–15.4)
RH TYPE: POSITIVE
RPR: NONREACTIVE
Rubella Antibodies, IGG: 2.07 index (ref >0.99)
WBC: 9.7 10*3/uL (ref 3.4–10.8)

## 2017-04-03 LAB — URINE CULTURE, OB REFLEX

## 2017-04-03 LAB — CULTURE, OB URINE

## 2017-04-10 ENCOUNTER — Encounter: Payer: Self-pay | Admitting: Internal Medicine

## 2017-04-10 ENCOUNTER — Ambulatory Visit (INDEPENDENT_AMBULATORY_CARE_PROVIDER_SITE_OTHER): Payer: Medicaid Other | Admitting: Internal Medicine

## 2017-04-10 ENCOUNTER — Other Ambulatory Visit (HOSPITAL_COMMUNITY)
Admission: RE | Admit: 2017-04-10 | Discharge: 2017-04-10 | Disposition: A | Discharge status: Home / Self Care | Payer: Medicaid Other | Source: Ambulatory Visit | Attending: Family Medicine | Admitting: Family Medicine

## 2017-04-10 ENCOUNTER — Other Ambulatory Visit: Payer: Self-pay

## 2017-04-10 VITALS — BP 100/60 | HR 88 | Temp 98.5°F | Wt 133.0 lb

## 2017-04-10 DIAGNOSIS — Z3201 Encounter for pregnancy test, result positive: Secondary | ICD-10-CM | POA: Diagnosis present

## 2017-04-10 DIAGNOSIS — Z3491 Encounter for supervision of normal pregnancy, unspecified, first trimester: Secondary | ICD-10-CM

## 2017-04-10 DIAGNOSIS — Z833 Family history of diabetes mellitus: Secondary | ICD-10-CM

## 2017-04-10 MED ORDER — VITAMIN B-6 25 MG PO TABS: 25.0000 mg | ORAL_TABLET | Freq: Three times a day (TID) | ORAL | 1 refills | Status: DC | PRN

## 2017-04-10 NOTE — Progress Notes (Signed)
Brittney [name] is a 27 y.o. yo [id number] at Unknown who presents for her initial prenatal visit. Pregnancy  isplanned She reports nausea. She  isTaking PNV. See flow sheet for details.  PMH, POBH, FH, meds, allergies and Social Hx reviewed.  Prenatal exam:Gen: Well nourished, well developed.  No distress.  Vitals noted. HEENT: Normocephalic, atraumatic.  Neck supple without cervical lymphadenopathy, thyromegaly or thyroid nodules.  fair dentition. CV: RRR no murmur, gallops or rubs Lungs: CTA B.  Normal respiratory effort without wheezes or rales. Abd: soft, NTND. +BS.  Uterus not appreciated above pelvis. GU: Normal external female genitalia without lesions.  Nl vaginal, well rugated without lesions. No vaginal discharge.  Bimanual exam: No adnexal mass or TTP. No CMT.   Ext: No clubbing, cyanosis or edema. Psych: Normal grooming and dress.  Not depressed or anxious appearing.  Normal thought content and process without flight of ideas or looseness of associations  Assessment/plan: 1) Pregnancy Unknown doing well.  Current pregnancy issues include: nausea. Discussed eating smaller meals more frequently. Provided Vitamin B6 25mg  w8hr PRN.  Dating is reliable, but will obtain US due to history of ectopic pregnancy and verify dates.  History of preterm birth: at 5036 weeks GA. Discussed weekly 17P injections starting at 16 weeks if interested. They would like to think about this.  Prenatal labs reviewed.  STD screening tests completed today.  Bleeding and pain precautions reviewed. Importance of prenatal vitamins reviewed.  Genetic screening offered. Patient is interested. Not quite at the gestational age to get integrated screening Paternal history of sickle cell trait: mother does not have sickle cell trait. Declined genetic counseling Early glucola is indicated. Ordered as a future order.     Follow up 4 weeks.

## 2017-04-10 NOTE — Patient Instructions (Addendum)
Thank you for coming in. Please follow up in 4 weeks for your next OB visit.   We ordered an ultrasound to verify dates.  We also ordered genetic screening which will be done around 11 weeks into your pregnancy  - for your nausea you can take Vitamin B6 as needed.   Tips for nausea: -Don't force yourself to eat.   -Concentrate on clear liquids   (broth, jello, apple or cranberry   juice, water). -Eat small meals frequently    instead of 3 big meals. -Crackers 1st thing in the morning;  keep within easy reach of your   bed.  They work best if eaten   before you lift or move your head  around in the morning.

## 2017-04-11 LAB — CERVICOVAGINAL ANCILLARY ONLY
CHLAMYDIA, DNA PROBE: NEGATIVE
Neisseria Gonorrhea: NEGATIVE
Trichomonas: NEGATIVE

## 2017-04-12 ENCOUNTER — Telehealth: Payer: Self-pay | Admitting: Internal Medicine

## 2017-04-12 DIAGNOSIS — Z3491 Encounter for supervision of normal pregnancy, unspecified, first trimester: Secondary | ICD-10-CM

## 2017-04-12 NOTE — Telephone Encounter (Signed)
Called patient to inform of negative STD testing

## 2017-04-13 ENCOUNTER — Other Ambulatory Visit: Payer: Medicaid Other

## 2017-04-14 ENCOUNTER — Telehealth: Payer: Self-pay | Admitting: Internal Medicine

## 2017-04-14 ENCOUNTER — Ambulatory Visit (HOSPITAL_COMMUNITY)
Admission: RE | Admit: 2017-04-14 | Discharge: 2017-04-14 | Disposition: A | Discharge status: Home / Self Care | Payer: Medicaid Other | Source: Ambulatory Visit | Attending: Family Medicine | Admitting: Family Medicine

## 2017-04-14 DIAGNOSIS — Z3A01 Less than 8 weeks gestation of pregnancy: Secondary | ICD-10-CM | POA: Diagnosis not present

## 2017-04-14 DIAGNOSIS — Z3201 Encounter for pregnancy test, result positive: Secondary | ICD-10-CM

## 2017-04-14 DIAGNOSIS — Z3491 Encounter for supervision of normal pregnancy, unspecified, first trimester: Secondary | ICD-10-CM | POA: Diagnosis present

## 2017-04-14 NOTE — Telephone Encounter (Signed)
Call patient report about the dating ultrasound.  Looks like her LMP dates are equivocal to ultrasound dates.  There is a small subchorionic hemorrhage noted on the ultrasound.  We discussed about this.  Answered questions

## 2017-04-17 ENCOUNTER — Other Ambulatory Visit: Payer: Medicaid Other

## 2017-05-05 DIAGNOSIS — Z3490 Encounter for supervision of normal pregnancy, unspecified, unspecified trimester: Secondary | ICD-10-CM | POA: Insufficient documentation

## 2017-05-08 ENCOUNTER — Encounter: Payer: Self-pay | Admitting: Obstetrics

## 2017-05-08 ENCOUNTER — Other Ambulatory Visit (HOSPITAL_COMMUNITY)
Admission: RE | Admit: 2017-05-08 | Discharge: 2017-05-08 | Disposition: A | Discharge status: Home / Self Care | Payer: Medicaid Other | Source: Ambulatory Visit | Attending: Obstetrics | Admitting: Obstetrics

## 2017-05-08 ENCOUNTER — Other Ambulatory Visit: Payer: Self-pay

## 2017-05-08 ENCOUNTER — Ambulatory Visit (INDEPENDENT_AMBULATORY_CARE_PROVIDER_SITE_OTHER): Payer: Medicaid Other | Admitting: Obstetrics

## 2017-05-08 VITALS — BP 113/69 | HR 82 | Temp 98.1°F | Wt 139.9 lb

## 2017-05-08 DIAGNOSIS — Z3481 Encounter for supervision of other normal pregnancy, first trimester: Secondary | ICD-10-CM | POA: Diagnosis not present

## 2017-05-08 DIAGNOSIS — Z3490 Encounter for supervision of normal pregnancy, unspecified, unspecified trimester: Secondary | ICD-10-CM | POA: Diagnosis not present

## 2017-05-08 DIAGNOSIS — Z3A00 Weeks of gestation of pregnancy not specified: Secondary | ICD-10-CM | POA: Diagnosis not present

## 2017-05-08 DIAGNOSIS — Z3491 Encounter for supervision of normal pregnancy, unspecified, first trimester: Secondary | ICD-10-CM

## 2017-05-08 MED ORDER — VITAFOL ULTRA 29-0.6-0.4-200 MG PO CAPS: 1.0000 | ORAL_CAPSULE | Freq: Every day | ORAL | 4 refills | Status: DC

## 2017-05-08 NOTE — Progress Notes (Signed)
Subjective:    Brittney NajjarQuonique Tysha Rohm is being seen today for her first obstetrical visit.  This is not a planned pregnancy. She is at 7041w5d gestation. Her obstetrical history is significant for none. Relationship with FOB: significant other, living together. Patient does intend to breast feed. Pregnancy history fully reviewed.  The information documented in the HPI was reviewed and verified.  Menstrual History: OB History    Gravida  5   Para  2   Term  1   Preterm  1   AB  2   Living  2     SAB  1   TAB      Ectopic  1   Multiple  0   Live Births  2            Patient's last menstrual period was 02/22/2017 (exact date).    Past Medical History:  Diagnosis Date  . Headache   . Preterm labor     Past Surgical History:  Procedure Laterality Date  . NO PAST SURGERIES       (Not in a hospital admission) No Known Allergies  Social History   Tobacco Use  . Smoking status: Never Smoker  . Smokeless tobacco: Never Used  Substance Use Topics  . Alcohol use: No    Alcohol/week: 0.0 oz    Family History  Problem Relation Age of Onset  . Diabetes Other   . Hypertension Mother   . Hypertension Father   . Epilepsy Sister   . Epilepsy Brother   . Diabetes Maternal Aunt      Review of Systems Constitutional: negative for weight loss Gastrointestinal: negative for vomiting Genitourinary:negative for genital lesions and vaginal discharge and dysuria Musculoskeletal:negative for back pain Behavioral/Psych: negative for abusive relationship, depression, illegal drug usage and tobacco use    Objective:    BP 113/69   Pulse 82   Temp 98.1 F (36.7 C)   Wt 139 lb 14.4 oz (63.5 kg)   LMP 02/22/2017 (Exact Date)   BMI 24.01 kg/m  General Appearance:    Alert, cooperative, no distress, appears stated age  Head:    Normocephalic, without obvious abnormality, atraumatic  Eyes:    PERRL, conjunctiva/corneas clear, EOM's intact, fundi    benign, both eyes   Ears:    Normal TM's and external ear canals, both ears  Nose:   Nares normal, septum midline, mucosa normal, no drainage    or sinus tenderness  Throat:   Lips, mucosa, and tongue normal; teeth and gums normal  Neck:   Supple, symmetrical, trachea midline, no adenopathy;    thyroid:  no enlargement/tenderness/nodules; no carotid   bruit or JVD  Back:     Symmetric, no curvature, ROM normal, no CVA tenderness  Lungs:     Clear to auscultation bilaterally, respirations unlabored  Chest Wall:    No tenderness or deformity   Heart:    Regular rate and rhythm, S1 and S2 normal, no murmur, rub   or gallop  Breast Exam:    No tenderness, masses, or nipple abnormality  Abdomen:     Soft, non-tender, bowel sounds active all four quadrants,    no masses, no organomegaly  Genitalia:    Normal female without lesion, discharge or tenderness  Extremities:   Extremities normal, atraumatic, no cyanosis or edema  Pulses:   2+ and symmetric all extremities  Skin:   Skin color, texture, turgor normal, no rashes or lesions  Lymph nodes:  Cervical, supraclavicular, and axillary nodes normal  Neurologic:   CNII-XII intact, normal strength, sensation and reflexes    throughout      Lab Review Urine pregnancy test Labs reviewed yes Radiologic studies reviewed no   Assessment:     1. Encounter for supervision of normal pregnancy, antepartum, unspecified gravidity Rx: - Enroll Patient in Babyscripts - Babyscripts Schedule Optimization - Hemoglobinopathy evaluation - Cystic Fibrosis Mutation 97 - Genetic Screening - Cytology - PAP - Prenat-Fe Poly-Methfol-FA-DHA (VITAFOL ULTRA) 29-0.6-0.4-200 MG CAPS; Take 1 capsule by mouth daily before breakfast.  Dispense: 90 capsule; Refill: 4   Plan:      Prenatal vitamins.  Counseling provided regarding continued use of seat belts, cessation of alcohol consumption, smoking or use of illicit drugs; infection precautions i.e., influenza/TDAP immunizations,  toxoplasmosis,CMV, parvovirus, listeria and varicella; workplace safety, exercise during pregnancy; routine dental care, safe medications, sexual activity, hot tubs, saunas, pools, travel, caffeine use, fish and methlymercury, potential toxins, hair treatments, varicose veins Weight gain recommendations per IOM guidelines reviewed: underweight/BMI< 18.5--> gain 28 - 40 lbs; normal weight/BMI 18.5 - 24.9--> gain 25 - 35 lbs; overweight/BMI 25 - 29.9--> gain 15 - 25 lbs; obese/BMI >30->gain  11 - 20 lbs Problem list reviewed and updated. FIRST/CF mutation testing/NIPT/QUAD SCREEN/fragile X/Ashkenazi Jewish population testing/Spinal muscular atrophy discussed: requested. Role of ultrasound in pregnancy discussed; fetal survey: requested. Amniocentesis discussed: not indicated.  Meds ordered this encounter  Medications  . Prenat-Fe Poly-Methfol-FA-DHA (VITAFOL ULTRA) 29-0.6-0.4-200 MG CAPS    Sig: Take 1 capsule by mouth daily before breakfast.    Dispense:  90 capsule    Refill:  4   Orders Placed This Encounter  Procedures  . Hemoglobinopathy evaluation  . Cystic Fibrosis Mutation 97  . Genetic Screening    Follow up in 4 weeks. 50% of 20 min visit spent on counseling and coordination of care.     Brock Bad MD 05-08-2017

## 2017-05-10 ENCOUNTER — Other Ambulatory Visit: Payer: Self-pay | Admitting: Obstetrics

## 2017-05-10 DIAGNOSIS — B373 Candidiasis of vulva and vagina: Secondary | ICD-10-CM

## 2017-05-10 DIAGNOSIS — B3731 Acute candidiasis of vulva and vagina: Secondary | ICD-10-CM

## 2017-05-10 LAB — CYTOLOGY - PAP: DIAGNOSIS: NEGATIVE

## 2017-05-10 MED ORDER — TERCONAZOLE 0.4 % VA CREA: 1.0000 | TOPICAL_CREAM | Freq: Every day | VAGINAL | 0 refills | Status: DC

## 2017-05-11 ENCOUNTER — Encounter: Payer: Medicaid Other | Admitting: Internal Medicine

## 2017-05-15 ENCOUNTER — Encounter: Payer: Self-pay | Admitting: Obstetrics

## 2017-05-16 LAB — HEMOGLOBINOPATHY EVALUATION
HEMOGLOBIN A2 QUANTITATION: 2.7 % (ref 1.8–3.2)
HEMOGLOBIN F QUANTITATION: 0 % (ref 0.0–2.0)
HGB A: 97.3 % (ref 96.4–98.8)
HGB C: 0 %
HGB S: 0 %
HGB VARIANT: 0 %

## 2017-05-16 LAB — CYSTIC FIBROSIS MUTATION 97: Interpretation: NOT DETECTED

## 2017-06-05 ENCOUNTER — Encounter: Payer: Self-pay | Admitting: Obstetrics

## 2017-06-05 ENCOUNTER — Ambulatory Visit (INDEPENDENT_AMBULATORY_CARE_PROVIDER_SITE_OTHER): Payer: Medicaid Other | Admitting: Obstetrics

## 2017-06-05 VITALS — BP 116/76 | HR 103 | Temp 98.2°F | Wt 145.5 lb

## 2017-06-05 DIAGNOSIS — Z3490 Encounter for supervision of normal pregnancy, unspecified, unspecified trimester: Secondary | ICD-10-CM

## 2017-06-05 DIAGNOSIS — J301 Allergic rhinitis due to pollen: Secondary | ICD-10-CM

## 2017-06-05 MED ORDER — LORATADINE 10 MG PO TABS: 10.0000 mg | ORAL_TABLET | Freq: Every day | ORAL | 11 refills | Status: DC

## 2017-06-05 NOTE — Progress Notes (Signed)
Subjective:  Brittney [name] is a 27 y.o. [id number] at [redacted]w[redacted]d being seen today for ongoing prenatal care.  She is currently monitored for the following issues for this low-risk pregnancy and has Eczema; Vaginal discharge; First trimester pregnancy; and Supervision of normal pregnancy, antepartum on their problem list.  Patient reports no complaints.  Contractions: Not present. Vag. Bleeding: None.  Movement: Present. Denies leaking of fluid.   The following portions of the patient's history were reviewed and updated as appropriate: allergies, current medications, past family history, past medical history, past social history, past surgical history and problem list. Problem list updated.  Objective:   Vitals:   06/05/17 1042  BP: 116/76  Pulse: (!) 103  Temp: 98.2 F (36.8 C)  Weight: 145 lb 8 oz (66 kg)    Fetal Status: Fetal Heart Rate (bpm): 150   Movement: Present     General:  Alert, oriented and cooperative. Patient is in no acute distress.  Skin: Skin is warm and dry. No rash noted.   Cardiovascular: Normal heart rate noted  Respiratory: Normal respiratory effort, no problems with respiration noted  Abdomen: Soft, gravid, appropriate for gestational age. Pain/Pressure: Present     Pelvic:  Cervical exam deferred        Extremities: Normal range of motion.  Edema: None  Mental Status: Normal mood and affect. Normal behavior. Normal judgment and thought content.   Urinalysis:      Assessment and Plan:  Pregnancy: [id number] at [redacted]w[redacted]d  1. Encounter for supervision of normal pregnancy, antepartum, unspecified gravidity Rx: - [place] MFM OB COMP + 14 WK; Future  2. Seasonal allergic rhinitis due to pollen Rx: - loratadine (CLARITIN) 10 MG tablet; Take 1 tablet (10 mg total) by mouth daily.  Dispense: 30 tablet; Refill: 11  Preterm labor symptoms and general obstetric precautions including but not limited to vaginal bleeding, contractions, leaking of fluid and fetal movement were  reviewed in detail with the patient. Please refer to After Visit Summary for other counseling recommendations.  Return in about 1 month (around 07/03/2017) for ROB.   Brock Bad, MD

## 2017-06-06 NOTE — Addendum Note (Signed)
Addended by: Pennie Banter on: 06/06/2017 09:21 AM   Modules accepted: Orders

## 2017-07-04 ENCOUNTER — Other Ambulatory Visit: Payer: Self-pay | Admitting: Obstetrics

## 2017-07-04 ENCOUNTER — Ambulatory Visit (HOSPITAL_COMMUNITY)
Admission: RE | Admit: 2017-07-04 | Discharge: 2017-07-04 | Disposition: A | Discharge status: Home / Self Care | Payer: Medicaid Other | Source: Ambulatory Visit | Attending: Obstetrics | Admitting: Obstetrics

## 2017-07-04 ENCOUNTER — Encounter: Payer: Self-pay | Admitting: Obstetrics

## 2017-07-04 ENCOUNTER — Ambulatory Visit (INDEPENDENT_AMBULATORY_CARE_PROVIDER_SITE_OTHER): Payer: Medicaid Other | Admitting: Obstetrics

## 2017-07-04 VITALS — BP 121/76 | HR 93 | Wt 153.4 lb

## 2017-07-04 DIAGNOSIS — Z3A18 18 weeks gestation of pregnancy: Secondary | ICD-10-CM | POA: Insufficient documentation

## 2017-07-04 DIAGNOSIS — Z3490 Encounter for supervision of normal pregnancy, unspecified, unspecified trimester: Secondary | ICD-10-CM

## 2017-07-04 DIAGNOSIS — O09212 Supervision of pregnancy with history of pre-term labor, second trimester: Secondary | ICD-10-CM

## 2017-07-04 DIAGNOSIS — Z363 Encounter for antenatal screening for malformations: Secondary | ICD-10-CM | POA: Insufficient documentation

## 2017-07-04 DIAGNOSIS — Z3482 Encounter for supervision of other normal pregnancy, second trimester: Secondary | ICD-10-CM

## 2017-07-04 NOTE — Progress Notes (Signed)
Subjective:  Brittney [name] is a 27 y.o. [id number] at [redacted]w[redacted]d being seen today for ongoing prenatal care.  She is currently monitored for the following issues for this low-risk pregnancy and has Eczema; Vaginal discharge; First trimester pregnancy; and Supervision of normal pregnancy, antepartum on their problem list.  Patient reports no complaints.  Contractions: Not present. Vag. Bleeding: None.  Movement: Present. Denies leaking of fluid.   The following portions of the patient's history were reviewed and updated as appropriate: allergies, current medications, past family history, past medical history, past social history, past surgical history and problem list. Problem list updated.  Objective:   Vitals:   07/04/17 1012  BP: 121/76  Pulse: 93  Weight: 153 lb 6.4 oz (69.6 kg)    Fetal Status: Fetal Heart Rate (bpm): 150   Movement: Present     General:  Alert, oriented and cooperative. Patient is in no acute distress.  Skin: Skin is warm and dry. No rash noted.   Cardiovascular: Normal heart rate noted  Respiratory: Normal respiratory effort, no problems with respiration noted  Abdomen: Soft, gravid, appropriate for gestational age. Pain/Pressure: Present     Pelvic:  Cervical exam deferred        Extremities: Normal range of motion.  Edema: None  Mental Status: Normal mood and affect. Normal behavior. Normal judgment and thought content.   Urinalysis:      Assessment and Plan:  Pregnancy: [id number] at [redacted]w[redacted]d  1. Encounter for supervision of normal pregnancy, antepartum, unspecified gravidity  Preterm labor symptoms and general obstetric precautions including but not limited to vaginal bleeding, contractions, leaking of fluid and fetal movement were reviewed in detail with the patient. Please refer to After Visit Summary for other counseling recommendations.  Return in about 1 month (around 08/01/2017) for ROB.   Brock Bad, MD

## 2017-08-01 ENCOUNTER — Other Ambulatory Visit: Payer: Self-pay

## 2017-08-01 ENCOUNTER — Ambulatory Visit (INDEPENDENT_AMBULATORY_CARE_PROVIDER_SITE_OTHER): Payer: Medicaid Other | Admitting: Obstetrics

## 2017-08-01 ENCOUNTER — Encounter: Payer: Self-pay | Admitting: Obstetrics

## 2017-08-01 VITALS — BP 109/60 | HR 95 | Wt 162.9 lb

## 2017-08-01 DIAGNOSIS — Z3490 Encounter for supervision of normal pregnancy, unspecified, unspecified trimester: Secondary | ICD-10-CM

## 2017-08-01 DIAGNOSIS — Z3482 Encounter for supervision of other normal pregnancy, second trimester: Secondary | ICD-10-CM

## 2017-08-01 NOTE — Progress Notes (Signed)
ROB.  Reports no complaints today. 

## 2017-08-01 NOTE — Progress Notes (Signed)
Subjective:  Brittney [name] is a 27 y.o. [phone] at 549w6d being seen today for ongoing prenatal care.  She is currently monitored for the following issues for this low-risk pregnancy and has Eczema; Vaginal discharge; First trimester pregnancy; and Supervision of normal pregnancy, antepartum on their problem list.  Patient reports no complaints.  Contractions: Not present. Vag. Bleeding: None.  Movement: Present. Denies leaking of fluid.   The following portions of the patient's history were reviewed and updated as appropriate: allergies, current medications, past family history, past medical history, past social history, past surgical history and problem list. Problem list updated.  Objective:   Vitals:   08/01/17 1012  BP: 109/60  Pulse: 95  Weight: 162 lb 14.4 oz (73.9 kg)    Fetal Status: Fetal Heart Rate (bpm): 150   Movement: Present     General:  Alert, oriented and cooperative. Patient is in no acute distress.  Skin: Skin is warm and dry. No rash noted.   Cardiovascular: Normal heart rate noted  Respiratory: Normal respiratory effort, no problems with respiration noted  Abdomen: Soft, gravid, appropriate for gestational age. Pain/Pressure: Absent     Pelvic:  Cervical exam deferred        Extremities: Normal range of motion.  Edema: None  Mental Status: Normal mood and affect. Normal behavior. Normal judgment and thought content.   Urinalysis:      Assessment and Plan:  Pregnancy: [id number] at 6749w6d  1. Encounter for supervision of normal pregnancy, antepartum, unspecified gravidity   Preterm labor symptoms and general obstetric precautions including but not limited to vaginal bleeding, contractions, leaking of fluid and fetal movement were reviewed in detail with the patient. Please refer to After Visit Summary for other counseling recommendations.  Return in about 1 month (around 08/29/2017) for ROB.   Brock BadHarper, Debany Vantol A, MD

## 2017-08-29 ENCOUNTER — Ambulatory Visit (INDEPENDENT_AMBULATORY_CARE_PROVIDER_SITE_OTHER): Payer: Medicaid Other | Admitting: Obstetrics

## 2017-08-29 ENCOUNTER — Encounter: Payer: Self-pay | Admitting: Obstetrics

## 2017-08-29 DIAGNOSIS — Z3480 Encounter for supervision of other normal pregnancy, unspecified trimester: Secondary | ICD-10-CM

## 2017-08-29 NOTE — Progress Notes (Signed)
Patient reports good fetal movement with occasional pressure and uterine irritability. 

## 2017-08-29 NOTE — Progress Notes (Signed)
Subjective:  Brittney [name] is a 27 y.o. [phone] at 1466w6d being seen today for ongoing prenatal care.  She is currently monitored for the following issues for this low-risk pregnancy and has Eczema; Vaginal discharge; First trimester pregnancy; and Supervision of normal pregnancy, antepartum on their problem list.  Patient reports occasional contractions.  Contractions: Irritability. Vag. Bleeding: None.  Movement: Present. Denies leaking of fluid.   The following portions of the patient's history were reviewed and updated as appropriate: allergies, current medications, past family history, past medical history, past social history, past surgical history and problem list. Problem list updated.  Objective:   Vitals:   08/29/17 1044  BP: 126/76  Pulse: (!) 104  Weight: 173 lb 11.2 oz (78.8 kg)    Fetal Status: Fetal Heart Rate (bpm): 150   Movement: Present     General:  Alert, oriented and cooperative. Patient is in no acute distress.  Skin: Skin is warm and dry. No rash noted.   Cardiovascular: Normal heart rate noted  Respiratory: Normal respiratory effort, no problems with respiration noted  Abdomen: Soft, gravid, appropriate for gestational age. Pain/Pressure: Present     Pelvic:  Cervical exam deferred        Extremities: Normal range of motion.  Edema: None  Mental Status: Normal mood and affect. Normal behavior. Normal judgment and thought content.   Urinalysis:      Assessment and Plan:  Pregnancy: [id number] at 5866w6d  1. Supervision of other normal pregnancy, antepartum   Preterm labor symptoms and general obstetric precautions including but not limited to vaginal bleeding, contractions, leaking of fluid and fetal movement were reviewed in detail with the patient. Please refer to After Visit Summary for other counseling recommendations.  Return in about 2 weeks (around 09/12/2017) for ROB.   Brock BadHarper, Charles A, MD

## 2017-08-30 ENCOUNTER — Ambulatory Visit (INDEPENDENT_AMBULATORY_CARE_PROVIDER_SITE_OTHER): Payer: Medicaid Other | Admitting: *Deleted

## 2017-08-30 DIAGNOSIS — Z111 Encounter for screening for respiratory tuberculosis: Secondary | ICD-10-CM | POA: Diagnosis not present

## 2017-08-30 NOTE — Progress Notes (Signed)
Patient is here for a PPD placement.  PPD placed in right forearm(tatoo on left arm).  Patient will return 09/01/17 to have PPD read. Sherrel Ploch, Maryjo RochesterJessica Dawn, CMA

## 2017-09-01 ENCOUNTER — Ambulatory Visit: Payer: Medicaid Other

## 2017-09-01 DIAGNOSIS — Z111 Encounter for screening for respiratory tuberculosis: Secondary | ICD-10-CM

## 2017-09-01 LAB — TB SKIN TEST
INDURATION: 0 mm
TB Skin Test: NEGATIVE

## 2017-09-01 NOTE — Progress Notes (Signed)
Patient presents in nurse clinic for PPD site read. PPD read and results entered into epic.  Result: 0 mm induration.  Interpretation: negative  Letter generated and printed for patient to take to her employer.

## 2017-09-11 ENCOUNTER — Encounter: Payer: Medicaid Other | Admitting: Obstetrics and Gynecology

## 2017-09-11 ENCOUNTER — Other Ambulatory Visit: Payer: Medicaid Other

## 2017-09-20 ENCOUNTER — Ambulatory Visit (INDEPENDENT_AMBULATORY_CARE_PROVIDER_SITE_OTHER): Payer: Medicaid Other | Admitting: Obstetrics and Gynecology

## 2017-09-20 ENCOUNTER — Other Ambulatory Visit: Payer: Medicaid Other

## 2017-09-20 VITALS — BP 121/77 | HR 99 | Wt 176.0 lb

## 2017-09-20 DIAGNOSIS — Z3480 Encounter for supervision of other normal pregnancy, unspecified trimester: Secondary | ICD-10-CM | POA: Diagnosis not present

## 2017-09-20 DIAGNOSIS — Z3483 Encounter for supervision of other normal pregnancy, third trimester: Secondary | ICD-10-CM

## 2017-09-20 NOTE — Progress Notes (Signed)
Prenatal Visit Note Date: 09/20/2017 Clinic: [facility]  Subjective:  Brittney [name] is a 27 y.o. [id number] at 185w0d being seen today for ongoing prenatal care.  She is currently monitored for the following issues for this low-risk pregnancy and has Eczema; Vaginal discharge; and Supervision of normal pregnancy, antepartum on their problem list.  Patient reports normal pregnancy aches.   Contractions: Irritability. Vag. Bleeding: None.  Movement: Present. Denies leaking of fluid.   The following portions of the patient's history were reviewed and updated as appropriate: allergies, current medications, past family history, past medical history, past social history, past surgical history and problem list. Problem list updated.  Objective:   Vitals:   09/20/17 0951  BP: 121/77  Pulse: 99  Weight: 176 lb (79.8 kg)    Fetal Status: Fetal Heart Rate (bpm): 140s Fundal Height: 30 cm Movement: Present  Presentation: Vertex  General:  Alert, oriented and cooperative. Patient is in no acute distress.  Skin: Skin is warm and dry. No rash noted.   Cardiovascular: Normal heart rate noted  Respiratory: Normal respiratory effort, no problems with respiration noted  Abdomen: Soft, gravid, appropriate for gestational age. Pain/Pressure: Present     Pelvic:  Cervical exam deferred        Extremities: Normal range of motion.     Mental Status: Normal mood and affect. Normal behavior. Normal judgment and thought content.   Urinalysis:      Assessment and Plan:  Pregnancy: [id number] at 875w0d  1. Supervision of other normal pregnancy, antepartum Counseling given. Will do 1hr since not fasting. Undecided on BC - Glucose tolerance, 1 hour - CBC - RPR - HIV antibody (with reflex)  Preterm labor symptoms and general obstetric precautions including but not limited to vaginal bleeding, contractions, leaking of fluid and fetal movement were reviewed in detail with the patient. Please refer to After  Visit Summary for other counseling recommendations.  Return in about 2 weeks (around 10/04/2017) for rob.   Rives BingPickens, Londen Lorge, MD

## 2017-09-21 ENCOUNTER — Encounter: Payer: Self-pay | Admitting: Obstetrics and Gynecology

## 2017-09-21 DIAGNOSIS — O99810 Abnormal glucose complicating pregnancy: Secondary | ICD-10-CM | POA: Insufficient documentation

## 2017-09-21 LAB — CBC
HEMATOCRIT: 31.7 % — AB (ref 34.0–46.6)
HEMOGLOBIN: 10.2 g/dL — AB (ref 11.1–15.9)
MCH: 27.6 pg (ref 26.6–33.0)
MCHC: 32.2 g/dL (ref 31.5–35.7)
MCV: 86 fL (ref 79–97)
Platelets: 316 10*3/uL (ref 150–450)
RBC: 3.69 x10E6/uL — ABNORMAL LOW (ref 3.77–5.28)
RDW: 13.7 % (ref 12.3–15.4)
WBC: 11.3 10*3/uL — ABNORMAL HIGH (ref 3.4–10.8)

## 2017-09-21 LAB — GLUCOSE TOLERANCE, 1 HOUR: GLUCOSE, 1HR PP: 168 mg/dL (ref 65–199)

## 2017-09-21 LAB — RPR: RPR Ser Ql: NONREACTIVE

## 2017-09-21 LAB — HIV ANTIBODY (ROUTINE TESTING W REFLEX): HIV SCREEN 4TH GENERATION: NONREACTIVE

## 2017-09-25 ENCOUNTER — Other Ambulatory Visit: Payer: Medicaid Other

## 2017-09-25 DIAGNOSIS — Z3480 Encounter for supervision of other normal pregnancy, unspecified trimester: Secondary | ICD-10-CM | POA: Diagnosis not present

## 2017-09-26 ENCOUNTER — Other Ambulatory Visit: Payer: Self-pay | Admitting: Obstetrics

## 2017-09-26 DIAGNOSIS — O24410 Gestational diabetes mellitus in pregnancy, diet controlled: Secondary | ICD-10-CM

## 2017-09-26 DIAGNOSIS — O99019 Anemia complicating pregnancy, unspecified trimester: Secondary | ICD-10-CM

## 2017-09-26 LAB — CBC
Hematocrit: 31.4 % — ABNORMAL LOW (ref 34.0–46.6)
Hemoglobin: 10 g/dL — ABNORMAL LOW (ref 11.1–15.9)
MCH: 27.3 pg (ref 26.6–33.0)
MCHC: 31.8 g/dL (ref 31.5–35.7)
MCV: 86 fL (ref 79–97)
PLATELETS: 334 10*3/uL (ref 150–450)
RBC: 3.66 x10E6/uL — AB (ref 3.77–5.28)
RDW: 12.8 % (ref 12.3–15.4)
WBC: 12.1 10*3/uL — ABNORMAL HIGH (ref 3.4–10.8)

## 2017-09-26 LAB — RPR: RPR Ser Ql: NONREACTIVE

## 2017-09-26 LAB — GLUCOSE TOLERANCE, 2 HOURS W/ 1HR
GLUCOSE, 1 HOUR: 183 mg/dL — AB (ref 65–179)
GLUCOSE, FASTING: 87 mg/dL (ref 65–91)
Glucose, 2 hour: 183 mg/dL — ABNORMAL HIGH (ref 65–152)

## 2017-09-26 LAB — HIV ANTIBODY (ROUTINE TESTING W REFLEX): HIV SCREEN 4TH GENERATION: NONREACTIVE

## 2017-09-26 MED ORDER — FERROUS SULFATE 325 (65 FE) MG PO TABS: 325.0000 mg | ORAL_TABLET | Freq: Two times a day (BID) | ORAL | 5 refills | Status: DC

## 2017-10-04 ENCOUNTER — Encounter: Payer: Medicaid Other | Admitting: Obstetrics and Gynecology

## 2017-10-23 ENCOUNTER — Encounter: Payer: Self-pay | Admitting: Obstetrics and Gynecology

## 2017-10-23 ENCOUNTER — Ambulatory Visit (INDEPENDENT_AMBULATORY_CARE_PROVIDER_SITE_OTHER): Payer: Medicaid Other | Admitting: Obstetrics and Gynecology

## 2017-10-23 VITALS — BP 124/81 | HR 97 | Wt 183.0 lb

## 2017-10-23 DIAGNOSIS — O09213 Supervision of pregnancy with history of pre-term labor, third trimester: Secondary | ICD-10-CM

## 2017-10-23 DIAGNOSIS — O24419 Gestational diabetes mellitus in pregnancy, unspecified control: Secondary | ICD-10-CM

## 2017-10-23 DIAGNOSIS — O09219 Supervision of pregnancy with history of pre-term labor, unspecified trimester: Secondary | ICD-10-CM

## 2017-10-23 DIAGNOSIS — Z3480 Encounter for supervision of other normal pregnancy, unspecified trimester: Secondary | ICD-10-CM

## 2017-10-23 DIAGNOSIS — O99810 Abnormal glucose complicating pregnancy: Secondary | ICD-10-CM

## 2017-10-23 DIAGNOSIS — Z3483 Encounter for supervision of other normal pregnancy, third trimester: Secondary | ICD-10-CM

## 2017-10-23 LAB — GLUCOSE, POCT (MANUAL RESULT ENTRY): POC Glucose: 95 mg/dl (ref 70–99)

## 2017-10-23 MED ORDER — ACCU-CHEK FASTCLIX LANCETS MISC: 1.0000 [IU] | Freq: Four times a day (QID) | 12 refills | Status: DC

## 2017-10-23 MED ORDER — GLUCOSE BLOOD VI STRP: ORAL_STRIP | 12 refills | Status: DC

## 2017-10-23 MED ORDER — ACCU-CHEK NANO SMARTVIEW W/DEVICE KIT: 1.0000 | PACK | 0 refills | Status: DC

## 2017-10-23 NOTE — Patient Instructions (Signed)
Gestational Diabetes Mellitus, Diagnosis  Gestational diabetes (gestational diabetes mellitus) is a temporary form of diabetes that some women develop during pregnancy. It usually occurs around weeks 24-28 of pregnancy and goes away after delivery. Hormonal changes during pregnancy can interfere with insulin production and function, which may result in one or both of these problems:   The pancreas does not make enough of a hormone called insulin.   Cells in the body do not respond properly to insulin that the body makes (insulin resistance).    Normally, insulin allows sugars (glucose) to enter cells in the body. The cells use glucose for energy. Insulin resistance or lack of insulin causes excess glucose to build up in the blood instead of going into cells. As a result, high blood glucose (hyperglycemia) develops.  What are the risks?  If gestational diabetes is treated, it is unlikely to cause problems. If it is not controlled with treatment, it may cause problems during labor and delivery, and some of those problems can be harmful to the unborn baby (fetus) and the mother. Uncontrolled gestational diabetes may also cause the newborn baby to have breathing problems and low blood glucose.  Women who get gestational diabetes are more likely to develop it if they get pregnant again, and they are more likely to develop type 2 diabetes in the future.  What increases the risk?  This condition may be more likely to develop in pregnant women who:   Are older than age 25 during pregnancy.   Have a family history of diabetes.   Are overweight.   Had gestational diabetes in the past.   Have polycystic ovarian syndrome (PCOS).   Are pregnant with twins or multiples.   Are of American-Indian, African-American, Hispanic/Latino, or Asian/Pacific Islander descent.    What are the signs or symptoms?  Most women do not notice symptoms of gestational diabetes because the symptoms are similar to normal symptoms of pregnancy.  Symptoms of gestational diabetes may include:   Increased thirst (polydipsia).   Increased hunger(polyphagia).   Increased urination (polyuria).    How is this diagnosed?    This condition may be diagnosed based on your blood glucose level, which may be checked with one or more of the following blood tests:   A fasting blood glucose (FBG) test. You will not be allowed to eat (you will fast) for at least 8 hours before a blood sample is taken.   A random blood glucose test. This checks your blood glucose at any time of day regardless of when you ate.   An oral glucose tolerance test (OGTT). This is usually done during weeks 24-28 of pregnancy.  ? For this test, you will have an FBG test done. Then, you will drink a beverage that contains glucose. Your blood glucose will be tested again 1 hour after drinking the glucose beverage (1-hour OGTT).  ? If the 1-hour OGTT result is at or above 140 mg/dL (7.8 mmol/L), you will repeat the OGTT. This time, your blood glucose will be tested 3 hours after drinking the glucose beverage (3-hour OGTT).    If you have risk factors, you may be screened for undiagnosed type 2 diabetes at your first health care visit during your pregnancy (prenatal visit).  How is this treated?    Your treatment may be managed by a specialist called an endocrinologist. This condition is treated by following instructions from your health care provider about:   Eating a healthier diet and getting more physical activity.   These changes are the most important ways to manage gestational diabetes.   Checking your blood glucose. Do this as often as told.   Taking diabetes medicines or insulin every day. These will only be prescribed if they are needed.  ? If you use insulin, you may need to adjust your dosage based on how physically active you are and what foods you eat. Your health care provider will tell you how to do this.    Your health care provider will set treatment goals for you based on the  stage of your pregnancy and any other medical conditions you have. Generally, the goal of treatment is to maintain the following blood glucose levels during pregnancy:   Fasting: at or below 95 mg/dL (5.3 mmol/L).   After meals (postprandial):  ? One hour after a meal: at or below 140 mg/dL (7.8 mmol/L).  ? Two hours after a meal: at or below 120 mg/dL (6.7 mmol/L).   A1c (hemoglobin A1c) level: 6-6.5%.    Follow these instructions at home:   Take over-the-counter and prescription medicines only as told by your health care provider.   Manage your weight gain during pregnancy. The amount of weight that you are expected to gain depends on your pre-pregnancy BMI (body mass index).   Keep all follow-up visits as told by your health care provider. This is important.  Consider asking your health care provider these questions:     Do I need to meet with a diabetes educator?   Where can I find a support group for people with diabetes?   What equipment will I need to manage my diabetes at home?   What diabetes medicines do I need, and when should I take them?   How often do I need to check my blood glucose?   What number can I call if I have questions?   When is my next appointment?  Where to find more information:   For more information about diabetes, visit:  ? American Diabetes Association (ADA): www.diabetes.org  ? American Association of Diabetes Educators (AADE): www.diabeteseducator.org/patient-resources  Contact a health care provider if:   Your blood glucose level is at or above 240 mg/dL (13.3 mmol/L).   Your blood glucose level is at or above 200 mg/dL (11.1 mmol/L) and you have ketones in your urine.   You have been sick or have had a fever for 2 days or more and you are not getting better.   You have any of the following problems for more than 6 hours:  ? You cannot eat or drink.  ? You have nausea and vomiting.  ? You have diarrhea.  Get help right away if:   Your blood glucose is below 54  mg/dL (3 mmol/L).   You become confused or you have trouble thinking clearly.   You have difficulty breathing.   You have moderate or large ketone levels in your urine.   Your baby is moving around less than usual.   You develop unusual discharge or bleeding from your vagina.   You start having contractions early (prematurely). Contractions may feel like a tightening in your lower abdomen.  This information is not intended to replace advice given to you by your health care provider. Make sure you discuss any questions you have with your health care provider.  Document Released: 05/02/2000 Document Revised: 07/02/2015 Document Reviewed: 02/27/2015  [employer]vier Interactive Patient Education  2018 [employer]

## 2017-10-23 NOTE — Progress Notes (Signed)
   PRENATAL VISIT NOTE  Subjective:  Brittney [name] is a 27 y.o. [id number] at 6245w5d being seen today for ongoing prenatal care.  She is currently monitored for the following issues for this high-risk pregnancy and has Eczema; Vaginal discharge; Supervision of normal pregnancy, antepartum; Abnormal glucose affecting pregnancy; Previous preterm delivery, antepartum; and Gestational diabetes mellitus (GDM) affecting pregnancy, antepartum on their problem list.  Patient reports no complaints.  Contractions: Not present. Vag. Bleeding: None.  Movement: Present. Denies leaking of fluid.   The following portions of the patient's history were reviewed and updated as appropriate: allergies, current medications, past family history, past medical history, past social history, past surgical history and problem list. Problem list updated.  Objective:   Vitals:   10/23/17 1018  BP: 124/81  Pulse: 97  Weight: 183 lb (83 kg)    Fetal Status: Fetal Heart Rate (bpm): 155 Fundal Height: 35 cm Movement: Present     General:  Alert, oriented and cooperative. Patient is in no acute distress.  Skin: Skin is warm and dry. No rash noted.   Cardiovascular: Normal heart rate noted  Respiratory: Normal respiratory effort, no problems with respiration noted  Abdomen: Soft, gravid, appropriate for gestational age.  Pain/Pressure: Present     Pelvic: Cervical exam deferred        Extremities: Normal range of motion.     Mental Status: Normal mood and affect. Normal behavior. Normal judgment and thought content.   Assessment and Plan:  Pregnancy: [id number] at 1545w5d  1. Previous preterm delivery, antepartum   2. Abnormal glucose affecting pregnancy Patient unaware of GDM diagnosis Referral and testing supplies provided CBG 2 hour post breakfast 95  3. Supervision of other normal pregnancy, antepartum Patient is doing well Cultures next visit - POCT Glucose (CBG)  4. Gestational diabetes mellitus (GDM)  affecting pregnancy, antepartum  - Referral to Nutrition and Diabetes Services - POCT Glucose (CBG)  Preterm labor symptoms and general obstetric precautions including but not limited to vaginal bleeding, contractions, leaking of fluid and fetal movement were reviewed in detail with the patient. Please refer to After Visit Summary for other counseling recommendations.  No follow-ups on file.  No future appointments.  Catalina AntiguaPeggy Bethania Schlotzhauer, MD

## 2017-10-29 ENCOUNTER — Inpatient Hospital Stay (HOSPITAL_COMMUNITY)
Admission: AD | Admit: 2017-10-29 | Discharge: 2017-10-31 | DRG: 807 | Disposition: A | Discharge status: Home / Self Care | Payer: Medicaid Other | Attending: Obstetrics and Gynecology | Admitting: Obstetrics and Gynecology

## 2017-10-29 ENCOUNTER — Encounter (HOSPITAL_COMMUNITY): Payer: Self-pay | Admitting: *Deleted

## 2017-10-29 DIAGNOSIS — O24420 Gestational diabetes mellitus in childbirth, diet controlled: Secondary | ICD-10-CM | POA: Diagnosis present

## 2017-10-29 DIAGNOSIS — O4290 Premature rupture of membranes, unspecified as to length of time between rupture and onset of labor, unspecified weeks of gestation: Secondary | ICD-10-CM | POA: Diagnosis present

## 2017-10-29 DIAGNOSIS — O24419 Gestational diabetes mellitus in pregnancy, unspecified control: Secondary | ICD-10-CM | POA: Diagnosis present

## 2017-10-29 DIAGNOSIS — Z3A35 35 weeks gestation of pregnancy: Secondary | ICD-10-CM | POA: Diagnosis not present

## 2017-10-29 DIAGNOSIS — O42913 Preterm premature rupture of membranes, unspecified as to length of time between rupture and onset of labor, third trimester: Secondary | ICD-10-CM | POA: Diagnosis not present

## 2017-10-29 DIAGNOSIS — O99810 Abnormal glucose complicating pregnancy: Secondary | ICD-10-CM | POA: Diagnosis present

## 2017-10-29 DIAGNOSIS — O09219 Supervision of pregnancy with history of pre-term labor, unspecified trimester: Secondary | ICD-10-CM

## 2017-10-29 DIAGNOSIS — O42013 Preterm premature rupture of membranes, onset of labor within 24 hours of rupture, third trimester: Secondary | ICD-10-CM

## 2017-10-29 DIAGNOSIS — Z3490 Encounter for supervision of normal pregnancy, unspecified, unspecified trimester: Secondary | ICD-10-CM

## 2017-10-29 DIAGNOSIS — Z3480 Encounter for supervision of other normal pregnancy, unspecified trimester: Secondary | ICD-10-CM

## 2017-10-29 LAB — URINALYSIS, ROUTINE W REFLEX MICROSCOPIC
BILIRUBIN URINE: NEGATIVE
GLUCOSE, UA: NEGATIVE mg/dL
KETONES UR: 15 mg/dL — AB
Leukocytes, UA: NEGATIVE
Nitrite: NEGATIVE
PROTEIN: 30 mg/dL — AB
Specific Gravity, Urine: >1.03 — ABNORMAL HIGH (ref 1.005–1.030)
pH: 6 (ref 5.0–8.0)

## 2017-10-29 LAB — TYPE AND SCREEN
ABO/RH(D): O POS
ANTIBODY SCREEN: NEGATIVE

## 2017-10-29 LAB — CBC
HCT: 35.8 % — ABNORMAL LOW (ref 36.0–46.0)
HEMOGLOBIN: 11.6 g/dL — AB (ref 12.0–15.0)
MCH: 27.2 pg (ref 26.0–34.0)
MCHC: 32.4 g/dL (ref 30.0–36.0)
MCV: 84 fL (ref 78.0–100.0)
Platelets: 320 10*3/uL (ref 150–400)
RBC: 4.26 MIL/uL (ref 3.87–5.11)
RDW: 16 % — ABNORMAL HIGH (ref 11.5–15.5)
WBC: 11.3 10*3/uL — ABNORMAL HIGH (ref 4.0–10.5)

## 2017-10-29 LAB — ABO/RH: ABO/RH(D): O POS

## 2017-10-29 LAB — GLUCOSE, CAPILLARY: GLUCOSE-CAPILLARY: 71 mg/dL (ref 70–99)

## 2017-10-29 LAB — URINALYSIS, MICROSCOPIC (REFLEX)

## 2017-10-29 LAB — GROUP B STREP BY PCR: Group B strep by PCR: NEGATIVE

## 2017-10-29 MED ORDER — LACTATED RINGERS IV SOLN
INTRAVENOUS | Status: DC
  Administered 2017-10-29: 16:00:00 via INTRAVENOUS

## 2017-10-29 MED ORDER — TERBUTALINE SULFATE 1 MG/ML IJ SOLN
0.2500 mg | Freq: Once | INTRAMUSCULAR | Status: DC | PRN
  Filled 2017-10-29: qty 1

## 2017-10-29 MED ORDER — PENICILLIN G 3 MILLION UNITS IVPB - SIMPLE MED
3.0000 10*6.[IU] | INTRAVENOUS | Status: DC
  Filled 2017-10-29: qty 100

## 2017-10-29 MED ORDER — IBUPROFEN 600 MG PO TABS
600.0000 mg | ORAL_TABLET | Freq: Four times a day (QID) | ORAL | Status: DC
  Administered 2017-10-29 – 2017-10-31 (×7): 600 mg via ORAL
  Filled 2017-10-29 (×7): qty 1

## 2017-10-29 MED ORDER — OXYCODONE-ACETAMINOPHEN 5-325 MG PO TABS: 2.0000 | ORAL_TABLET | ORAL | Status: DC | PRN

## 2017-10-29 MED ORDER — SODIUM CHLORIDE 0.9 % IV SOLN
5.0000 10*6.[IU] | Freq: Once | INTRAVENOUS | Status: AC
  Administered 2017-10-29: 5 10*6.[IU] via INTRAVENOUS
  Filled 2017-10-29: qty 5

## 2017-10-29 MED ORDER — DIPHENHYDRAMINE HCL 25 MG PO CAPS: 25.0000 mg | ORAL_CAPSULE | Freq: Four times a day (QID) | ORAL | Status: DC | PRN

## 2017-10-29 MED ORDER — EPHEDRINE 5 MG/ML INJ
10.0000 mg | INTRAVENOUS | Status: DC | PRN
  Filled 2017-10-29: qty 2

## 2017-10-29 MED ORDER — MEASLES, MUMPS & RUBELLA VAC ~~LOC~~ INJ
0.5000 mL | INJECTION | Freq: Once | SUBCUTANEOUS | Status: DC
  Filled 2017-10-29: qty 0.5

## 2017-10-29 MED ORDER — OXYTOCIN 40 UNITS IN LACTATED RINGERS INFUSION - SIMPLE MED: 2.5000 [IU]/h | INTRAVENOUS | Status: DC

## 2017-10-29 MED ORDER — SODIUM CHLORIDE 0.9% FLUSH: 3.0000 mL | Freq: Two times a day (BID) | INTRAVENOUS | Status: DC

## 2017-10-29 MED ORDER — BETAMETHASONE SOD PHOS & ACET 6 (3-3) MG/ML IJ SUSP
12.0000 mg | Freq: Once | INTRAMUSCULAR | Status: AC
  Administered 2017-10-29: 12 mg via INTRAMUSCULAR
  Filled 2017-10-29: qty 2

## 2017-10-29 MED ORDER — OXYTOCIN BOLUS FROM INFUSION
500.0000 mL | Freq: Once | INTRAVENOUS | Status: AC
  Administered 2017-10-29: 500 mL via INTRAVENOUS

## 2017-10-29 MED ORDER — DIBUCAINE 1 % RE OINT
1.0000 "application " | TOPICAL_OINTMENT | RECTAL | Status: DC | PRN
  Administered 2017-10-30: 1 via RECTAL
  Filled 2017-10-29: qty 28

## 2017-10-29 MED ORDER — FENTANYL CITRATE (PF) 100 MCG/2ML IJ SOLN
50.0000 ug | INTRAMUSCULAR | Status: DC | PRN
  Administered 2017-10-29: 100 ug via INTRAVENOUS
  Filled 2017-10-29: qty 2

## 2017-10-29 MED ORDER — BENZOCAINE-MENTHOL 20-0.5 % EX AERO
1.0000 "application " | INHALATION_SPRAY | CUTANEOUS | Status: DC | PRN
  Administered 2017-10-31: 1 via TOPICAL
  Filled 2017-10-29: qty 56

## 2017-10-29 MED ORDER — TETANUS-DIPHTH-ACELL PERTUSSIS 5-2.5-18.5 LF-MCG/0.5 IM SUSP
0.5000 mL | Freq: Once | INTRAMUSCULAR | Status: AC
  Administered 2017-10-30: 0.5 mL via INTRAMUSCULAR
  Filled 2017-10-29: qty 0.5

## 2017-10-29 MED ORDER — SODIUM CHLORIDE 0.9% FLUSH: 3.0000 mL | INTRAVENOUS | Status: DC | PRN

## 2017-10-29 MED ORDER — WITCH HAZEL-GLYCERIN EX PADS
1.0000 "application " | MEDICATED_PAD | CUTANEOUS | Status: DC | PRN
  Administered 2017-10-30: 1 via TOPICAL

## 2017-10-29 MED ORDER — SENNOSIDES-DOCUSATE SODIUM 8.6-50 MG PO TABS
2.0000 | ORAL_TABLET | ORAL | Status: DC
  Administered 2017-10-29 – 2017-10-30 (×2): 2 via ORAL
  Filled 2017-10-29 (×2): qty 2

## 2017-10-29 MED ORDER — LACTATED RINGERS IV SOLN: 500.0000 mL | INTRAVENOUS | Status: DC | PRN

## 2017-10-29 MED ORDER — COCONUT OIL OIL: 1.0000 "application " | TOPICAL_OIL | Status: DC | PRN

## 2017-10-29 MED ORDER — ONDANSETRON HCL 4 MG/2ML IJ SOLN: 4.0000 mg | INTRAMUSCULAR | Status: DC | PRN

## 2017-10-29 MED ORDER — ACETAMINOPHEN 325 MG PO TABS
650.0000 mg | ORAL_TABLET | ORAL | Status: DC | PRN
  Administered 2017-10-29: 650 mg via ORAL
  Filled 2017-10-29: qty 2

## 2017-10-29 MED ORDER — ONDANSETRON HCL 4 MG/2ML IJ SOLN
4.0000 mg | Freq: Four times a day (QID) | INTRAMUSCULAR | Status: DC | PRN
  Administered 2017-10-29: 4 mg via INTRAVENOUS
  Filled 2017-10-29: qty 2

## 2017-10-29 MED ORDER — DIPHENHYDRAMINE HCL 50 MG/ML IJ SOLN: 12.5000 mg | INTRAMUSCULAR | Status: DC | PRN

## 2017-10-29 MED ORDER — OXYTOCIN 40 UNITS IN LACTATED RINGERS INFUSION - SIMPLE MED
1.0000 m[IU]/min | INTRAVENOUS | Status: DC
  Filled 2017-10-29: qty 1000

## 2017-10-29 MED ORDER — ONDANSETRON HCL 4 MG PO TABS: 4.0000 mg | ORAL_TABLET | ORAL | Status: DC | PRN

## 2017-10-29 MED ORDER — OXYTOCIN 10 UNIT/ML IJ SOLN: 10.0000 [IU] | Freq: Once | INTRAMUSCULAR | Status: DC

## 2017-10-29 MED ORDER — PHENYLEPHRINE 40 MCG/ML (10ML) SYRINGE FOR IV PUSH (FOR BLOOD PRESSURE SUPPORT)
80.0000 ug | PREFILLED_SYRINGE | INTRAVENOUS | Status: DC | PRN
  Filled 2017-10-29: qty 5

## 2017-10-29 MED ORDER — PRENATAL MULTIVITAMIN CH
1.0000 | ORAL_TABLET | Freq: Every day | ORAL | Status: DC
  Administered 2017-10-30 – 2017-10-31 (×2): 1 via ORAL
  Filled 2017-10-29 (×2): qty 1

## 2017-10-29 MED ORDER — SOD CITRATE-CITRIC ACID 500-334 MG/5ML PO SOLN: 30.0000 mL | ORAL | Status: DC | PRN

## 2017-10-29 MED ORDER — SIMETHICONE 80 MG PO CHEW: 80.0000 mg | CHEWABLE_TABLET | ORAL | Status: DC | PRN

## 2017-10-29 MED ORDER — FENTANYL 2.5 MCG/ML BUPIVACAINE 1/10 % EPIDURAL INFUSION (WH - ANES): 14.0000 mL/h | INTRAMUSCULAR | Status: DC | PRN

## 2017-10-29 MED ORDER — ZOLPIDEM TARTRATE 5 MG PO TABS: 5.0000 mg | ORAL_TABLET | Freq: Every evening | ORAL | Status: DC | PRN

## 2017-10-29 MED ORDER — SODIUM CHLORIDE 0.9 % IV SOLN: 250.0000 mL | INTRAVENOUS | Status: DC | PRN

## 2017-10-29 MED ORDER — ACETAMINOPHEN 325 MG PO TABS: 650.0000 mg | ORAL_TABLET | ORAL | Status: DC | PRN

## 2017-10-29 MED ORDER — LIDOCAINE HCL (PF) 1 % IJ SOLN
30.0000 mL | INTRAMUSCULAR | Status: DC | PRN
  Filled 2017-10-29: qty 30

## 2017-10-29 MED ORDER — OXYCODONE-ACETAMINOPHEN 5-325 MG PO TABS: 1.0000 | ORAL_TABLET | ORAL | Status: DC | PRN

## 2017-10-29 MED ORDER — HYDROXYZINE HCL 50 MG PO TABS
50.0000 mg | ORAL_TABLET | Freq: Four times a day (QID) | ORAL | Status: DC | PRN
  Filled 2017-10-29: qty 1

## 2017-10-29 MED ORDER — LACTATED RINGERS IV SOLN: 500.0000 mL | Freq: Once | INTRAVENOUS | Status: DC

## 2017-10-29 MED ORDER — OXYTOCIN 40 UNITS IN LACTATED RINGERS INFUSION - SIMPLE MED
1.0000 m[IU]/min | INTRAVENOUS | Status: DC
  Administered 2017-10-29: 2 m[IU]/min via INTRAVENOUS

## 2017-10-29 NOTE — Anesthesia Pain Management Evaluation Note (Signed)
  CRNA Pain Management Visit Note  Patient: Brittney [name], 27 y.o., female  "Hello I am a member of the anesthesia team at [facility]. We have an anesthesia team available at all times to provide care throughout the hospital, including epidural management and anesthesia for C-section. I don't know your plan for the delivery whether it a natural birth, water birth, IV sedation, nitrous supplementation, doula or epidural, but we want to meet your pain goals."   1.Was your pain managed to your expectations on prior hospitalizations?   Yes   2.What is your expectation for pain management during this hospitalization?  Possibly an    Epidural. The patient has had two previous natural deliveries, but this one is an induction. She  she might try N20 or IV pain medications first.  3.How can we help you reach that goal? Be available to discuss/provide epidural anesthesia if she decides to consider it seriously.  Record the patient's initial score and the patient's pain goal.   Pain: 0  Pain Goal: 10 [facility] wants you to be able to say your pain was always managed very well.  Krystal Delduca 10/29/2017

## 2017-10-29 NOTE — MAU Provider Note (Signed)
First Provider Initiated Contact with Patient 10/29/17 1452     S: Ms. Otho NajjarQuonique Tysha Mini is a 27 y.o. [id number] at 2044w4d  who presents to MAU today complaining of leaking of fluid since 0830am. She denies vaginal bleeding. She denies contractions. She reports normal fetal movement.    O: BP 130/86 (BP Location: Right Arm)   Pulse (!) 105   Temp 98.1 F (36.7 C) (Oral)   Resp 18   Wt 82.2 kg   LMP 02/22/2017 (Exact Date)   SpO2 98% Comment: ra  BMI 31.09 kg/m  GENERAL: Well-developed, well-nourished female in no acute distress.  HEAD: Normocephalic, atraumatic.  CHEST: Normal effort of breathing, regular heart rate ABDOMEN: Soft, nontender, gravid  Cervical exam: deferred    Fetal Monitoring: Baseline: 140 Variability: moderate Accelerations: 15x15 Decelerations: none Contractions: occasional uc's  Fern positive  A: SIUP at 3544w4d  SROM  P: Admit to labor and delivery  BMZ ordered GBS culture Report called to labor team.  Rolm BookbinderNeill, Linh Hedberg M, CNM 10/29/2017 3:01 PM

## 2017-10-29 NOTE — MAU Note (Signed)
Brittney NajjarQuonique Tysha [name] is a 27 y.o. at 164w4d here in MAU reporting: possible LOF brown and red tinge. Not having to wear a pad. Onset of complaint: 830am this am Pain score: denies Vitals:   10/29/17 1436  BP: 130/86  Pulse: (!) 105  Resp: 18  Temp: 98.1 F (36.7 C)  SpO2: 98%    FHT:150 via doppler Lab orders placed from triage: ua

## 2017-10-29 NOTE — H&P (Signed)
Brittney [name] is a 27 y.o. female [phone] @ 35.4wks  presenting for SROM @ 8:30 this morning. Now also starting to have mild uterine contractions.Marland Kitchen. GDM diet controlled. OB History    Gravida  5   Para  2   Term  1   Preterm  1   AB  2   Living  2     SAB  1   TAB      Ectopic  1   Multiple  0   Live Births  2          Past Medical History:  Diagnosis Date  . Headache   . Preterm labor    Past Surgical History:  Procedure Laterality Date  . NO PAST SURGERIES     Family History: family history includes Diabetes in her maternal aunt and other; Epilepsy in her brother and sister; Hypertension in her father and mother. Social History:  reports that she has never smoked. She has never used smokeless tobacco. She reports that she does not drink alcohol or use drugs.     Maternal Diabetes: Yes:  Diabetes Type:  Diet controlled Genetic Screening: Normal Maternal Ultrasounds/Referrals: Normal Fetal Ultrasounds or other Referrals:  None Maternal Substance Abuse:  No Significant Maternal Medications:  None Significant Maternal Lab Results:  None Other Comments:  None  Review of Systems  Constitutional: Negative.   HENT: Negative.   Eyes: Negative.   Respiratory: Negative.   Cardiovascular: Negative.   Gastrointestinal: Positive for abdominal pain.  Genitourinary: Negative.   Musculoskeletal: Negative.   Skin: Negative.   Neurological: Negative.   Endo/Heme/Allergies: Negative.   Psychiatric/Behavioral: Negative.    Maternal Medical History:  Reason for admission: Rupture of membranes and contractions.   Contractions: Onset was 6-12 hours ago.   Frequency: irregular.   Perceived severity is mild.    Fetal activity: Perceived fetal activity is normal.   Last perceived fetal movement was within the past hour.    Prenatal complications: no prenatal complications Prenatal Complications - Diabetes: gestational. Diabetes is managed by diet.         Blood pressure 130/86, pulse (!) 105, temperature 98.1 F (36.7 C), temperature source Oral, resp. rate 18, weight 82.2 kg, last menstrual period 02/22/2017, SpO2 98 %, unknown if currently breastfeeding. Maternal Exam:  Uterine Assessment: Contraction strength is mild.  Contraction frequency is irregular.   Abdomen: Patient reports no abdominal tenderness. Fetal presentation: vertex  Introitus: Normal vulva. Normal vagina.  Ferning test: positive.  Nitrazine test: not done. Amniotic fluid character: not assessed.  Pelvis: adequate for delivery.   Cervix: Cervix evaluated by digital exam.     Fetal Exam Fetal Monitor Review: Mode: ultrasound.   Variability: moderate (6-25 bpm).   Pattern: accelerations present.    Fetal State Assessment: Category I - tracings are normal.     Physical Exam  Constitutional: She is oriented to person, place, and time. She appears well-developed and well-nourished.  HENT:  Head: Normocephalic.  Eyes: Pupils are equal, round, and reactive to light.  Neck: Normal range of motion.  Cardiovascular: Normal rate, regular rhythm, normal heart sounds and intact distal pulses.  Respiratory: Effort normal and breath sounds normal.  GI: Soft.  Genitourinary: Vagina normal and uterus normal.  Musculoskeletal: Normal range of motion.  Neurological: She is alert and oriented to person, place, and time. She has normal reflexes.  Skin: Skin is warm and dry.  Psychiatric: She has a normal mood and affect. Her behavior is  normal. Judgment and thought content normal.    Prenatal labs: ABO, Rh: O/Positive/-- (02/20 1638) Antibody: Negative (02/20 1638) Rubella: 2.07 (02/20 1638) RPR: Non Reactive (08/19 1026)  HBsAg: Negative (02/20 1638)  HIV: Non Reactive (08/19 1026)  GBS:     Assessment/Plan: Admit GBS neg  Anticipate vag delivery  POC discussed with Dr. Shawnie Pons will start pitocin augmentation SVE 1/70/-1  Brittney [name] 10/29/2017, 3:13 PM

## 2017-10-29 NOTE — Progress Notes (Addendum)
Otho NajjarQuonique Tysha Watton is a 27 y.o. [id number] at 6556w4d by ultrasound admitted for PROM @ 35.4 wks  Subjective:   Objective: BP 125/81   Pulse 88   Temp 98 F (36.7 C) (Oral)   Resp 18   Ht 5\' 3"  (1.6 m)   Wt 82.2 kg   LMP 02/22/2017 (Exact Date)   SpO2 98% Comment: ra  BMI 32.10 kg/m  No intake/output data recorded. No intake/output data recorded.  FHT:  FHR: 130's bpm, variability: moderate,  accelerations:  Present,  decelerations:  Absent UC:   regular, every 2-4 minutes SVE:   Dilation: 1 Effacement (%): 70 Station: -2 Exam by:: Hart RochesterLawson, CNM  Labs: Lab Results  Component Value Date   WBC 11.3 (H) 10/29/2017   HGB 11.6 (L) 10/29/2017   HCT 35.8 (L) 10/29/2017   MCV 84.0 10/29/2017   PLT 320 10/29/2017    Assessment / Plan: Augmentation of labor, progressing well  Labor: Progressing normally Preeclampsia:  no signs or symptoms of toxicity Fetal Wellbeing:  Category I Pain Control:  Labor support without medications I/D:  n/a Anticipated MOD:  NSVD  Wyvonnia DuskyMarie Myrakle Wingler 10/29/2017, 6:31 PM

## 2017-10-30 LAB — RPR: RPR Ser Ql: NONREACTIVE

## 2017-10-30 NOTE — Lactation Note (Signed)
This note was copied from a baby's chart. Lactation Consultation Note  Patient Name: Brittney [name] ZOXWR'UToday's Date: 10/30/2017 Reason for consult: Initial assessment;Early term 37-38.6wks   Initial assessment of 17 hour old LPT infant. Infant currently asleep in the crib. Infant with 3 BF for 10-15 minutes, 1 BF attempt, bottle fed formula x 6 of 0-15 cc, 3 voids and 1 stool since birth. LATCH scores 8-9. Mom reports she is concerned infant is not eating enough. Mom reports she latches well.   LPT infant handout given and reviewed. Enc mom to offer breast for up to 20 minutes and then offer bottle of EBM or formula. Enc mom to pump  Every 2-3 hours to stimulate milk production and follow with hand expression. Mom reports she has not pumped since yesterday. Reviewed supply and demand and importance of emptying breasts regularly. Reviewed use of Initiate setting on the pump, assembling, disassembling and cleaning of pump parts with mom.  [facility] and [facility] for home use. Enc mom to speak with WIC about getting a pump if infant not latching and due to LPT status.   BF Resources handout and LC Brochure given, mom informed of IP/OP Services, BF Support Groups and LC phone #. Mom informed [facility].   Enc mom to call out for feeding assistance as needed. Reviewed that LPT infants commonly do not BF effectively until they are close to their due date. Enc mom to protect her milk supply until infant more active at the breast. Reviewed supplement amounts with mom today and as infant gets older. Reviewed BM storage at room temperature.   Mom reports she has no questions/concerns at this time.    Maternal Data Has patient been taught Hand Expression?: Yes Does the patient have breastfeeding experience prior to this delivery?: Yes  Feeding Feeding Type: Bottle Fed - Formula(poor eater, fed by RN)  LATCH Score                    Interventions Interventions: Skin to skin;Breast massage;Breast compression;DEBP;Hand express  Lactation Tools Discussed/Used WIC Program: Yes Pump Review: Setup, frequency, and cleaning;Milk Storage Initiated by:: Reviewd and encouraged every 2-3 hours and follow with hand expression   Consult Status Consult Status: Follow-up Date: 10/31/17 Follow-up type: In-patient    Silas FloodSharon S Hice 10/30/2017, 1:06 PM

## 2017-10-30 NOTE — Progress Notes (Signed)
POSTPARTUM PROGRESS NOTE  Post Partum Day 1 Subjective:  Brittney [name] is a 27 y.o. [phone] 379w4d s/p SVD of a baby girl @ 1900 She has with hx of GDM. No acute events overnight.  Pt denies problems with ambulating, voiding or po intake.  She denies nausea or vomiting.  Pain is well controlled.  She has had flatus. She has not had bowel movement.  Lochia Small.   Objective: Blood pressure 109/70, pulse 67, temperature 97.9 F (36.6 C), temperature source Oral, resp. rate 18, height 5\' 3"  (1.6 m), weight 82.2 kg, last menstrual period 02/22/2017, SpO2 100 %, unknown if currently breastfeeding.  Physical Exam:  General: alert, cooperative and no distress Lochia:normal flow Chest: CTAB Heart: RRR no m/r/g Abdomen: +BS, soft, tender to palpation diffusely around umbilical region,  Uterine Fundus: firm DVT Evaluation: No calf swelling or tenderness Extremities: no LE edema b/l  Recent Labs    10/29/17 1630  HGB 11.6*  HCT 35.8*    Assessment/Plan:  ASSESSMENT: Brittney NajjarQuonique Tysha Prieur is a 27 y.o. [id number] 499w4d s/p SVD of a baby girl  #Breast and Bottlefeed.  #Hgb 11.6, no symptoms #Contraception: Depo Shots #Plan to discharge on PPD#2   LOS: 1 day   Basilio CairoFolafunmi Clarabel Marion, Medical Student 10/30/2017, 11:36 AM

## 2017-10-31 LAB — BIRTH TISSUE RECOVERY COLLECTION (PLACENTA DONATION)

## 2017-10-31 MED ORDER — IBUPROFEN 600 MG PO TABS: 600.0000 mg | ORAL_TABLET | Freq: Four times a day (QID) | ORAL | 0 refills | Status: DC | PRN

## 2017-10-31 NOTE — Lactation Note (Signed)
This note was copied from a baby's chart. Lactation Consultation Note; Mom reports baby has been latching on well. Reports she pumped twice yesterday but did not obtain anything. Reports breasts are feeling a little fuller this morning. Dad bottle feeding formula now. Reports she has WIC- offered to send referral but mom wants to use manual pump at home. Reviewed engorgement prevention and treatment. Reviewed our phone number, OP appointments and BFSG as resources for support after DC. No questions at present. To call prn  Patient Name: Brittney [name] ZOXWR'UToday's Date: 10/31/2017 Reason for consult: Follow-up assessment;Late-preterm 34-36.6wks   Maternal Data Formula Feeding for Exclusion: No Has patient been taught Hand Expression?: Yes Does the patient have breastfeeding experience prior to this delivery?: Yes  Feeding Feeding Type: Bottle Fed - Formula Nipple Type: Regular  LATCH Score                   Interventions    Lactation Tools Discussed/Used WIC Program: Yes   Consult Status Consult Status: Complete    Pamelia HoitWeeks, [name] 10/31/2017, 9:17 AM

## 2017-10-31 NOTE — Discharge Instructions (Signed)
Vaginal Delivery, Care After °Refer to this sheet in the next few weeks. These instructions provide you with information about caring for yourself after vaginal delivery. Your health care provider may also give you more specific instructions. Your treatment has been planned according to current medical practices, but problems sometimes occur. Call your health care provider if you have any problems or questions. °What can I expect after the procedure? °After vaginal delivery, it is common to have: °· Some bleeding from your vagina. °· Soreness in your abdomen, your vagina, and the area of skin between your vaginal opening and your anus (perineum). °· Pelvic cramps. °· Fatigue. ° °Follow these instructions at home: °Medicines °· Take over-the-counter and prescription medicines only as told by your health care provider. °· If you were prescribed an antibiotic medicine, take it as told by your health care provider. Do not stop taking the antibiotic until it is finished. °Driving ° °· Do not drive or operate heavy machinery while taking prescription pain medicine. °· Do not drive for 24 hours if you received a sedative. °Lifestyle °· Do not drink alcohol. This is especially important if you are breastfeeding or taking medicine to relieve pain. °· Do not use tobacco products, including cigarettes, chewing tobacco, or e-cigarettes. If you need help quitting, ask your health care provider. °Eating and drinking °· Drink at least 8 eight-ounce glasses of water every day unless you are told not to by your health care provider. If you choose to breastfeed your baby, you may need to drink more water than this. °· Eat high-fiber foods every day. These foods may help prevent or relieve constipation. High-fiber foods include: °? Whole grain cereals and breads. °? Brown rice. °? Beans. °? Fresh fruits and vegetables. °Activity °· Return to your normal activities as told by your health care provider. Ask your health care provider  what activities are safe for you. °· Rest as much as possible. Try to rest or take a nap when your baby is sleeping. °· Do not lift anything that is heavier than your baby or 10 lb (4.5 kg) until your health care provider says that it is safe. °· Talk with your health care provider about when you can engage in sexual activity. This may depend on your: °? Risk of infection. °? Rate of healing. °? Comfort and desire to engage in sexual activity. °Vaginal Care °· If you have an episiotomy or a vaginal tear, check the area every day for signs of infection. Check for: °? More redness, swelling, or pain. °? More fluid or blood. °? Warmth. °? Pus or a bad smell. °· Do not use tampons or douches until your health care provider says this is safe. °· Watch for any blood clots that may pass from your vagina. These may look like clumps of dark red, brown, or black discharge. °General instructions °· Keep your perineum clean and dry as told by your health care provider. °· Wear loose, comfortable clothing. °· Wipe from front to back when you use the toilet. °· Ask your health care provider if you can shower or take a bath. If you had an episiotomy or a perineal tear during labor and delivery, your health care provider may tell you not to take baths for a certain length of time. °· Wear a bra that supports your breasts and fits you well. °· If possible, have someone help you with household activities and help care for your baby for at least a few days after   you leave the hospital. °· Keep all follow-up visits for you and your baby as told by your health care provider. This is important. °Contact a health care provider if: °· You have: °? Vaginal discharge that has a bad smell. °? Difficulty urinating. °? Pain when urinating. °? A sudden increase or decrease in the frequency of your bowel movements. °? More redness, swelling, or pain around your episiotomy or vaginal tear. °? More fluid or blood coming from your episiotomy or  vaginal tear. °? Pus or a bad smell coming from your episiotomy or vaginal tear. °? A fever. °? A rash. °? Little or no interest in activities you used to enjoy. °? Questions about caring for yourself or your baby. °· Your episiotomy or vaginal tear feels warm to the touch. °· Your episiotomy or vaginal tear is separating or does not appear to be healing. °· Your breasts are painful, hard, or turn red. °· You feel unusually sad or worried. °· You feel nauseous or you vomit. °· You pass large blood clots from your vagina. If you pass a blood clot from your vagina, save it to show to your health care provider. Do not flush blood clots down the toilet without having your health care provider look at them. °· You urinate more than usual. °· You are dizzy or light-headed. °· You have not breastfed at all and you have not had a menstrual period for 12 weeks after delivery. °· You have stopped breastfeeding and you have not had a menstrual period for 12 weeks after you stopped breastfeeding. °Get help right away if: °· You have: °? Pain that does not go away or does not get better with medicine. °? Chest pain. °? Difficulty breathing. °? Blurred vision or spots in your vision. °? Thoughts about hurting yourself or your baby. °· You develop pain in your abdomen or in one of your legs. °· You develop a severe headache. °· You faint. °· You bleed from your vagina so much that you fill two sanitary pads in one hour. °This information is not intended to replace advice given to you by your health care provider. Make sure you discuss any questions you have with your health care provider. °Document Released: 01/22/2000 Document Revised: 07/08/2015 Document Reviewed: 02/08/2015 °[employer]vier Interactive Patient Education © 2018 [employer] ° °

## 2017-10-31 NOTE — Discharge Summary (Signed)
OB Discharge Summary     Patient Name: Brittney NajjarQuonique Tysha Stare DOB: 01/29/1991 MRN: [id number]  Date of admission: 10/29/2017 Delivering MD: Wyvonnia DuskyLAWSON, MARIE D   Date of discharge: 10/31/2017  Admitting diagnosis: 35.4 WKS, LEAKING Intrauterine pregnancy: 10230w4d     Secondary diagnosis:  Active Problems:   Supervision of normal pregnancy, antepartum   Abnormal glucose affecting pregnancy   Gestational diabetes mellitus (GDM) affecting pregnancy, antepartum   Premature rupture of membranes  Additional problems: none     Discharge diagnosis: Preterm Pregnancy Delivered and GDM A1                                                                                                Post partum procedures:none  Augmentation: Pitocin  Complications: None  Hospital course:  Induction of Labor With Vaginal Delivery   27 y.o. yo [id number] at 7830w4d was admitted to the hospital 10/29/2017 for induction of labor.  Indication for induction: PPROM. She was given one dose of BMX for prematurity, and she also had a dose of PCN prior to GBS resulting negative. With Pitocin, patient had an uncomplicated labor course as follows: Membrane Rupture Time/Date: 8:00 AM ,10/29/2017   Intrapartum Procedures: Episiotomy: None [1]                                         Lacerations:  None [1]  Patient had delivery of a Viable infant.  Information for the patient's newborn:  Berneice GandyJones, Girl Anairis [[id number]][[id number]]  Delivery Method: Vag-Spont   10/29/2017  Details of delivery can be found in separate delivery note.  Patient had a routine postpartum course. Patient is discharged home 10/31/17.  Physical exam  Vitals:   10/30/17 0741 10/30/17 1312 10/30/17 2150 10/31/17 0658  BP: 109/70 113/70 (!) 109/58 102/65  Pulse: 67 70 74 71  Resp: 18 18 18 18   Temp: 97.9 F (36.6 C) 99 F (37.2 C) 98.4 F (36.9 C) 97.8 F (36.6 C)  TempSrc: Oral Oral Oral Oral  SpO2:      Weight:      Height:       General: alert and  cooperative Lochia: appropriate Uterine Fundus: firm Incision: N/A DVT Evaluation: No evidence of DVT seen on physical exam. Labs: Lab Results  Component Value Date   WBC 11.3 (H) 10/29/2017   HGB 11.6 (L) 10/29/2017   HCT 35.8 (L) 10/29/2017   MCV 84.0 10/29/2017   PLT 320 10/29/2017   CMP Latest Ref Rng & Units 02/27/2015  Glucose 65 - 99 mg/dL 90  BUN 6 - 20 mg/dL 9  Creatinine 8.460.44 - 9.621.00 mg/dL 9.520.91  Sodium 841135 - 324145 mmol/L 138  Potassium 3.5 - 5.1 mmol/L 3.6  Chloride 101 - 111 mmol/L 103  CO2 22 - 32 mmol/L 25  Calcium 8.9 - 10.3 mg/dL 9.5  Total Protein 6.5 - 8.1 g/dL 7.1  Total Bilirubin 0.3 - 1.2 mg/dL 0.6  Alkaline Phos 38 - 126 U/L 47  AST 15 - 41  U/L 17  ALT 14 - 54 U/L 15    Discharge instruction: per After Visit Summary and "Baby and Me Booklet".  After visit meds:  Allergies as of 10/31/2017   No Known Allergies     Medication List    STOP taking these medications   loratadine 10 MG tablet Commonly known as:  CLARITIN     TAKE these medications   ferrous sulfate 325 (65 FE) MG tablet Take 1 tablet (325 mg total) by mouth 2 (two) times daily with a meal. What changed:  when to take this   ibuprofen 600 MG tablet Commonly known as:  ADVIL,MOTRIN Take 1 tablet (600 mg total) by mouth every 6 (six) hours as needed.   VITAFOL ULTRA 29-0.6-0.4-200 MG Caps Take 1 capsule by mouth daily before breakfast.       Diet: routine diet  Activity: Advance as tolerated. Pelvic rest for 6 weeks.   Outpatient follow up:6 weeks Follow up Appt: Future Appointments  Date Time Provider Department Center  11/06/2017  9:45 AM Constant, Gigi Gin, MD [facility] None   Follow up Visit:No follow-ups on file.  Postpartum contraception: Depo Provera  Newborn Data: Live born female  Birth Weight: 6 lb 9.8 oz (2999 g) APGAR: 7, 9  Newborn Delivery   Birth date/time:  10/29/2017 19:17:00 Delivery type:  Vaginal, Spontaneous     Baby Feeding:  Breast Disposition:home with mother   10/31/2017 Cam Hai, CNM  10:03 AM

## 2017-11-06 ENCOUNTER — Encounter: Payer: Medicaid Other | Admitting: Obstetrics and Gynecology

## 2017-12-08 ENCOUNTER — Ambulatory Visit (INDEPENDENT_AMBULATORY_CARE_PROVIDER_SITE_OTHER): Payer: Medicaid Other | Admitting: Family Medicine

## 2017-12-08 ENCOUNTER — Other Ambulatory Visit: Payer: Self-pay

## 2017-12-08 ENCOUNTER — Encounter: Payer: Self-pay | Admitting: Family Medicine

## 2017-12-08 DIAGNOSIS — Z309 Encounter for contraceptive management, unspecified: Secondary | ICD-10-CM

## 2017-12-08 DIAGNOSIS — Z8632 Personal history of gestational diabetes: Secondary | ICD-10-CM | POA: Diagnosis not present

## 2017-12-08 LAB — GLUCOSE, POCT (MANUAL RESULT ENTRY): POC GLUCOSE: 88 mg/dL (ref 70–99)

## 2017-12-08 MED ORDER — MEDROXYPROGESTERONE ACETATE 150 MG/ML IM SUSY
150.0000 mg | PREFILLED_SYRINGE | Freq: Once | INTRAMUSCULAR | Status: AC
  Administered 2017-12-08: 150 mg via INTRAMUSCULAR

## 2017-12-08 MED ORDER — MEDROXYPROGESTERONE ACETATE 150 MG/ML IM SUSP: 150.0000 mg | INTRAMUSCULAR | 0 refills | Status: DC

## 2017-12-08 NOTE — Progress Notes (Signed)
     Subjective:     Brittney [name] is a 27 y.o. female who presents for a postpartum visit. She is 6 weeks postpartum following a spontaneous vaginal delivery. I have fully reviewed the prenatal and intrapartum course. The delivery was at 35.4 gestational weeks. Outcome: spontaneous vaginal delivery. Anesthesia: none. Postpartum course has been uncomplicated. Baby's course has been doing well. Baby is feeding by formula (initially breast but transitioned to formula due to difficulty/pain). Bleeding no bleeding (Lochia resolved, started her period today). Bowel function is normal. Bladder function is normal. Patient is not sexually active. Contraception method is Depo-Provera injections. Postpartum depression screening: negative. (Edinburgh:1)  She has used Depo in the past and feels comfortable with that form of birth control. She feels mildly intimidated by the nexplanon after watching ab Youtube video about it's insertion. She has read about IUDs in the past and is not interested in pursuing one at this time.  Review of Systems Pertinent items are noted in HPI.   Objective:    BP 102/64   Pulse 68   Temp 98.6 F (37 C) (Oral)   Ht 5\' 3"  (1.6 m)   Wt 168 lb (76.2 kg)   LMP 12/08/2017 (Exact Date)   SpO2 98%   Breastfeeding? No   BMI 29.76 kg/m   General:  alert, cooperative and appears stated age   Breasts:  for A1c check.  Lungs: clear to auscultation bilaterally  Heart:  regular rate and rhythm, S1, S2 normal, no murmur, click, rub or gallop  Abdomen: soft, non-tender; bowel sounds normal; no masses,  no organomegaly and no fundus palpated                          Assessment:   Ms. Savino is a 27 y/o female who is now just over 6 weeks postpartum from a SVD.  She appears to be well recovered and adjusting to life well with her new baby. We will follow up on her A1c in two months and continue to see her every three months for depo-provera injections.  Plan:    1.  Contraception: Depo-Provera injections 2. Blood glucose normal today at 88. Will f/u with A1c at 3 months post partum 3. Depo-provera shot given today 4. Follow up in: 2 months or as needed.

## 2017-12-08 NOTE — Patient Instructions (Signed)
It was great to see you today. I'm glad that you're doing so well with new baby!  We will keep an eye on your blood sugar and check your long term (3 month) blood glucose at the next appointment.  I'll see you again in two months!

## 2017-12-12 ENCOUNTER — Other Ambulatory Visit: Payer: Medicaid Other

## 2017-12-13 ENCOUNTER — Ambulatory Visit: Payer: Medicaid Other | Admitting: Obstetrics and Gynecology

## 2017-12-13 ENCOUNTER — Other Ambulatory Visit: Payer: Medicaid Other

## 2017-12-13 NOTE — Progress Notes (Deleted)
Post Partum Exam  Brittney [name] is a 27 y.o. [phone] female who presents for a postpartum visit. She is {1-10:13787} {time; units:18646} postpartum following a {delivery:12449}. I have fully reviewed the prenatal and intrapartum course. The delivery was at *** gestational weeks.  Anesthesia: {anesthesia types:812}. Postpartum course has been ***. Baby's course has been ***. Baby is feeding by {breast/bottle:69}. Bleeding {vag bleed:12292}. Bowel function is {normal:32111}. Bladder function is {normal:32111}. Patient {is/is not:9024} sexually active. Contraception method is {contraceptive method:5051}. Postpartum depression screening:neg  {Common ambulatory SmartLinks:19316} Last pap smear done *** and was {Desc; normal/abnormal:11317::"Normal"}  Review of Systems {ros; complete:30496}    Objective:  Last menstrual period 12/08/2017, not currently breastfeeding.  General:  {gen appearance:16600}   Breasts:  {breast exam:1202::"inspection negative, no nipple discharge or bleeding, no masses or nodularity palpable"}  Lungs: {lung exam:16931}  Heart:  {heart exam:5510}  Abdomen: {abdomen exam:16834}   Vulva:  {labia exam:12198}  Vagina: {vagina exam:12200}  Cervix:  {cervix exam:14595}  Corpus: {uterus exam:12215}  Adnexa:  {adnexa exam:12223}  Rectal Exam: {rectal/vaginal exam:12274}        Assessment:    *** postpartum exam. Pap smear {done:10129} at today's visit.   Plan:   1. Contraception: {method:5051} 2. *** 3. Follow up in: {1-10:13787} {time; units:19136} or as needed.

## 2018-03-01 ENCOUNTER — Ambulatory Visit (INDEPENDENT_AMBULATORY_CARE_PROVIDER_SITE_OTHER): Payer: Medicaid Other

## 2018-03-01 DIAGNOSIS — Z3042 Encounter for surveillance of injectable contraceptive: Secondary | ICD-10-CM | POA: Diagnosis not present

## 2018-03-01 MED ORDER — MEDROXYPROGESTERONE ACETATE 150 MG/ML IM SUSY
150.0000 mg | PREFILLED_SYRINGE | Freq: Once | INTRAMUSCULAR | Status: AC
  Administered 2018-03-01: 150 mg via INTRAMUSCULAR

## 2018-03-01 NOTE — Progress Notes (Signed)
   Patient in to nurse clinic within dates for Depo-Provera injection. Injection given RUOQ. Next injection due 05/18/18-06/01/18. Reminder card given. Ples Specter, RN [facility] RN)

## 2018-03-12 IMAGING — US US OB < 14 WEEKS - US OB TV
1 series · 15 of 28 positions shown · non-contrast
Comparison: None for this gestation

CLINICAL DATA: First trimester pregnancy, history of ectopic
pregnancy

EXAM:
OBSTETRIC <14 WK US AND TRANSVAGINAL OB US
TECHNIQUE: Both transabdominal and transvaginal ultrasound examinations were
performed for complete evaluation of the gestation as well as the
maternal uterus, adnexal regions, and pelvic cul-de-sac.
Transvaginal technique was performed to assess early pregnancy.

[Series 1: us ob < 14 weeks - us ob tv · 51 acquisitions, 15 frames shown]
[im 1/51]
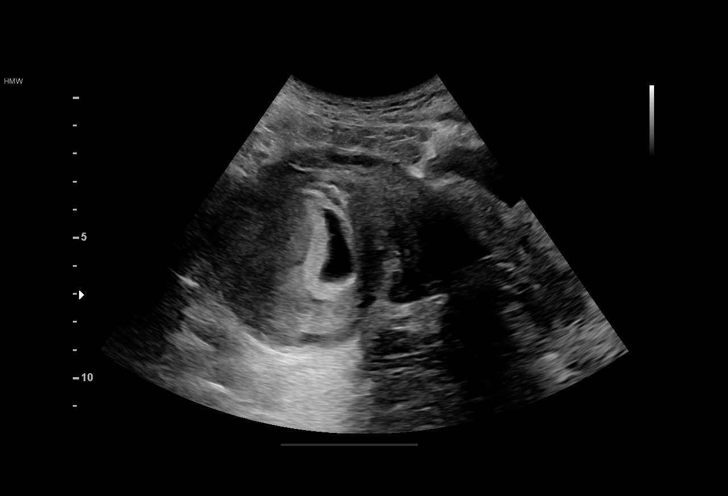
[im 4/51]
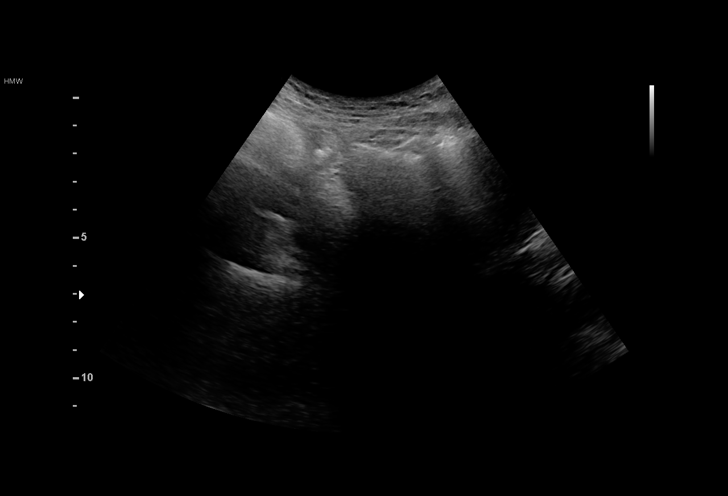
[im 8/51]
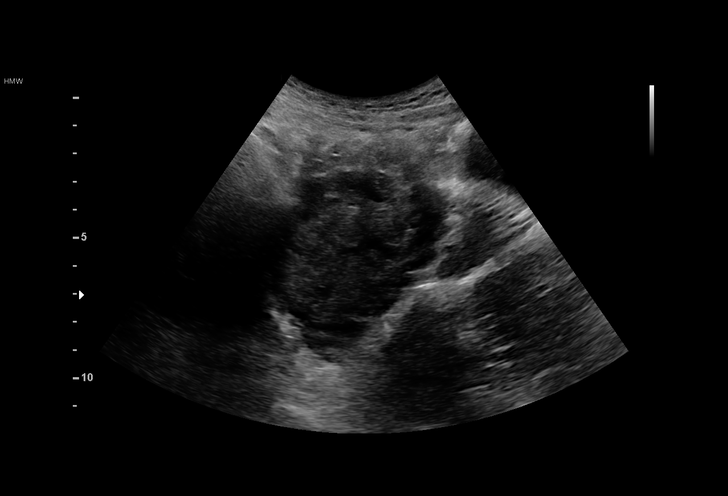
[im 12/51]
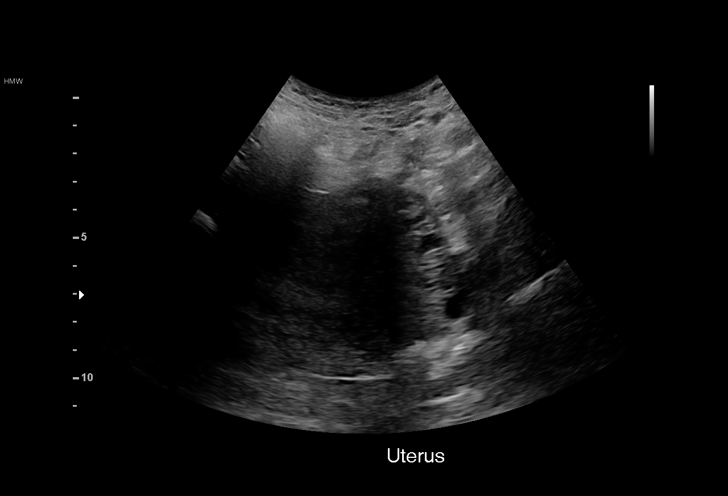
[im 15/51]
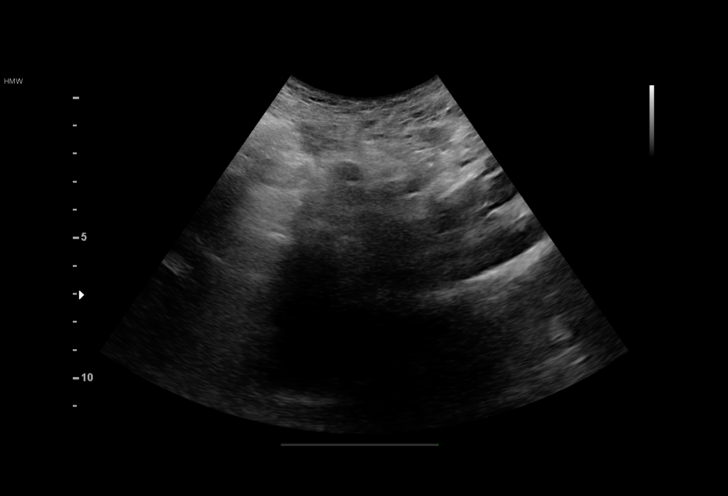
[im 19/51]
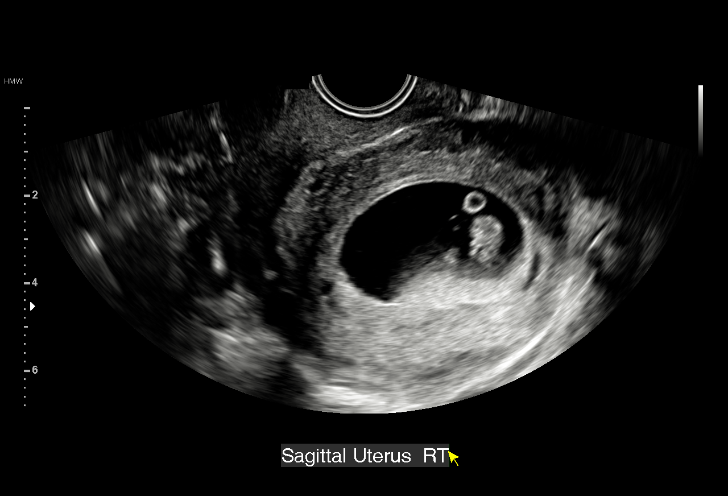
[im 23/51]
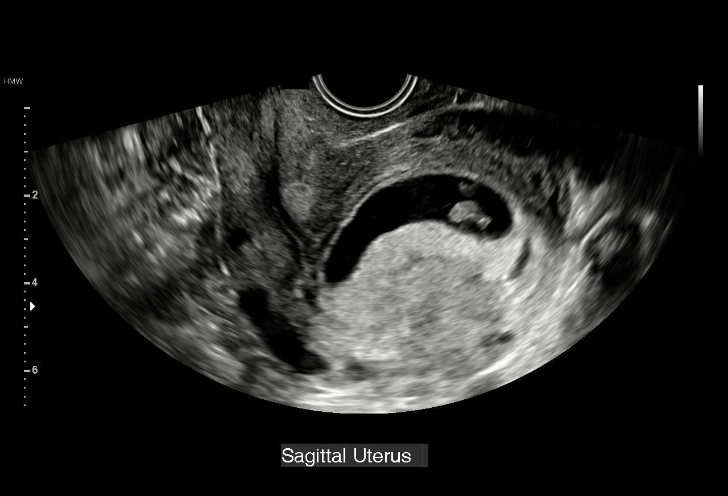
[im 26/51]
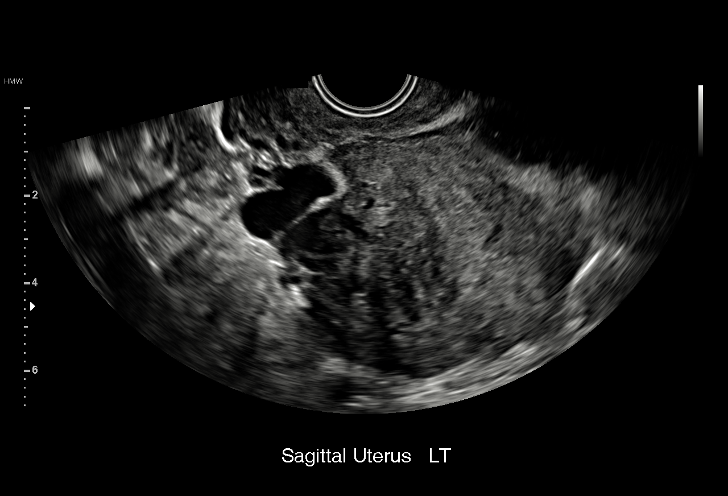
[im 28/51]
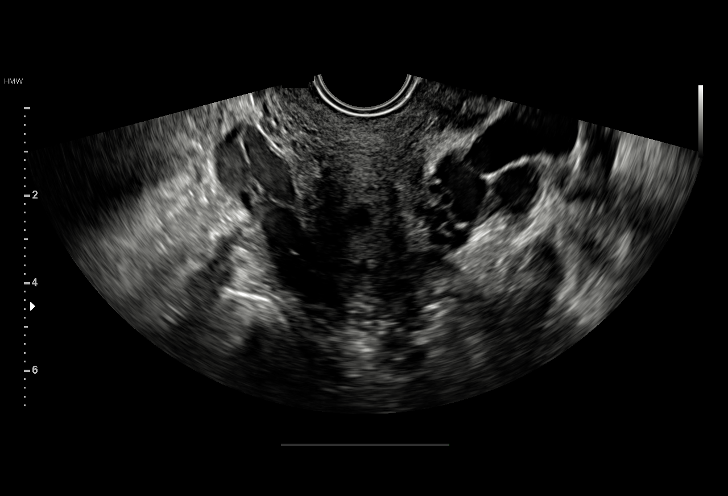
[im 32/51]
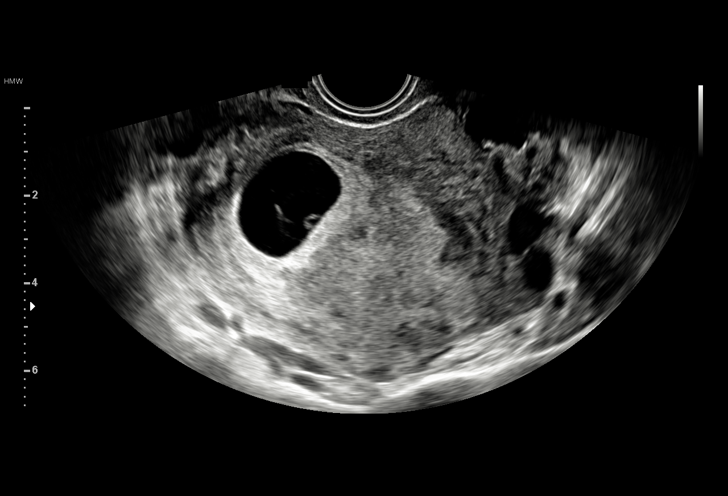
[im 36/51]
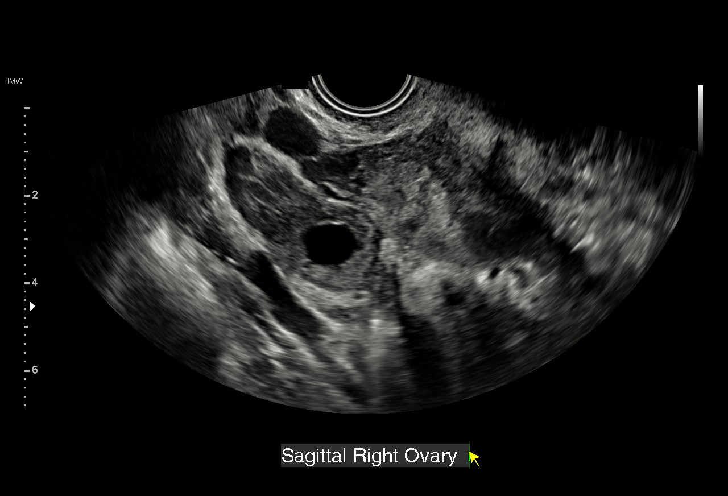
[im 39/51]
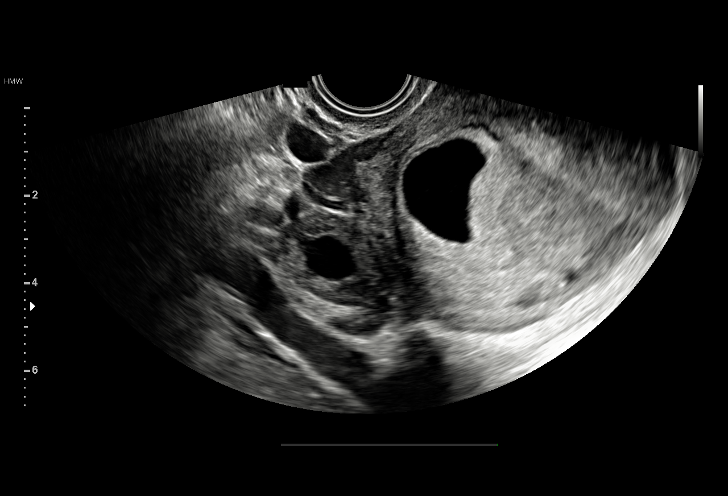
[im 43/51]
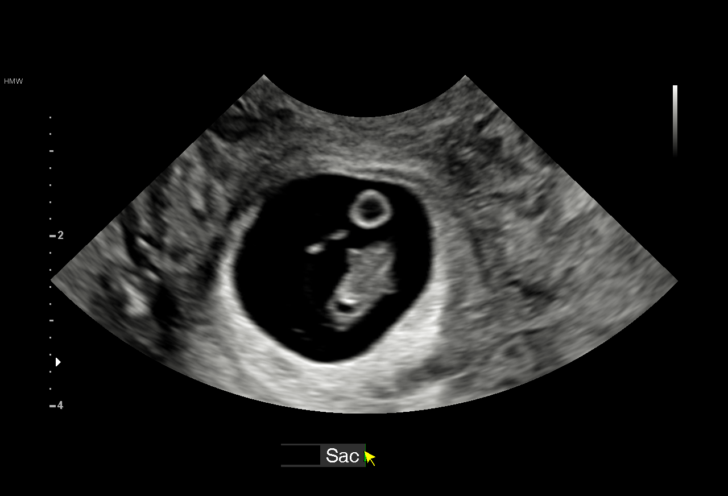
[im 47/51]
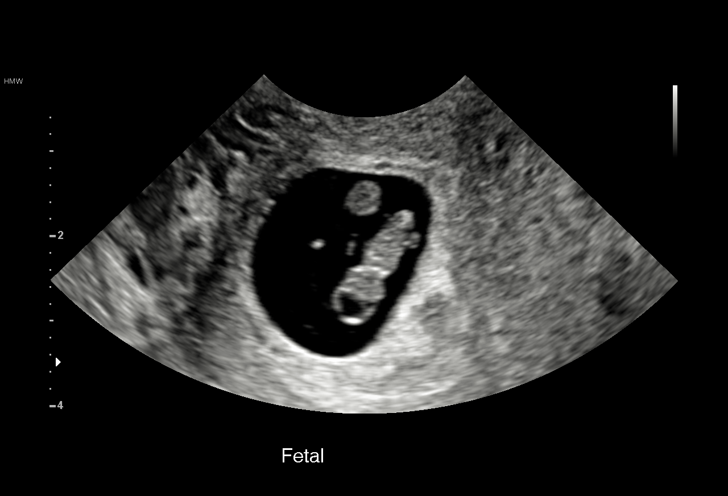
[im 51/51]
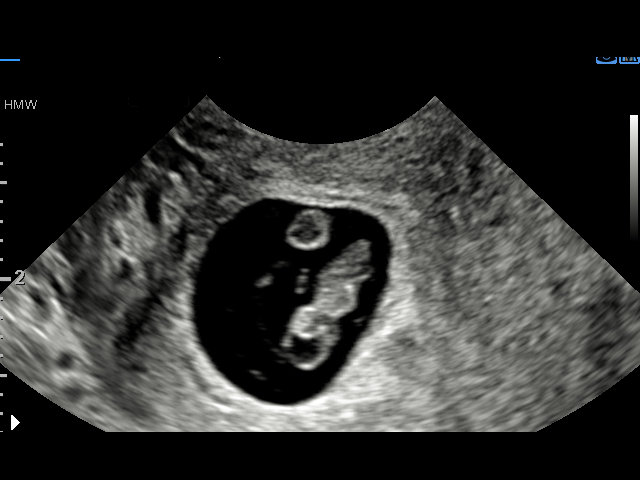

[15 of 28 positions shown; findings below may reference images not displayed]

FINDINGS: Intrauterine gestational sac: Present

Yolk sac:  Present

Embryo:  Present

Cardiac Activity: Present

Heart Rate: 174 bpm

CRL:  15.3 mm   7 w   6 d                  US EDC: 11/25/2017

Subchorionic hemorrhage: Very small subchronic hemorrhage

Maternal uterus/adnexae: Unremarkable ovaries. No free fluid or
adnexal mass.
IMPRESSION: Single live intrauterine gestation at 7 weeks 6 days EGA.

Very small subchronic hemorrhage.

## 2018-04-04 ENCOUNTER — Ambulatory Visit (INDEPENDENT_AMBULATORY_CARE_PROVIDER_SITE_OTHER): Payer: Medicaid Other | Admitting: Student in an Organized Health Care Education/Training Program

## 2018-04-04 VITALS — BP 102/60 | HR 98 | Temp 98.5°F | Wt 162.5 lb

## 2018-04-04 DIAGNOSIS — J020 Streptococcal pharyngitis: Secondary | ICD-10-CM | POA: Diagnosis not present

## 2018-04-04 DIAGNOSIS — J029 Acute pharyngitis, unspecified: Secondary | ICD-10-CM

## 2018-04-04 LAB — POCT RAPID STREP A (OFFICE): Rapid Strep A Screen: POSITIVE — AB

## 2018-04-04 MED ORDER — AMOXICILLIN 500 MG PO CAPS: 500.0000 mg | ORAL_CAPSULE | Freq: Two times a day (BID) | ORAL | 0 refills | Status: DC

## 2018-04-04 MED ORDER — CLINDAMYCIN HCL 300 MG PO CAPS: 300.0000 mg | ORAL_CAPSULE | Freq: Three times a day (TID) | ORAL | 0 refills | Status: DC

## 2018-04-04 NOTE — Patient Instructions (Signed)
It was a pleasure seeing you today in our clinic. Here is the treatment plan we have discussed and agreed upon together:  I sent antibiotics for your infection.  Please stay hydrated. If your throat becomes much more swollen or you have difficulty drinking fluids or breathing you need to seek medical attention right away.  Our clinic's number is [phone]. Please call with questions or concerns about what we discussed today.  Be well, Dr. Mosetta Putt

## 2018-04-04 NOTE — Progress Notes (Signed)
   CC: Sore throat  HPI: Brittney [name] is a 28 y.o. female with PMH significant for:  Patient Active Problem List   Diagnosis Date Noted  . Streptococcal sore throat 04/07/2018  . Premature rupture of membranes 10/29/2017  . Previous preterm delivery, antepartum 10/23/2017  . Gestational diabetes mellitus (GDM) affecting pregnancy, antepartum 10/23/2017  . Abnormal glucose affecting pregnancy 09/21/2017  . Supervision of normal pregnancy, antepartum 05/05/2017  . Vaginal discharge 10/11/2016  . Eczema 12/23/2014    Patient reports 4 days of worsening sore throat and subjective fevers. No cough. No voice changes. No rhinorrhea or congestion. She has not had N/V or urinary symptoms. She was recently interacting with her young nieces and nephews over the weekend, though none of them are currently sick.  Review of Symptoms:  See HPI for ROS.   CC, SH/smoking status, and VS noted.  Objective: BP 102/60   Pulse 98   Temp 98.5 F (36.9 C) (Oral)   Wt 162 lb 8 oz (73.7 kg)   LMP  (LMP Unknown) Comment: depo preva  SpO2 98%   BMI 28.79 kg/m  GEN: NAD, alert, cooperative, and pleasant. EYE: no conjunctival injection, pupils equally round and reactive to light ENMT: normal tympanic light reflex, +erythematous tonsils with white exudate NECK: full ROM, no LAD RESPIRATORY: clear to auscultation bilaterally with no wheezes, rhonchi or rales, good effort CV: RRR, no m/r/g GI: soft, non-tender, non-distended, no hepatosplenomegaly SKIN: warm and dry, no rashes or lesions NEURO: II-XII grossly intact, normal gait, peripheral sensation intact PSYCH: AAOx3, appropriate affect   Assessment and plan:  Streptococcal sore throat Strep test positive. Patient endorses getting a rash when she previously took amoxicillin. - clindamycin (CLEOCIN) 300 MG capsule; Take 1 capsule (300 mg total) by mouth 3 (three) times daily.  Dispense: 30 capsule; Refill: 0 - push fluids - return  precautions discussed    Orders Placed This Encounter  Procedures  . POCT rapid strep A    Meds ordered this encounter  Medications  . clindamycin (CLEOCIN) 300 MG capsule    Sig: Take 1 capsule (300 mg total) by mouth 3 (three) times daily.    Dispense:  30 capsule    Refill:  0     Howard Pouch, MD,MS,  PGY3 04/07/2018 7:11 AM

## 2018-04-07 DIAGNOSIS — J020 Streptococcal pharyngitis: Secondary | ICD-10-CM | POA: Insufficient documentation

## 2018-04-07 NOTE — Assessment & Plan Note (Signed)
Strep test positive. Patient endorses getting a rash when she previously took amoxicillin. - clindamycin (CLEOCIN) 300 MG capsule; Take 1 capsule (300 mg total) by mouth 3 (three) times daily.  Dispense: 30 capsule; Refill: 0 - push fluids - return precautions discussed

## 2018-05-31 ENCOUNTER — Other Ambulatory Visit: Payer: Self-pay

## 2018-05-31 ENCOUNTER — Ambulatory Visit (INDEPENDENT_AMBULATORY_CARE_PROVIDER_SITE_OTHER): Payer: Medicaid Other

## 2018-05-31 DIAGNOSIS — Z30019 Encounter for initial prescription of contraceptives, unspecified: Secondary | ICD-10-CM

## 2018-05-31 MED ORDER — MEDROXYPROGESTERONE ACETATE 150 MG/ML IM SUSP
150.0000 mg | Freq: Once | INTRAMUSCULAR | Status: AC
  Administered 2018-05-31: 150 mg via INTRAMUSCULAR

## 2018-05-31 NOTE — Progress Notes (Signed)
Pt presents in nurse clinic for depo injection. Pt is within dates. Injection given LUOQ, site unremarkable. Pt due for next depo injection, 7/9-7/23, reminder card given.

## 2019-02-08 NOTE — L&D Delivery Note (Addendum)
Brittney [name] is a 29 y.o. [phone] s/p precipitous delivery in MAU at [redacted]w[redacted]d.   ROM: 0h 8m with yellow fluid GBS Status: negative Maximum Maternal Temperature: 98.7  Labor Progress and Delivery: . She presented to MAU complete with BBOW, urge to push. SROM occurred @0501 . Patient precipitously delivered @0504  without augmentation. Head delivered. Loose nuchal cord present, reduced. Shoulder and body delivered in usual fashion. Infant with spontaneous cry, placed on mother's abdomen, dried and stimulated. Cord clamped x 2 after 1-minute delay, and cut by father of baby. Cord blood drawn. Placenta delivered spontaneously with gentle cord traction. Fundus firm with massage and Pitocin IM. Labia, perineum, vagina, and cervix inspected inspected without laceration.  Mom and baby stable prior to transfer to postpartum. She plans on formula feeding. She is unsure of method for birth control, considering Nexplanon.  Delivery Note At 5:04 AM a viable and healthy female was delivered via Vaginal, Spontaneous (Presentation: Left Occiput Anterior).  APGAR: 7,8 ; weight pending .   Placenta status: Spontaneous, Intact.  Cord: 2 vessels with the following complications: Precipitous delivery  Anesthesia: None Episiotomy: None Lacerations: None Suture Repair: none Est. Blood Loss (mL): 125  Mom to postpartum.  Baby to Couplet care / Skin to Skin.  CNM 11/18/2019, 5:32 AM

## 2019-02-12 DIAGNOSIS — Z03818 Encounter for observation for suspected exposure to other biological agents ruled out: Secondary | ICD-10-CM | POA: Diagnosis not present

## 2019-04-10 ENCOUNTER — Ambulatory Visit: Payer: Medicaid Other

## 2019-04-13 ENCOUNTER — Encounter (HOSPITAL_COMMUNITY): Payer: Self-pay | Admitting: *Deleted

## 2019-04-13 ENCOUNTER — Inpatient Hospital Stay (HOSPITAL_COMMUNITY)
Admission: AD | Admit: 2019-04-13 | Discharge: 2019-04-13 | Disposition: A | Discharge status: Home / Self Care | Payer: Medicaid Other | Attending: Family Medicine | Admitting: Family Medicine

## 2019-04-13 ENCOUNTER — Inpatient Hospital Stay (HOSPITAL_COMMUNITY): Payer: Medicaid Other

## 2019-04-13 DIAGNOSIS — O4690 Antepartum hemorrhage, unspecified, unspecified trimester: Secondary | ICD-10-CM

## 2019-04-13 DIAGNOSIS — O208 Other hemorrhage in early pregnancy: Secondary | ICD-10-CM | POA: Insufficient documentation

## 2019-04-13 DIAGNOSIS — Z880 Allergy status to penicillin: Secondary | ICD-10-CM | POA: Diagnosis not present

## 2019-04-13 DIAGNOSIS — O209 Hemorrhage in early pregnancy, unspecified: Secondary | ICD-10-CM | POA: Diagnosis not present

## 2019-04-13 DIAGNOSIS — Z3A01 Less than 8 weeks gestation of pregnancy: Secondary | ICD-10-CM | POA: Insufficient documentation

## 2019-04-13 DIAGNOSIS — Z6790 Unspecified blood type, Rh positive: Secondary | ICD-10-CM

## 2019-04-13 DIAGNOSIS — O418X10 Other specified disorders of amniotic fluid and membranes, first trimester, not applicable or unspecified: Secondary | ICD-10-CM

## 2019-04-13 LAB — COMPREHENSIVE METABOLIC PANEL
ALT: 10 U/L (ref 0–44)
AST: 11 U/L — ABNORMAL LOW (ref 15–41)
Albumin: 3.6 g/dL (ref 3.5–5.0)
Alkaline Phosphatase: 39 U/L (ref 38–126)
Anion gap: 8 (ref 5–15)
BUN: 8 mg/dL (ref 6–20)
CO2: 23 mmol/L (ref 22–32)
Calcium: 9.5 mg/dL (ref 8.9–10.3)
Chloride: 105 mmol/L (ref 98–111)
Creatinine, Ser: 0.8 mg/dL (ref 0.44–1.00)
GFR calc Af Amer: >60 mL/min (ref >60)
GFR calc non Af Amer: >60 mL/min (ref >60)
Glucose, Bld: 101 mg/dL — ABNORMAL HIGH (ref 70–99)
Potassium: 4.1 mmol/L (ref 3.5–5.1)
Sodium: 136 mmol/L (ref 135–145)
Total Bilirubin: 0.9 mg/dL (ref 0.3–1.2)
Total Protein: 7.1 g/dL (ref 6.5–8.1)

## 2019-04-13 LAB — URINALYSIS, ROUTINE W REFLEX MICROSCOPIC
Bilirubin Urine: NEGATIVE
Glucose, UA: NEGATIVE mg/dL
Hgb urine dipstick: NEGATIVE
Ketones, ur: NEGATIVE mg/dL
Leukocytes,Ua: NEGATIVE
Nitrite: NEGATIVE
Protein, ur: NEGATIVE mg/dL
Specific Gravity, Urine: 1.009 (ref 1.005–1.030)
pH: 8 (ref 5.0–8.0)

## 2019-04-13 LAB — CBC
HCT: 36.6 % (ref 36.0–46.0)
Hemoglobin: 12.2 g/dL (ref 12.0–15.0)
MCH: 30.1 pg (ref 26.0–34.0)
MCHC: 33.3 g/dL (ref 30.0–36.0)
MCV: 90.4 fL (ref 80.0–100.0)
Platelets: 333 10*3/uL (ref 150–400)
RBC: 4.05 MIL/uL (ref 3.87–5.11)
RDW: 12.7 % (ref 11.5–15.5)
WBC: 9.7 10*3/uL (ref 4.0–10.5)
nRBC: 0 % (ref 0.0–0.2)

## 2019-04-13 LAB — WET PREP, GENITAL
Clue Cells Wet Prep HPF POC: NONE SEEN
Sperm: NONE SEEN
Trich, Wet Prep: NONE SEEN
Yeast Wet Prep HPF POC: NONE SEEN

## 2019-04-13 LAB — HCG, QUANTITATIVE, PREGNANCY: hCG, Beta Chain, Quant, S: 51581 m[IU]/mL — ABNORMAL HIGH (ref <5)

## 2019-04-13 LAB — PREGNANCY, URINE: Preg Test, Ur: POSITIVE — AB

## 2019-04-13 NOTE — Discharge Instructions (Signed)
Abdominal Pain During Pregnancy  Abdominal pain is common during pregnancy, and has many possible causes. Some causes are more serious than others, and sometimes the cause is not known. Abdominal pain can be a sign that labor is starting. It can also be caused by normal growth and stretching of muscles and ligaments during pregnancy. Always tell your health care provider if you have any abdominal pain. Follow these instructions at home:  Do not have sex or put anything in your vagina until your pain goes away completely.  Get plenty of rest until your pain improves.  Drink enough fluid to keep your urine pale yellow.  Take over-the-counter and prescription medicines only as told by your health care provider.  Keep all follow-up visits as told by your health care provider. This is important. Contact a health care provider if:  Your pain continues or gets worse after resting.  You have lower abdominal pain that: ? Comes and goes at regular intervals. ? Spreads to your back. ? Is similar to menstrual cramps.  You have pain or burning when you urinate. Get help right away if:  You have a fever or chills.  You have vaginal bleeding.  You are leaking fluid from your vagina.  You are passing tissue from your vagina.  You have vomiting or diarrhea that lasts for more than 24 hours.  Your baby is moving less than usual.  You feel very weak or faint.  You have shortness of breath.  You develop severe pain in your upper abdomen. Summary  Abdominal pain is common during pregnancy, and has many possible causes.  If you experience abdominal pain during pregnancy, tell your health care provider right away.  Follow your health care provider's home care instructions and keep all follow-up visits as directed. This information is not intended to replace advice given to you by your health care provider. Make sure you discuss any questions you have with your health care  provider. Document Revised: 05/14/2018 Document Reviewed: 04/28/2016 Elsevier Patient Education  2020 [employer]                     Safe Medications in Pregnancy    Acne: Benzoyl Peroxide Salicylic Acid  Backache/Headache: Tylenol: 2 regular strength every 4 hours OR              2 Extra strength every 6 hours  Colds/Coughs/Allergies: Benadryl (alcohol free) 25 mg every 6 hours as needed Breath right strips Claritin Cepacol throat lozenges Chloraseptic throat spray Cold-Eeze- up to three times per day Cough drops, alcohol free Flonase (by prescription only) Guaifenesin Mucinex Robitussin DM (plain only, alcohol free) Saline nasal spray/drops Sudafed (pseudoephedrine) & Actifed ** use only after [redacted] weeks gestation and if you do not have high blood pressure Tylenol Vicks Vaporub Zinc lozenges Zyrtec   Constipation: Colace Ducolax suppositories Fleet enema Glycerin suppositories Metamucil Milk of magnesia Miralax Senokot Smooth move tea  Diarrhea: Kaopectate Imodium A-D  *NO pepto Bismol  Hemorrhoids: Anusol Anusol HC Preparation H Tucks  Indigestion: Tums Maalox Mylanta Zantac  Pepcid  Insomnia: Benadryl (alcohol free) 25mg  every 6 hours as needed Tylenol PM Unisom, no Gelcaps  Leg Cramps: Tums MagGel  Nausea/Vomiting:  Bonine Dramamine Emetrol Ginger extract Sea bands Meclizine  Nausea medication to take during pregnancy:  Unisom (doxylamine succinate 25 mg tablets) Take one tablet daily at bedtime. If symptoms are not adequately controlled, the dose can be increased to a maximum recommended dose of two tablets  daily (1/2 tablet in the morning, 1/2 tablet mid-afternoon and one at bedtime). Vitamin B6 100mg  tablets. Take one tablet twice a day (up to 200 mg per day).  Skin Rashes: Aveeno products Benadryl cream or 25mg  every 6 hours as needed Calamine Lotion 1% cortisone cream  Yeast infection: Gyne-lotrimin 7 Monistat  7   **If taking multiple medications, please check labels to avoid duplicating the same active ingredients **take medication as directed on the label ** Do not exceed 4000 mg of tylenol in 24 hours **Do not take medications that contain aspirin or ibuprofen     First Trimester of Pregnancy The first trimester of pregnancy is from week 1 until the end of week 13 (months 1 through 3). A week after a sperm fertilizes an egg, the egg will implant on the wall of the uterus. This embryo will begin to develop into a baby. Genes from you and your partner will form the baby. The female genes will determine whether the baby will be a boy or a girl. At 6-8 weeks, the eyes and face will be formed, and the heartbeat can be seen on ultrasound. At the end of 12 weeks, all the baby's organs will be formed. Now that you are pregnant, you will want to do everything you can to have a healthy baby. Two of the most important things are to get good prenatal care and to follow your health care provider's instructions. Prenatal care is all the medical care you receive before the baby's birth. This care will help prevent, find, and treat any problems during the pregnancy and childbirth. Body changes during your first trimester Your body goes through many changes during pregnancy. The changes vary from woman to woman.  You may gain or lose a couple of pounds at first.  You may feel sick to your stomach (nauseous) and you may throw up (vomit). If the vomiting is uncontrollable, call your health care provider.  You may tire easily.  You may develop headaches that can be relieved by medicines. All medicines should be approved by your health care provider.  You may urinate more often. Painful urination may mean you have a bladder infection.  You may develop heartburn as a result of your pregnancy.  You may develop constipation because certain hormones are causing the muscles that push stool through your intestines to  slow down.  You may develop hemorrhoids or swollen veins (varicose veins).  Your breasts may begin to grow larger and become tender. Your nipples may stick out more, and the tissue that surrounds them (areola) may become darker.  Your gums may bleed and may be sensitive to brushing and flossing.  Dark spots or blotches (chloasma, mask of pregnancy) may develop on your face. This will likely fade after the baby is born.  Your menstrual periods will stop.  You may have a loss of appetite.  You may develop cravings for certain kinds of food.  You may have changes in your emotions from day to day, such as being excited to be pregnant or being concerned that something may go wrong with the pregnancy and baby.  You may have more vivid and strange dreams.  You may have changes in your hair. These can include thickening of your hair, rapid growth, and changes in texture. Some women also have hair loss during or after pregnancy, or hair that feels dry or thin. Your hair will most likely return to normal after your baby is born. What to expect at  prenatal visits During a routine prenatal visit:  You will be weighed to make sure you and the baby are growing normally.  Your blood pressure will be taken.  Your abdomen will be measured to track your baby's growth.  The fetal heartbeat will be listened to between weeks 10 and 14 of your pregnancy.  Test results from any previous visits will be discussed. Your health care provider may ask you:  How you are feeling.  If you are feeling the baby move.  If you have had any abnormal symptoms, such as leaking fluid, bleeding, severe headaches, or abdominal cramping.  If you are using any tobacco products, including cigarettes, chewing tobacco, and electronic cigarettes.  If you have any questions. Other tests that may be performed during your first trimester include:  Blood tests to find your blood type and to check for the presence of any  previous infections. The tests will also be used to check for low iron levels (anemia) and protein on red blood cells (Rh antibodies). Depending on your risk factors, or if you previously had diabetes during pregnancy, you may have tests to check for high blood sugar that affects pregnant women (gestational diabetes).  Urine tests to check for infections, diabetes, or protein in the urine.  An ultrasound to confirm the proper growth and development of the baby.  Fetal screens for spinal cord problems (spina bifida) and Down syndrome.  HIV (human immunodeficiency virus) testing. Routine prenatal testing includes screening for HIV, unless you choose not to have this test.  You may need other tests to make sure you and the baby are doing well. Follow these instructions at home: Medicines  Follow your health care provider's instructions regarding medicine use. Specific medicines may be either safe or unsafe to take during pregnancy.  Take a prenatal vitamin that contains at least 600 micrograms (mcg) of folic acid.  If you develop constipation, try taking a stool softener if your health care provider approves. Eating and drinking   Eat a balanced diet that includes fresh fruits and vegetables, whole grains, good sources of protein such as meat, eggs, or tofu, and low-fat dairy. Your health care provider will help you determine the amount of weight gain that is right for you.  Avoid raw meat and uncooked cheese. These carry germs that can cause birth defects in the baby.  Eating four or five small meals rather than three large meals a day may help relieve nausea and vomiting. If you start to feel nauseous, eating a few soda crackers can be helpful. Drinking liquids between meals, instead of during meals, also seems to help ease nausea and vomiting.  Limit foods that are high in fat and processed sugars, such as fried and sweet foods.  To prevent constipation: ? Eat foods that are high in  fiber, such as fresh fruits and vegetables, whole grains, and beans. ? Drink enough fluid to keep your urine clear or pale yellow. Activity  Exercise only as directed by your health care provider. Most women can continue their usual exercise routine during pregnancy. Try to exercise for 30 minutes at least 5 days a week. Exercising will help you: ? Control your weight. ? Stay in shape. ? Be prepared for labor and delivery.  Experiencing pain or cramping in the lower abdomen or lower back is a good sign that you should stop exercising. Check with your health care provider before continuing with normal exercises.  Try to avoid standing for long periods of  time. Move your legs often if you must stand in one place for a long time.  Avoid heavy lifting.  Wear low-heeled shoes and practice good posture.  You may continue to have sex unless your health care provider tells you not to. Relieving pain and discomfort  Wear a good support bra to relieve breast tenderness.  Take warm sitz baths to soothe any pain or discomfort caused by hemorrhoids. Use hemorrhoid cream if your health care provider approves.  Rest with your legs elevated if you have leg cramps or low back pain.  If you develop varicose veins in your legs, wear support hose. Elevate your feet for 15 minutes, 3-4 times a day. Limit salt in your diet. Prenatal care  Schedule your prenatal visits by the twelfth week of pregnancy. They are usually scheduled monthly at first, then more often in the last 2 months before delivery.  Write down your questions. Take them to your prenatal visits.  Keep all your prenatal visits as told by your health care provider. This is important. Safety  Wear your seat belt at all times when driving.  Make a list of emergency phone numbers, including numbers for family, friends, the hospital, and police and fire departments. General instructions  Ask your health care provider for a referral to a  local prenatal education class. Begin classes no later than the beginning of month 6 of your pregnancy.  Ask for help if you have counseling or nutritional needs during pregnancy. Your health care provider can offer advice or refer you to specialists for help with various needs.  Do not use hot tubs, steam rooms, or saunas.  Do not douche or use tampons or scented sanitary pads.  Do not cross your legs for long periods of time.  Avoid cat litter boxes and soil used by cats. These carry germs that can cause birth defects in the baby and possibly loss of the fetus by miscarriage or stillbirth.  Avoid all smoking, herbs, alcohol, and medicines not prescribed by your health care provider. Chemicals in these products affect the formation and growth of the baby.  Do not use any products that contain nicotine or tobacco, such as cigarettes and e-cigarettes. If you need help quitting, ask your health care provider. You may receive counseling support and other resources to help you quit.  Schedule a dentist appointment. At home, brush your teeth with a soft toothbrush and be gentle when you floss. Contact a health care provider if:  You have dizziness.  You have mild pelvic cramps, pelvic pressure, or nagging pain in the abdominal area.  You have persistent nausea, vomiting, or diarrhea.  You have a bad smelling vaginal discharge.  You have pain when you urinate.  You notice increased swelling in your face, hands, legs, or ankles.  You are exposed to fifth disease or chickenpox.  You are exposed to [place] measles (rubella) and have never had it. Get help right away if:  You have a fever.  You are leaking fluid from your vagina.  You have spotting or bleeding from your vagina.  You have severe abdominal cramping or pain.  You have rapid weight gain or loss.  You vomit blood or material that looks like coffee grounds.  You develop a severe headache.  You have shortness of  breath.  You have any kind of trauma, such as from a fall or a car accident. Summary  The first trimester of pregnancy is from week 1 until the end of  week 13 (months 1 through 3).  Your body goes through many changes during pregnancy. The changes vary from woman to woman.  You will have routine prenatal visits. During those visits, your health care provider will examine you, discuss any test results you may have, and talk with you about how you are feeling. This information is not intended to replace advice given to you by your health care provider. Make sure you discuss any questions you have with your health care provider. Document Revised: 01/06/2017 Document Reviewed: 01/06/2016 Elsevier Patient Education  2020 [employer].  Vaginal Bleeding During Pregnancy, First Trimester  A small amount of bleeding from the vagina (spotting) is relatively common during early pregnancy. It usually stops on its own. Various things may cause bleeding or spotting during early pregnancy. Some bleeding may be related to the pregnancy, and some may not. In many cases, the bleeding is normal and is not a problem. However, bleeding can also be a sign of something serious. Be sure to tell your health care provider about any vaginal bleeding right away. Some possible causes of vaginal bleeding during the first trimester include:  Infection or inflammation of the cervix.  Growths (polyps) on the cervix.  Miscarriage or threatened miscarriage.  Pregnancy tissue developing outside of the uterus (ectopic pregnancy).  A mass of tissue developing in the uterus due to an egg being fertilized incorrectly (molar pregnancy). Follow these instructions at home: Activity  Follow instructions from your health care provider about limiting your activity. Ask what activities are safe for you.  If needed, make plans for someone to help with your regular activities.  Do not have sex or orgasms until your health care  provider says that this is safe. General instructions  Take over-the-counter and prescription medicines only as told by your health care provider.  Pay attention to any changes in your symptoms.  Do not use tampons or douche.  Write down how many pads you use each day, how often you change pads, and how soaked (saturated) they are.  If you pass any tissue from your vagina, save the tissue so you can show it to your health care provider.  Keep all follow-up visits as told by your health care provider. This is important. Contact a health care provider if:  You have vaginal bleeding during any part of your pregnancy.  You have cramps or labor pains.  You have a fever. Get help right away if:  You have severe cramps in your back or abdomen.  You pass large clots or a large amount of tissue from your vagina.  Your bleeding increases.  You feel light-headed or weak, or you faint.  You have chills.  You are leaking fluid or have a gush of fluid from your vagina. Summary  A small amount of bleeding (spotting) from the vagina is relatively common during early pregnancy.  Various things may cause bleeding or spotting in early pregnancy.  Be sure to tell your health care provider about any vaginal bleeding right away. This information is not intended to replace advice given to you by your health care provider. Make sure you discuss any questions you have with your health care provider. Document Revised: 05/15/2018 Document Reviewed: 04/28/2016 Elsevier Patient Education  [place] Hematoma  A subchorionic hematoma is a gathering of blood between the outer wall of the embryo (chorion) and the inner wall of the womb (uterus). This condition can cause vaginal bleeding. If they cause little or  no vaginal bleeding, early small hematomas usually shrink on their own and do not affect your baby or pregnancy. When bleeding starts later in pregnancy, or if the  hematoma is larger or occurs in older pregnant women, the condition may be more serious. Larger hematomas may get bigger, which increases the chances of miscarriage. This condition also increases the risk of:  Premature separation of the placenta from the uterus.  Premature (preterm) labor.  Stillbirth. What are the causes? The exact cause of this condition is not known. It occurs when blood is trapped between the placenta and the uterine wall because the placenta has separated from the original site of implantation. What increases the risk? You are more likely to develop this condition if:  You were treated with fertility medicines.  You conceived through in vitro fertilization (IVF). What are the signs or symptoms? Symptoms of this condition include:  Vaginal spotting or bleeding.  Contractions of the uterus. These cause abdominal pain. Sometimes you may have no symptoms and the bleeding may only be seen when ultrasound images are taken (transvaginal ultrasound). How is this diagnosed? This condition is diagnosed based on a physical exam. This includes a pelvic exam. You may also have other tests, including:  Blood tests.  Urine tests.  Ultrasound of the abdomen. How is this treated? Treatment for this condition can vary. Treatment may include:  Watchful waiting. You will be monitored closely for any changes in bleeding. During this stage: ? The hematoma may be reabsorbed by the body. ? The hematoma may separate the fluid-filled space containing the embryo (gestational sac) from the wall of the womb (endometrium).  Medicines.  Activity restriction. This may be needed until the bleeding stops. Follow these instructions at home:  Stay on bed rest if told to do so by your health care provider.  Do not lift anything that is heavier than 10 lbs. (4.5 kg) or as told by your health care provider.  Do not use any products that contain nicotine or tobacco, such as cigarettes  and e-cigarettes. If you need help quitting, ask your health care provider.  Track and write down the number of pads you use each day and how soaked (saturated) they are.  Do not use tampons.  Keep all follow-up visits as told by your health care provider. This is important. Your health care provider may ask you to have follow-up blood tests or ultrasound tests or both. Contact a health care provider if:  You have any vaginal bleeding.  You have a fever. Get help right away if:  You have severe cramps in your stomach, back, abdomen, or pelvis.  You pass large clots or tissue. Save any tissue for your health care provider to look at.  You have more vaginal bleeding, and you faint or become lightheaded or weak. Summary  A subchorionic hematoma is a gathering of blood between the outer wall of the placenta and the uterus.  This condition can cause vaginal bleeding.  Sometimes you may have no symptoms and the bleeding may only be seen when ultrasound images are taken.  Treatment may include watchful waiting, medicines, or activity restriction. This information is not intended to replace advice given to you by your health care provider. Make sure you discuss any questions you have with your health care provider. Document Revised: 01/06/2017 Document Reviewed: 03/22/2016 Elsevier Patient Education  2020 [employer].

## 2019-04-13 NOTE — [facility] Note (Addendum)
Brittney [name] is a 29 y.o. at [facility] here in [facility] reporting:  +vaginal bleeding. Spotting. Has not seen anything today.  Taken 2 +HPT 2days ago. States she has an ectopic pregnancy before. LMP: patient unsure.  "I had 2 periods in December. The beginning and the end. A brand new one Jan 25th. Not one at all in February"  Onset of complaint: yesterday  +lower right abdominal pain. Sharp. Intermittent. Pain score: 3/10 Vitals:   04/13/19 1705  BP: 125/75  Pulse: 87  Resp: 18  Temp: 98.6 F (37 C)  SpO2: 100%     Lab orders placed from triage: [facility]

## 2019-04-13 NOTE — MAU Provider Note (Signed)
History     CSN: [id number]  Arrival date and time: 04/13/19 1656   First Provider Initiated Contact with Patient 04/13/19 1832      No chief complaint on file.  [name] is a 29 y.o. [id number] at Unknown who presents to MAU for vaginal bleeding which began 2 days. Pt describes the bleeding as spotting when wipiing, but denies any bleeding today  Passing blood clots? no Blood soaking clothes? no Lightheaded/dizzy? no Significant pelvic pain or cramping? Minimal cramping Passed any tissue? no Hx ectopic pregnancy? Yes, had MTX Hx of PID, GYN surgery? no  Current pregnancy problems? Pt has not yet been seen Blood Type? O Positive Allergies? AMOX Current medications? none Current PNC & next appt? [facility], NOB 04/23/2019  Pt denies vaginal discharge/odor/itching. Pt denies N/V, abdominal pain, constipation, diarrhea, or urinary problems. Pt denies fever, chills, fatigue, sweating or changes in appetite. Pt denies SOB or chest pain. Pt denies dizziness, HA, light-headedness, weakness.   OB History    Gravida  6   Para  3   Term  1   Preterm  2   AB  2   Living  3     SAB  1   TAB      Ectopic  1   Multiple  0   Live Births  3           Past Medical History:  Diagnosis Date  . Headache   . Preterm labor     Past Surgical History:  Procedure Laterality Date  . NO PAST SURGERIES      Family History  Problem Relation Age of Onset  . Diabetes Other   . Hypertension Mother   . Hypertension Father   . Epilepsy Sister   . Epilepsy Brother   . Diabetes Maternal Aunt     Social History   Tobacco Use  . Smoking status: Never Smoker  . Smokeless tobacco: Never Used  Substance Use Topics  . Alcohol use: No    Alcohol/week: 0.0 standard drinks  . Drug use: No    Allergies:  Allergies  Allergen Reactions  . Amoxicillin Hives    Medications Prior to Admission  Medication Sig Dispense Refill Last Dose  . clindamycin  (CLEOCIN) 300 MG capsule Take 1 capsule (300 mg total) by mouth 3 (three) times daily. 30 capsule 0   . ferrous sulfate 325 (65 FE) MG tablet Take 1 tablet (325 mg total) by mouth 2 (two) times daily with a meal. (Patient taking differently: Take 325 mg by mouth daily with breakfast. ) 60 tablet 5   . ibuprofen (ADVIL,MOTRIN) 600 MG tablet Take 1 tablet (600 mg total) by mouth every 6 (six) hours as needed. 30 tablet 0   . Prenat-Fe Poly-Methfol-FA-DHA (VITAFOL ULTRA) 29-0.6-0.4-200 MG CAPS Take 1 capsule by mouth daily before breakfast. 90 capsule 4     Review of Systems  Constitutional: Negative for chills, diaphoresis, fatigue and fever.  Eyes: Negative for visual disturbance.  Respiratory: Negative for shortness of breath.   Cardiovascular: Negative for chest pain.  Gastrointestinal: Negative for abdominal pain, constipation, diarrhea, nausea and vomiting.  Genitourinary: Positive for pelvic pain and vaginal bleeding. Negative for dysuria, flank pain, frequency, urgency and vaginal discharge.  Neurological: Negative for dizziness, weakness, light-headedness and headaches.   Physical Exam   Blood pressure 125/75, pulse 87, temperature 98.6 F (37 C), temperature source Oral, resp. rate 18, weight 72.4 kg, SpO2 100 %, not currently  breastfeeding.  Patient Vitals for the past 24 hrs:  BP Temp Temp src Pulse Resp SpO2 Weight  04/13/19 1705 125/75 98.6 F (37 C) Oral 87 18 100 % 72.4 kg   Physical Exam  Constitutional: She is oriented to person, place, and time. She appears well-developed and well-nourished. No distress.  HENT:  Head: Normocephalic and atraumatic.  Respiratory: Effort normal.  GI: Soft. She exhibits no distension and no mass. There is no abdominal tenderness. There is no rebound and no guarding.  Neurological: She is alert and oriented to person, place, and time.  Skin: Skin is warm and dry. She is not diaphoretic.  Psychiatric: She has a normal mood and affect. Her  behavior is normal. Judgment and thought content normal.   Results for orders placed or performed during the hospital encounter of 04/13/19 (from the past 24 hour(s))  Urinalysis, Routine w reflex microscopic     Status: Abnormal   Collection Time: 04/13/19  5:08 PM  Result Value Ref Range   Color, Urine STRAW (A) YELLOW   APPearance CLEAR CLEAR   Specific Gravity, Urine 1.009 1.005 - 1.030   pH 8.0 5.0 - 8.0   Glucose, UA NEGATIVE NEGATIVE mg/dL   Hgb urine dipstick NEGATIVE NEGATIVE   Bilirubin Urine NEGATIVE NEGATIVE   Ketones, ur NEGATIVE NEGATIVE mg/dL   Protein, ur NEGATIVE NEGATIVE mg/dL   Nitrite NEGATIVE NEGATIVE   Leukocytes,Ua NEGATIVE NEGATIVE  Pregnancy, urine     Status: Abnormal   Collection Time: 04/13/19  5:19 PM  Result Value Ref Range   Preg Test, Ur POSITIVE (A) NEGATIVE  CBC     Status: None   Collection Time: 04/13/19  5:48 PM  Result Value Ref Range   WBC 9.7 4.0 - 10.5 K/uL   RBC 4.05 3.87 - 5.11 MIL/uL   Hemoglobin 12.2 12.0 - 15.0 g/dL   HCT 77.9 39.0 - 30.0 %   MCV 90.4 80.0 - 100.0 fL   MCH 30.1 26.0 - 34.0 pg   MCHC 33.3 30.0 - 36.0 g/dL   RDW 92.3 30.0 - 76.2 %   Platelets 333 150 - 400 K/uL   nRBC 0.0 0.0 - 0.2 %  Comprehensive metabolic panel     Status: Abnormal   Collection Time: 04/13/19  5:48 PM  Result Value Ref Range   Sodium 136 135 - 145 mmol/L   Potassium 4.1 3.5 - 5.1 mmol/L   Chloride 105 98 - 111 mmol/L   CO2 23 22 - 32 mmol/L   Glucose, Bld 101 (H) 70 - 99 mg/dL   BUN 8 6 - 20 mg/dL   Creatinine, Ser 2.63 0.44 - 1.00 mg/dL   Calcium 9.5 8.9 - 33.5 mg/dL   Total Protein 7.1 6.5 - 8.1 g/dL   Albumin 3.6 3.5 - 5.0 g/dL   AST 11 (L) 15 - 41 U/L   ALT 10 0 - 44 U/L   Alkaline Phosphatase 39 38 - 126 U/L   Total Bilirubin 0.9 0.3 - 1.2 mg/dL   GFR calc non Af Amer >60 >60 mL/min   GFR calc Af Amer >60 >60 mL/min   Anion gap 8 5 - 15  hCG, quantitative, pregnancy     Status: Abnormal   Collection Time: 04/13/19  5:48 PM   Result Value Ref Range   hCG, Beta Chain, Quant, S 51,581 (H) <5 mIU/mL  Wet prep, genital     Status: Abnormal   Collection Time: 04/13/19  6:06 PM  Result  Value Ref Range   Yeast Wet Prep HPF POC NONE SEEN NONE SEEN   Trich, Wet Prep NONE SEEN NONE SEEN   Clue Cells Wet Prep HPF POC NONE SEEN NONE SEEN   WBC, Wet Prep HPF POC FEW (A) NONE SEEN   Sperm NONE SEEN    US OB LESS THAN 14 WEEKS WITH OB TRANSVAGINAL  Result Date: 04/13/2019 CLINICAL DATA:  Pelvic pain and vaginal bleeding in 1st trimester pregnancy. EXAM: OBSTETRIC <14 WK [place] AND TRANSVAGINAL OB US TECHNIQUE: Both transabdominal and transvaginal ultrasound examinations were performed for complete evaluation of the gestation as well as the maternal uterus, adnexal regions, and pelvic cul-de-sac. Transvaginal technique was performed to assess early pregnancy. COMPARISON:  None. FINDINGS: Intrauterine gestational sac: Single Yolk sac:  Visualized. Embryo:  Visualized. Cardiac Activity: Visualized. Heart Rate: 117 bpm CRL:  4 mm   6 w   0 d                  [place] EDC: 12/07/2019 Subchorionic hemorrhage:  None visualized. Maternal uterus/adnexae: Retroverted uterus. Normal appearance of both ovaries. Prominent left adnexal vessels noted, however no adnexal mass or abnormal free fluid identified. IMPRESSION: Single living IUP with estimated gestational age of [redacted] weeks 0 days, and [place] EDC of 12/07/2019. No maternal uterine or adnexal abnormality identified. Electronically Signed   By: Marlaine Hind M.D.   On: 04/13/2019 18:48    MAU Course  Procedures  MDM -r/o ectopic -UA: straw, otherwise WNL -CBC: WNL -CMP: WNL -[place]: single IUP, [redacted]w[redacted]d, [place] -hCG: 51,581 -ABO: O Positive -WetPrep: WNL -GC/CT collected -pt discharged to home in stable condition  Orders Placed This Encounter  Procedures  . Wet prep, genital    Standing Status:   Standing    Number of Occurrences:   1  . US OB LESS THAN 14 WEEKS WITH OB TRANSVAGINAL    Standing  Status:   Standing    Number of Occurrences:   1    Order Specific Question:   Symptom/Reason for Exam    Answer:   Vaginal bleeding in pregnancy [[id number]]  . Urinalysis, Routine w reflex microscopic    Standing Status:   Standing    Number of Occurrences:   1  . Pregnancy, urine    Standing Status:   Standing    Number of Occurrences:   1  . CBC    Standing Status:   Standing    Number of Occurrences:   1  . Comprehensive metabolic panel    Standing Status:   Standing    Number of Occurrences:   1  . hCG, quantitative, pregnancy    Standing Status:   Standing    Number of Occurrences:   1  . Discharge patient    Order Specific Question:   Discharge disposition    Answer:   01-Home or Self Care [1]    Order Specific Question:   Discharge patient date    Answer:   04/13/2019   No orders of the defined types were placed in this encounter.  Assessment and Plan   1. Subchorionic hemorrhage of placenta in first trimester, single or unspecified fetus   2. Vaginal bleeding in pregnancy   3. [redacted] weeks gestation of pregnancy   4. Blood type, Rh positive    Allergies as of 04/13/2019      Reactions   Amoxicillin Hives      Medication List    STOP taking these medications  ibuprofen 600 MG tablet Commonly known as: ADVIL     TAKE these medications   clindamycin 300 MG capsule Commonly known as: CLEOCIN Take 1 capsule (300 mg total) by mouth 3 (three) times daily.   ferrous sulfate 325 (65 FE) MG tablet Take 1 tablet (325 mg total) by mouth 2 (two) times daily with a meal. What changed: when to take this   Vitafol Ultra 29-0.6-0.4-200 MG Caps Take 1 capsule by mouth daily before breakfast.      -will call with culture results, if positive -discussed expected s/sx of [facility] vs. SAB -pt advised to return to MAU with worsening bleeding/pain -pt advised if she chooses to miscarry at home needs to f/u with OB/GYN to follow to resolution -return MAU precautions given -pt  discharged to home in stable condition  Joni Reining E Latrena Benegas 04/13/2019, 7:13 PM

## 2019-04-15 LAB — GC/CHLAMYDIA PROBE AMP (~~LOC~~) NOT AT ARMC
Chlamydia: NEGATIVE
Comment: NEGATIVE
Comment: NORMAL
Neisseria Gonorrhea: NEGATIVE

## 2019-05-09 ENCOUNTER — Other Ambulatory Visit: Payer: Self-pay

## 2019-05-09 ENCOUNTER — Encounter: Payer: Self-pay | Admitting: Family Medicine

## 2019-05-09 ENCOUNTER — Ambulatory Visit (INDEPENDENT_AMBULATORY_CARE_PROVIDER_SITE_OTHER): Payer: Medicaid Other | Admitting: Family Medicine

## 2019-05-09 VITALS — BP 98/64 | HR 97 | Wt 158.6 lb

## 2019-05-09 DIAGNOSIS — Z3481 Encounter for supervision of other normal pregnancy, first trimester: Secondary | ICD-10-CM

## 2019-05-09 DIAGNOSIS — O0990 Supervision of high risk pregnancy, unspecified, unspecified trimester: Secondary | ICD-10-CM | POA: Insufficient documentation

## 2019-05-09 DIAGNOSIS — O0991 Supervision of high risk pregnancy, unspecified, first trimester: Secondary | ICD-10-CM

## 2019-05-09 LAB — POCT URINALYSIS DIP (MANUAL ENTRY)
Bilirubin, UA: NEGATIVE
Blood, UA: NEGATIVE
Glucose, UA: NEGATIVE mg/dL
Leukocytes, UA: NEGATIVE
Nitrite, UA: NEGATIVE
Protein Ur, POC: NEGATIVE mg/dL
Spec Grav, UA: >=1.03 — AB (ref 1.010–1.025)
Urobilinogen, UA: 0.2 E.U./dL
pH, UA: 6.5 (ref 5.0–8.0)

## 2019-05-09 NOTE — Progress Notes (Addendum)
Patient Name: Brittney [name] Date of Birth: [birth year] [place] Initial Prenatal Visit  Brittney [name] is a 29 y.o. year old [id number] at [redacted]w[redacted]d who presents for her initial prenatal visit. Pregnancy is planned She reports home pregnancy test. She is taking a prenatal vitamin.  She denies pelvic pain or vaginal bleeding.   Pregnancy Dating: . The patient is dated by LMP.  . LMP: 03/04/2019 . Period is certain:  Yes.  . Periods were regular:  Yes.  Marland Kitchen LMP was a typical period:  Yes.  . Using hormonal contraception in 3 months prior to conception: No  Lab Review: Blood type: Conflict (See Lab Report): O POS/O POS Performed at [facility], [address], [place], [place] [address] . Newborn labs not previously drawn.  Ordered today  PMH: Reviewed and as detailed below: . HTN: No  . Type 1 or 2 Diabetes: No  Gestational diabetes with previous pregnancies but no medications. . Depression:  No  . Seizure disorder:  No . VTE: No ,  . History of STI No,  . Abnormal Pap smear:  No, . Genital herpes simplex:  No   PSH: . Gynecologic Surgery:  no . Surgical history reviewed, notable for: none  Obstetric History: . Obstetric history tab updated and reviewed.  . Summary of prior pregnancies: updated . Cesarean delivery: No  . Gestational Diabetes:  Yes . Hypertension in pregnancy: No . History of preterm birth: Yes . History of LGA/SGA infant:  Yes . History of shoulder dystocia: No . Indications for referral were reviewed, and the patient has no obstetric indications for referral to [facility] at this time.   Social History: . Partner's name: Maye Hides Junior  . Tobacco use: No . Alcohol use:  No . Other substance use:  No  Current Medications:  . none  . Reviewed and appropriate in pregnancy.   Genetic and Infection Screen: . Flow Sheet Updated Yes  Prenatal Exam: Gen: Well nourished, well developed.  No distress.  Vitals  noted. HEENT: Normocephalic, atraumatic.  Neck supple without cervical lymphadenopathy, thyromegaly or thyroid nodules.  Fair dentition. CV: RRR no murmur, gallops or rubs Lungs: CTA B.  Normal respiratory effort without wheezes or rales. Abd: soft, NTND. +BS.  Uterus not appreciated above pelvis. Ext: No clubbing, cyanosis or edema. Psych: Normal grooming and dress.  Not depressed or anxious appearing.  Normal thought content and process without flight of ideas or looseness of associations  Assessment/Plan:  Brittney [name] is a 29 y.o. [id number] at [redacted]w[redacted]d who presents to initiate prenatal care. She is doing well.  Current pregnancy issues include high risk pregnancy due to 2 previous premature deliveries.  1. Routine prenatal care: Marland Kitchen As dating is reliable, a dating ultrasound has been performed. . Pre-pregnancy weight updated. Expected weight gain this pregnancy is 25-35 pounds  . Prenatal labs not yet drawn, taken today. . Indications for referral to [facility] were reviewed and the patient does meet criteria for referral due to 2 previous premature deliveries. . Medication list reviewed and updated.  . Recommended patient see a dentist for regular care.  . Bleeding and pain precautions reviewed. . Importance of prenatal vitamins reviewed.  . Genetic screening offered. Patient opted for: first trimester screen with nuchal translucency at 11-13 weeks. . The patient will not be age 70 or over at time of delivery. Referral to genetic counseling was offered today due to the father's known medical history of sickle  cell trait.  Was ultimately declined due to previous experience with genetic counseling with previous 2 children from the same father.  . The patient has the following risk factors for preexisting diabetes: BMI >25 and history of GDM . An early 1 hour glucose tolerance test was ordered. . No pelvic exam was performed during this visit.

## 2019-05-09 NOTE — Patient Instructions (Signed)
It was so nice to meet you today [name].  Unfortunately, it looks like you would be better served by an obstetric specialist.  I have put in a referral for you to see a high risk obstetrician.  This is mainly because of your 2 preterm deliveries which puts you at high risk of another preterm delivery.  I think that this is important so that you get the best care for her babies.  You should get a call in the next week or 2 to schedule an appointment with his new OB.  Please call the clinic if you have not heard back in the next week or 2.  Please start taking your prenatal vitamins daily.  I will let you know if there are any abnormalities in your lab work from today.  We were not able to schedule an appointment for genetic screening today.  If you have not gotten a call from your new OB provider in the next week or 2, please let us know so that we can make sure you are getting that genetic screening at the appropriate time.

## 2019-05-11 LAB — URINE CULTURE, OB REFLEX

## 2019-05-11 LAB — CULTURE, OB URINE

## 2019-05-14 LAB — OBSTETRIC PANEL, INCLUDING HIV
Antibody Screen: NEGATIVE
Basophils Absolute: 0.1 10*3/uL (ref 0.0–0.2)
Basos: 0 %
EOS (ABSOLUTE): 0.1 10*3/uL (ref 0.0–0.4)
Eos: 1 %
HIV Screen 4th Generation wRfx: NONREACTIVE
Hematocrit: 39.6 % (ref 34.0–46.6)
Hemoglobin: 13.4 g/dL (ref 11.1–15.9)
Hepatitis B Surface Ag: NEGATIVE
Immature Grans (Abs): 0 10*3/uL (ref 0.0–0.1)
Immature Granulocytes: 0 %
Lymphocytes Absolute: 3.5 10*3/uL — ABNORMAL HIGH (ref 0.7–3.1)
Lymphs: 29 %
MCH: 30.9 pg (ref 26.6–33.0)
MCHC: 33.8 g/dL (ref 31.5–35.7)
MCV: 92 fL (ref 79–97)
Monocytes Absolute: 0.5 10*3/uL (ref 0.1–0.9)
Monocytes: 4 %
Neutrophils Absolute: 7.9 10*3/uL — ABNORMAL HIGH (ref 1.4–7.0)
Neutrophils: 66 %
Platelets: 388 10*3/uL (ref 150–450)
RBC: 4.33 x10E6/uL (ref 3.77–5.28)
RDW: 13.4 % (ref 11.7–15.4)
RPR Ser Ql: NONREACTIVE
Rh Factor: POSITIVE
Rubella Antibodies, IGG: 3.47 index (ref >0.99)
WBC: 12.1 10*3/uL — ABNORMAL HIGH (ref 3.4–10.8)

## 2019-05-14 LAB — HGB FRACTIONATION CASCADE
Hgb A2: 3 % (ref 1.8–3.2)
Hgb A: 97 % (ref 96.4–98.8)
Hgb F: 0 % (ref 0.0–2.0)
Hgb S: 0 %

## 2019-05-21 ENCOUNTER — Ambulatory Visit (INDEPENDENT_AMBULATORY_CARE_PROVIDER_SITE_OTHER): Payer: Medicaid Other | Admitting: Family Medicine

## 2019-05-21 ENCOUNTER — Other Ambulatory Visit: Payer: Self-pay

## 2019-05-21 VITALS — BP 98/58 | HR 96 | Ht 63.0 in | Wt 159.5 lb

## 2019-05-21 DIAGNOSIS — B373 Candidiasis of vulva and vagina: Secondary | ICD-10-CM | POA: Diagnosis not present

## 2019-05-21 DIAGNOSIS — B3731 Acute candidiasis of vulva and vagina: Secondary | ICD-10-CM

## 2019-05-21 DIAGNOSIS — N898 Other specified noninflammatory disorders of vagina: Secondary | ICD-10-CM | POA: Diagnosis not present

## 2019-05-21 LAB — POCT URINALYSIS DIP (MANUAL ENTRY)
Bilirubin, UA: NEGATIVE
Glucose, UA: NEGATIVE mg/dL
Ketones, POC UA: NEGATIVE mg/dL
Nitrite, UA: NEGATIVE
Protein Ur, POC: NEGATIVE mg/dL
Spec Grav, UA: >=1.03 — AB (ref 1.010–1.025)
Urobilinogen, UA: 0.2 E.U./dL
pH, UA: 6 (ref 5.0–8.0)

## 2019-05-21 LAB — POCT WET PREP (WET MOUNT)
Clue Cells Wet Prep Whiff POC: NEGATIVE
Trichomonas Wet Prep HPF POC: ABSENT

## 2019-05-21 LAB — POCT UA - MICROSCOPIC ONLY: Epithelial cells, urine per micros: >20

## 2019-05-21 MED ORDER — CLOTRIMAZOLE 1 % VA CREA: 1.0000 | TOPICAL_CREAM | Freq: Every day | VAGINAL | 0 refills | Status: DC

## 2019-05-21 NOTE — Patient Instructions (Signed)
Thank you for coming to see me today. It was a pleasure! Today we talked about:   You have a yeast infection on your wet prep.  I recommend that you use the clotrimazole vaginal cream daily for the next 7 days.  If your symptoms persist after this and do not hesitate to return to the office if you have not first been seen by her OB/GYN.  If you have any cramping/contractions, vaginal bleeding, or fluid leaking, go immediately to [facility] to be evaluated.  Please follow-up as needed.  If you have any questions or concerns, please do not hesitate to call the office at [phone].  Take Care,   [place] Ruthetta Koopmann, DO

## 2019-05-21 NOTE — Progress Notes (Signed)
   SUBJECTIVE:   CHIEF COMPLAINT / HPI:   Vaginal discharge: Patient currently [redacted] weeks pregnant and reporting symptoms of yeast infection. She denies any new partners and does not wish to have any STD testing at today's visit. She states she is having cottage cheese discharge along with itching. She denies any abdominal pain. She denies any vaginal bleeding, loss of fluid. She is establishing care at her OB/GYN given her high risk pregnancy.   PERTINENT  PMH / PSH: [redacted] weeks pregnant  OBJECTIVE:  BP (!) 98/58   Pulse 96   Ht 5\' 3"  (1.6 m)   Wt 159 lb 8 oz (72.3 kg)   LMP 03/04/2019   SpO2 99%   BMI 28.25 kg/m   General: NAD, pleasant Neck: Supple Respiratory: normal work of breathing Neuro: CN II-XII grossly intact Psych: AOx3, appropriate affect GU/GYN: External genitalia within normal limits. Vaginal mucosa pink, moist, normal rugae.  White discharge, no bleeding noted on speculum exam.  Exam performed in the presence of a chaperone.   ASSESSMENT/PLAN:   Vaginal discharge Yeast on wet prep and sx c/w yeast infection. Refused STI testing today. Will trial clotrimazole vaginal cream x7 days. If patient's symptoms persist then she is to return for follow-up. She is currently establishing care with her OB/GYN for this pregancy.     03/06/2019 Reise Hietala, DO PGY-3, [place] [facility]

## 2019-05-22 NOTE — Assessment & Plan Note (Signed)
Yeast on wet prep and sx c/w yeast infection. Refused STI testing today. Will trial clotrimazole vaginal cream x7 days. If patient's symptoms persist then she is to return for follow-up. She is currently establishing care with her OB/GYN for this pregancy.

## 2019-05-30 ENCOUNTER — Ambulatory Visit: Payer: Medicaid Other

## 2019-05-30 DIAGNOSIS — Z3490 Encounter for supervision of normal pregnancy, unspecified, unspecified trimester: Secondary | ICD-10-CM | POA: Insufficient documentation

## 2019-05-30 MED ORDER — BLOOD PRESSURE KIT: 1.0000 | PACK | Freq: Every day | 0 refills | Status: DC

## 2019-05-30 NOTE — Progress Notes (Signed)
..    Virtual Visit via Telephone Note  I connected with Otho Najjar on 05/30/19 at  2:00 PM EDT by telephone and verified that I am speaking with the correct person using two identifiers.   I discussed the limitations, risks, security and privacy concerns of performing an evaluation and management service by telephone and the availability of in person appointments. I also discussed with the patient that there may be a patient responsible charge related to this service. The patient expressed understanding and agreed to proceed.   History of Present Illness: PRENATAL INTAKE SUMMARY  Ms. Gupta presents today New OB Nurse Interview.  OB History    Gravida  7   Para  3   Term  1   Preterm  2   AB  3   Living  3     SAB  2   TAB      Ectopic  1   Multiple  0   Live Births  3          I have reviewed the patient's medical, obstetrical, social, and family histories, medications, and available lab results.  SUBJECTIVE She has no unusual complaints   Observations/Objective: Initial nurse interview for history/labs (New OB)  EDD: 12/09/19 GA: [redacted]w[redacted]d GP   Follow Up Instructions:   I discussed the assessment and treatment plan with the patient. The patient was provided an opportunity to ask questions and all were answered. The patient agreed with the plan and demonstrated an understanding of the instructions.   The patient was advised to call back or seek an in-person evaluation if the symptoms worsen or if the condition fails to improve as anticipated.  I provided 10 minutes of non-face-to-face time during this encounter.   Hamilton Capri, CMA

## 2019-05-31 DIAGNOSIS — Z3490 Encounter for supervision of normal pregnancy, unspecified, unspecified trimester: Secondary | ICD-10-CM | POA: Diagnosis not present

## 2019-06-07 ENCOUNTER — Other Ambulatory Visit (HOSPITAL_COMMUNITY)
Admission: RE | Admit: 2019-06-07 | Discharge: 2019-06-07 | Disposition: A | Discharge status: Home / Self Care | Payer: Medicaid Other | Source: Ambulatory Visit | Attending: Obstetrics | Admitting: Obstetrics

## 2019-06-07 ENCOUNTER — Other Ambulatory Visit: Payer: Self-pay

## 2019-06-07 ENCOUNTER — Encounter: Payer: Self-pay | Admitting: Obstetrics

## 2019-06-07 ENCOUNTER — Ambulatory Visit (INDEPENDENT_AMBULATORY_CARE_PROVIDER_SITE_OTHER): Payer: Medicaid Other | Admitting: Obstetrics

## 2019-06-07 VITALS — BP 106/68 | HR 81 | Wt 164.0 lb

## 2019-06-07 DIAGNOSIS — O99011 Anemia complicating pregnancy, first trimester: Secondary | ICD-10-CM

## 2019-06-07 DIAGNOSIS — O99019 Anemia complicating pregnancy, unspecified trimester: Secondary | ICD-10-CM

## 2019-06-07 DIAGNOSIS — Z315 Encounter for genetic counseling: Secondary | ICD-10-CM | POA: Diagnosis not present

## 2019-06-07 DIAGNOSIS — Z3490 Encounter for supervision of normal pregnancy, unspecified, unspecified trimester: Secondary | ICD-10-CM | POA: Diagnosis not present

## 2019-06-07 DIAGNOSIS — Z8632 Personal history of gestational diabetes: Secondary | ICD-10-CM

## 2019-06-07 DIAGNOSIS — Z3A13 13 weeks gestation of pregnancy: Secondary | ICD-10-CM

## 2019-06-07 DIAGNOSIS — Z3481 Encounter for supervision of other normal pregnancy, first trimester: Secondary | ICD-10-CM | POA: Diagnosis not present

## 2019-06-07 DIAGNOSIS — Z302 Encounter for sterilization: Secondary | ICD-10-CM

## 2019-06-07 DIAGNOSIS — O09219 Supervision of pregnancy with history of pre-term labor, unspecified trimester: Secondary | ICD-10-CM

## 2019-06-07 DIAGNOSIS — Z3480 Encounter for supervision of other normal pregnancy, unspecified trimester: Secondary | ICD-10-CM | POA: Diagnosis not present

## 2019-06-07 DIAGNOSIS — J301 Allergic rhinitis due to pollen: Secondary | ICD-10-CM

## 2019-06-07 DIAGNOSIS — O0990 Supervision of high risk pregnancy, unspecified, unspecified trimester: Secondary | ICD-10-CM

## 2019-06-07 MED ORDER — ASPIRIN 81 MG PO CHEW: 81.0000 mg | CHEWABLE_TABLET | Freq: Every day | ORAL | 6 refills | Status: DC

## 2019-06-07 MED ORDER — VITAFOL GUMMIES 3.33-0.333-34.8 MG PO CHEW: 3.0000 | CHEWABLE_TABLET | Freq: Every day | ORAL | 11 refills | Status: AC

## 2019-06-07 MED ORDER — LORATADINE 10 MG PO TABS: 10.0000 mg | ORAL_TABLET | Freq: Every day | ORAL | 11 refills | Status: DC

## 2019-06-07 MED ORDER — FERROUS SULFATE 325 (65 FE) MG PO TABS: 325.0000 mg | ORAL_TABLET | Freq: Two times a day (BID) | ORAL | 5 refills | Status: DC

## 2019-06-07 NOTE — Progress Notes (Signed)
NOB  Not Planned pregnancy  Last pap: 05/08/2017 WNL   Genetic testing : [facility]  INTAKE DONE on 05/30/19.  UA done on 05/09/19  CC: None

## 2019-06-07 NOTE — Progress Notes (Signed)
Subjective:    Brittney [name] is being seen today for her first obstetrical visit.  This is not a planned pregnancy. She is at [redacted]w[redacted]d gestation. Her obstetrical history is significant for previous preterm delivery, Gestational Diabetes. Relationship with FOB: significant other, not living together. Patient does intend to breast feed. Pregnancy history fully reviewed.  The information documented in the HPI was reviewed and verified.  Menstrual History: OB History    Gravida  7   Para  3   Term  1   Preterm  2   AB  3   Living  3     SAB  2   TAB      Ectopic  1   Multiple  0   Live Births  3            Patient's last menstrual period was 03/04/2019.    Past Medical History:  Diagnosis Date  . Headache   . Preterm labor     Past Surgical History:  Procedure Laterality Date  . NO PAST SURGERIES      (Not in a hospital admission)  Allergies  Allergen Reactions  . Amoxicillin Hives    Social History   Tobacco Use  . Smoking status: Never Smoker  . Smokeless tobacco: Never Used  Substance Use Topics  . Alcohol use: No    Alcohol/week: 0.0 standard drinks    Family History  Problem Relation Age of Onset  . Diabetes Other   . Hypertension Mother   . Hypertension Father   . Epilepsy Sister   . Epilepsy Brother   . Diabetes Maternal Aunt      Review of Systems Constitutional: negative for weight loss Gastrointestinal: negative for vomiting Genitourinary:negative for genital lesions and vaginal discharge and dysuria Musculoskeletal:negative for back pain Behavioral/Psych: negative for abusive relationship, depression, illegal drug usage and tobacco use    Objective:    BP 106/68   Pulse 81   Wt 164 lb (74.4 kg)   LMP 03/04/2019   BMI 29.05 kg/m  General Appearance:    Alert, cooperative, no distress, appears stated age  Head:    Normocephalic, without obvious abnormality, atraumatic  Eyes:    PERRL, conjunctiva/corneas clear, EOM's  intact, fundi    benign, both eyes  Ears:    Normal TM's and external ear canals, both ears  Nose:   Nares normal, septum midline, mucosa normal, no drainage    or sinus tenderness  Throat:   Lips, mucosa, and tongue normal; teeth and gums normal  Neck:   Supple, symmetrical, trachea midline, no adenopathy;    thyroid:  no enlargement/tenderness/nodules; no carotid   bruit or JVD  Back:     Symmetric, no curvature, ROM normal, no CVA tenderness  Lungs:     Clear to auscultation bilaterally, respirations unlabored  Chest Wall:    No tenderness or deformity   Heart:    Regular rate and rhythm, S1 and S2 normal, no murmur, rub   or gallop  Breast Exam:    No tenderness, masses, or nipple abnormality  Abdomen:     Soft, non-tender, bowel sounds active all four quadrants,    no masses, no organomegaly  Genitalia:    Normal female without lesion, discharge or tenderness  Extremities:   Extremities normal, atraumatic, no cyanosis or edema  Pulses:   2+ and symmetric all extremities  Skin:   Skin color, texture, turgor normal, no rashes or lesions  Lymph nodes:  Cervical, supraclavicular, and axillary nodes normal  Neurologic:   CNII-XII intact, normal strength, sensation and reflexes    throughout      Lab Review Urine pregnancy test Labs reviewed yes Radiologic studies reviewed no  Assessment:    Pregnancy at [redacted]w[redacted]d weeks    Plan:      Prenatal vitamins.  Counseling provided regarding continued use of seat belts, cessation of alcohol consumption, smoking or use of illicit drugs; infection precautions i.e., influenza/TDAP immunizations, toxoplasmosis,CMV, parvovirus, listeria and varicella; workplace safety, exercise during pregnancy; routine dental care, safe medications, sexual activity, hot tubs, saunas, pools, travel, caffeine use, fish and methlymercury, potential toxins, hair treatments, varicose veins Weight gain recommendations per IOM guidelines reviewed: underweight/BMI<  18.5--> gain 28 - 40 lbs; normal weight/BMI 18.5 - 24.9--> gain 25 - 35 lbs; overweight/BMI 25 - 29.9--> gain 15 - 25 lbs; obese/BMI >30->gain  11 - 20 lbs Problem list reviewed and updated. FIRST/CF mutation testing/NIPT/QUAD SCREEN/fragile X/Ashkenazi Jewish population testing/Spinal muscular atrophy discussed: requested. Role of ultrasound in pregnancy discussed; fetal survey: requested. Amniocentesis discussed: not indicated.  No orders of the defined types were placed in this encounter.  Orders Placed This Encounter  Procedures  . Obstetric Panel, Including HIV  . Genetic Screening    [place]  . Hepatitis C antibody    Follow up in 3 weeks. 50% of 20 min visit spent on counseling and coordination of care.     Brock Bad, MD 06/07/2019 9:19 AM

## 2019-06-08 LAB — OBSTETRIC PANEL, INCLUDING HIV
Antibody Screen: NEGATIVE
Basophils Absolute: 0.1 10*3/uL (ref 0.0–0.2)
Basos: 1 %
EOS (ABSOLUTE): 0.2 10*3/uL (ref 0.0–0.4)
Eos: 2 %
HIV Screen 4th Generation wRfx: NONREACTIVE
Hematocrit: 37.1 % (ref 34.0–46.6)
Hemoglobin: 12.1 g/dL (ref 11.1–15.9)
Hepatitis B Surface Ag: NEGATIVE
Immature Grans (Abs): 0 10*3/uL (ref 0.0–0.1)
Immature Granulocytes: 0 %
Lymphocytes Absolute: 2.3 10*3/uL (ref 0.7–3.1)
Lymphs: 23 %
MCH: 30.6 pg (ref 26.6–33.0)
MCHC: 32.6 g/dL (ref 31.5–35.7)
MCV: 94 fL (ref 79–97)
Monocytes Absolute: 0.5 10*3/uL (ref 0.1–0.9)
Monocytes: 5 %
Neutrophils Absolute: 6.9 10*3/uL (ref 1.4–7.0)
Neutrophils: 69 %
Platelets: 327 10*3/uL (ref 150–450)
RBC: 3.96 x10E6/uL (ref 3.77–5.28)
RDW: 13.2 % (ref 11.7–15.4)
RPR Ser Ql: NONREACTIVE
Rh Factor: POSITIVE
Rubella Antibodies, IGG: 2.86 index (ref >0.99)
WBC: 10 10*3/uL (ref 3.4–10.8)

## 2019-06-08 LAB — HEMOGLOBIN A1C
Est. average glucose Bld gHb Est-mCnc: 100 mg/dL
Hgb A1c MFr Bld: 5.1 % (ref 4.8–5.6)

## 2019-06-08 LAB — HEPATITIS C ANTIBODY: Hep C Virus Ab: 0.1 s/co ratio (ref 0.0–0.9)

## 2019-06-10 ENCOUNTER — Other Ambulatory Visit: Payer: Self-pay | Admitting: Obstetrics

## 2019-06-10 LAB — CERVICOVAGINAL ANCILLARY ONLY
Bacterial Vaginitis (gardnerella): NEGATIVE
Candida Glabrata: NEGATIVE
Candida Vaginitis: POSITIVE — AB
Chlamydia: NEGATIVE
Comment: NEGATIVE
Comment: NEGATIVE
Comment: NEGATIVE
Comment: NEGATIVE
Comment: NEGATIVE
Comment: NORMAL
Neisseria Gonorrhea: NEGATIVE
Trichomonas: NEGATIVE

## 2019-06-12 LAB — CYTOLOGY - PAP
Comment: NEGATIVE
High risk HPV: NEGATIVE

## 2019-06-13 ENCOUNTER — Encounter: Payer: Self-pay | Admitting: Obstetrics

## 2019-06-13 ENCOUNTER — Telehealth: Payer: Self-pay

## 2019-06-13 NOTE — Telephone Encounter (Signed)
Pt called back regarding pap results and was mad aware of results,

## 2019-06-18 ENCOUNTER — Encounter: Payer: Self-pay | Admitting: Obstetrics

## 2019-06-28 ENCOUNTER — Encounter: Payer: Medicaid Other | Admitting: Obstetrics and Gynecology

## 2019-06-28 ENCOUNTER — Other Ambulatory Visit: Payer: Medicaid Other

## 2019-07-03 ENCOUNTER — Other Ambulatory Visit: Payer: Self-pay

## 2019-07-03 ENCOUNTER — Other Ambulatory Visit: Payer: Medicaid Other

## 2019-07-03 ENCOUNTER — Ambulatory Visit (INDEPENDENT_AMBULATORY_CARE_PROVIDER_SITE_OTHER): Payer: Medicaid Other | Admitting: Obstetrics & Gynecology

## 2019-07-03 VITALS — BP 114/73 | HR 85 | Wt 171.0 lb

## 2019-07-03 DIAGNOSIS — Z3480 Encounter for supervision of other normal pregnancy, unspecified trimester: Secondary | ICD-10-CM

## 2019-07-03 DIAGNOSIS — O3442 Maternal care for other abnormalities of cervix, second trimester: Secondary | ICD-10-CM

## 2019-07-03 DIAGNOSIS — R87610 Atypical squamous cells of undetermined significance on cytologic smear of cervix (ASC-US): Secondary | ICD-10-CM | POA: Insufficient documentation

## 2019-07-03 DIAGNOSIS — Z3A17 17 weeks gestation of pregnancy: Secondary | ICD-10-CM

## 2019-07-03 DIAGNOSIS — R87612 Low grade squamous intraepithelial lesion on cytologic smear of cervix (LGSIL): Secondary | ICD-10-CM | POA: Insufficient documentation

## 2019-07-03 DIAGNOSIS — O09212 Supervision of pregnancy with history of pre-term labor, second trimester: Secondary | ICD-10-CM

## 2019-07-03 NOTE — Progress Notes (Signed)
   PRENATAL VISIT NOTE  Subjective:  Brittney [name] is a 29 y.o. [phone] at [redacted]w[redacted]d being seen today for ongoing prenatal care.  She is currently monitored for the following issues for this high-risk pregnancy and has Eczema; Abnormal glucose affecting pregnancy; Previous preterm delivery, antepartum; Supervision of high-risk pregnancy; Supervision of normal pregnancy, antepartum; and LGSIL on Pap smear of cervix on their problem list.  Patient reports no complaints and possible umbilical hernia.  Contractions: Not present. Vag. Bleeding: None.  Movement: Present. Denies leaking of fluid.   The following portions of the patient's history were reviewed and updated as appropriate: allergies, current medications, past family history, past medical history, past social history, past surgical history and problem list.   Objective:   Vitals:   07/03/19 0940  BP: 114/73  Pulse: 85  Weight: 77.6 kg    Fetal Status: Fetal Heart Rate (bpm): 158   Movement: Present     General:  Alert, oriented and cooperative. Patient is in no acute distress.  Skin: Skin is warm and dry. No rash noted.   Cardiovascular: Normal heart rate noted  Respiratory: Normal respiratory effort, no problems with respiration noted  Abdomen: Soft, gravid, appropriate for gestational age.  Pain/Pressure: Absent     Pelvic: Cervical exam deferred        Extremities: Normal range of motion.  Edema: None  Mental Status: Normal mood and affect. Normal behavior. Normal judgment and thought content.   Assessment and Plan:  Pregnancy: [id number] at [redacted]w[redacted]d 1. Supervision of other normal pregnancy, antepartum Had nl A1c - Glucose Tolerance, 2 Hours w/1 Hour  2. LGSIL on Pap smear of cervix colpo pp per Dr. Clearance Coots  Preterm labor symptoms and general obstetric precautions including but not limited to vaginal bleeding, contractions, leaking of fluid and fetal movement were reviewed in detail with the patient. Please refer to After  Visit Summary for other counseling recommendations.   Return in about 4 weeks (around 07/31/2019).  Future Appointments  Date Time Provider Department Center  07/03/2019 11:15 AM Adam Phenix, MD [facility] None  07/10/2019  8:30 AM [facility] NURSE [facility]  07/10/2019  8:30 AM [facility] US3 [facility]    Scheryl Darter, MD

## 2019-07-04 ENCOUNTER — Encounter (HOSPITAL_COMMUNITY): Payer: Self-pay | Admitting: Obstetrics & Gynecology

## 2019-07-04 ENCOUNTER — Other Ambulatory Visit: Payer: Self-pay

## 2019-07-04 ENCOUNTER — Inpatient Hospital Stay (HOSPITAL_COMMUNITY)
Admission: AD | Admit: 2019-07-04 | Discharge: 2019-07-04 | Disposition: A | Discharge status: Home / Self Care | Payer: Medicaid Other | Attending: Obstetrics & Gynecology | Admitting: Obstetrics & Gynecology

## 2019-07-04 DIAGNOSIS — L299 Pruritus, unspecified: Secondary | ICD-10-CM

## 2019-07-04 DIAGNOSIS — Z3480 Encounter for supervision of other normal pregnancy, unspecified trimester: Secondary | ICD-10-CM

## 2019-07-04 DIAGNOSIS — Z79899 Other long term (current) drug therapy: Secondary | ICD-10-CM | POA: Diagnosis not present

## 2019-07-04 DIAGNOSIS — L298 Other pruritus: Secondary | ICD-10-CM | POA: Insufficient documentation

## 2019-07-04 DIAGNOSIS — Z880 Allergy status to penicillin: Secondary | ICD-10-CM | POA: Diagnosis not present

## 2019-07-04 DIAGNOSIS — Z3A17 17 weeks gestation of pregnancy: Secondary | ICD-10-CM

## 2019-07-04 DIAGNOSIS — N898 Other specified noninflammatory disorders of vagina: Secondary | ICD-10-CM | POA: Insufficient documentation

## 2019-07-04 DIAGNOSIS — Z7982 Long term (current) use of aspirin: Secondary | ICD-10-CM | POA: Diagnosis not present

## 2019-07-04 DIAGNOSIS — O26892 Other specified pregnancy related conditions, second trimester: Secondary | ICD-10-CM

## 2019-07-04 DIAGNOSIS — B379 Candidiasis, unspecified: Secondary | ICD-10-CM

## 2019-07-04 LAB — URINALYSIS, ROUTINE W REFLEX MICROSCOPIC
Bilirubin Urine: NEGATIVE
Glucose, UA: NEGATIVE mg/dL
Hgb urine dipstick: NEGATIVE
Ketones, ur: NEGATIVE mg/dL
Nitrite: NEGATIVE
Protein, ur: 30 mg/dL — AB
Specific Gravity, Urine: 1.027 (ref 1.005–1.030)
pH: 5 (ref 5.0–8.0)

## 2019-07-04 LAB — GLUCOSE TOLERANCE, 2 HOURS W/ 1HR
Glucose, 1 hour: 120 mg/dL (ref 65–179)
Glucose, 2 hour: 92 mg/dL (ref 65–152)
Glucose, Fasting: 82 mg/dL (ref 65–91)

## 2019-07-04 LAB — WET PREP, GENITAL
Clue Cells Wet Prep HPF POC: NONE SEEN
Sperm: NONE SEEN
Trich, Wet Prep: NONE SEEN
Yeast Wet Prep HPF POC: NONE SEEN

## 2019-07-04 MED ORDER — TERCONAZOLE 0.4 % VA CREA: 1.0000 | TOPICAL_CREAM | Freq: Every day | VAGINAL | 0 refills | Status: DC

## 2019-07-04 NOTE — MAU Note (Signed)
Having some itching and irritation noted it last night.  Has been using Clotrimazole 1% for a yeast infection, not using it every day, had taken away the d/c, not it has changed and she is itching more.

## 2019-07-04 NOTE — MAU Provider Note (Signed)
History     CSN: [id number]  Arrival date and time: 07/04/19 0277   First Provider Initiated Contact with Patient 07/04/19 2010      Chief Complaint  Patient presents with  . Vaginal Discharge  . Vaginal Itching   HPI Brittney [name] 29 y.o. [redacted]w[redacted]d Comes to MAU today with vaginal and perineal itching.  She has been treating for yeast with OTC clotrimazole.  Itching had cleared initially but has now returned.  Is having white liquid discharge with no odor.  [facility]  OB History    Gravida  7   Para  3   Term  1   Preterm  2   AB  3   Living  3     SAB  2   TAB      Ectopic  1   Multiple  0   Live Births  3           Past Medical History:  Diagnosis Date  . Headache   . Preterm labor     Past Surgical History:  Procedure Laterality Date  . NO PAST SURGERIES      Family History  Problem Relation Age of Onset  . Diabetes Other   . Hypertension Mother   . Hypertension Father   . Epilepsy Sister   . Epilepsy Brother   . Diabetes Maternal Aunt     Social History   Tobacco Use  . Smoking status: Never Smoker  . Smokeless tobacco: Never Used  Substance Use Topics  . Alcohol use: No    Alcohol/week: 0.0 standard drinks  . Drug use: No    Allergies:  Allergies  Allergen Reactions  . Amoxicillin Hives    Medications Prior to Admission  Medication Sig Dispense Refill Last Dose  . aspirin 81 MG chewable tablet Chew 1 tablet (81 mg total) by mouth daily. 30 tablet 6 07/04/2019 at Unknown time  . clotrimazole (CLOTRIMAZOLE-7) 1 % vaginal cream Place 1 Applicatorful vaginally at bedtime. 45 g 0 Past Month at Unknown time  . loratadine (CLARITIN) 10 MG tablet Take 1 tablet (10 mg total) by mouth daily. 30 tablet 11 Past Month at Unknown time  . Prenatal Vit-Fe Phos-FA-Omega (VITAFOL GUMMIES) 3.33-0.333-34.8 MG CHEW Chew 3 tablets by mouth daily before breakfast. 90 tablet 11 07/04/2019 at Unknown time  . Blood  Pressure KIT 1 kit by Does not apply route daily. 1 kit 0   . ferrous sulfate 325 (65 FE) MG tablet Take 1 tablet (325 mg total) by mouth 2 (two) times daily with a meal. (Patient not taking: Reported on 07/03/2019) 60 tablet 5     Review of Systems  Constitutional: Negative for fever.  Respiratory: Negative for cough, shortness of breath and wheezing.   Gastrointestinal: Negative for abdominal pain, nausea and vomiting.  Genitourinary: Positive for vaginal discharge. Negative for dysuria and vaginal bleeding.   Physical Exam   Blood pressure 117/66, pulse 98, temperature 98.7 F (37.1 C), temperature source Oral, resp. rate 18, height _0  (1.6 m), weight 78.8 kg, last menstrual period 03/04/2019, SpO2 99 %, not currently breastfeeding.  Physical Exam  Nursing note and vitals reviewed. Constitutional: She is oriented to person, place, and time. She appears well-developed and well-nourished.  HENT:  Head: Normocephalic.  Eyes: EOM are normal.  GI: Soft. There is no abdominal tenderness.  Genitourinary:    Genitourinary Comments: RN performed blind vaginal swabs.  Reports no change in  perineal skin.   Musculoskeletal:        General: Normal range of motion.     Cervical back: Neck supple.  Neurological: She is alert and oriented to person, place, and time.  Skin: Skin is warm and dry.  Psychiatric: She has a normal mood and affect.    MAU Course  Procedures Results for orders placed or performed during the hospital encounter of 07/04/19 (from the past 24 hour(s))  Urinalysis, Routine w reflex microscopic     Status: Abnormal   Collection Time: 07/04/19  6:59 PM  Result Value Ref Range   Color, Urine YELLOW YELLOW   APPearance CLOUDY (A) CLEAR   Specific Gravity, Urine 1.027 1.005 - 1.030   pH 5.0 5.0 - 8.0   Glucose, UA NEGATIVE NEGATIVE mg/dL   Hgb urine dipstick NEGATIVE NEGATIVE   Bilirubin Urine NEGATIVE NEGATIVE   Ketones, ur NEGATIVE NEGATIVE mg/dL   Protein, ur 30  (A) NEGATIVE mg/dL   Nitrite NEGATIVE NEGATIVE   Leukocytes,Ua MODERATE (A) NEGATIVE   RBC / HPF 0-5 0 - 5 RBC/hpf   WBC, UA 11-20 0 - 5 WBC/hpf   Bacteria, UA FEW (A) NONE SEEN   Squamous Epithelial / LPF 11-20 0 - 5   Mucus PRESENT   Wet prep, genital     Status: Abnormal   Collection Time: 07/04/19  7:46 PM   Specimen: PATH Cytology Cervicovaginal Ancillary Only  Result Value Ref Range   Yeast Wet Prep HPF POC NONE SEEN NONE SEEN   Trich, Wet Prep NONE SEEN NONE SEEN   Clue Cells Wet Prep HPF POC NONE SEEN NONE SEEN   WBC, Wet Prep HPF POC MODERATE (A) NONE SEEN   Sperm NONE SEEN     MDM Has been treating with OTC cream so will try Terazol to see if her itching will improve with this treatment.  No other reason for itching is identified after reviewing possible dermatology reasons for itching.  GC/Chlam pending.  Assessment and Plan  Vaginal itching likely continued yeast infection  Plan Terazol vaginal cream sent to her pharmacy. Keep appointments in the clinic. Labs pending. See the AVS for additional info given to client.  Caralyn Twining L Daundre Biel 07/04/2019, 8:29 PM

## 2019-07-04 NOTE — Discharge Instructions (Signed)
Keep your appointments in the office °

## 2019-07-04 NOTE — Progress Notes (Signed)
Terri Burleson NP in earlier to discuss test results and d/c plan. Written and verbal d/c instructions given and understanding voiced 

## 2019-07-05 LAB — GC/CHLAMYDIA PROBE AMP (~~LOC~~) NOT AT ARMC
Chlamydia: NEGATIVE
Comment: NEGATIVE
Comment: NORMAL
Neisseria Gonorrhea: NEGATIVE

## 2019-07-10 ENCOUNTER — Other Ambulatory Visit: Payer: Self-pay

## 2019-07-10 ENCOUNTER — Other Ambulatory Visit: Payer: Self-pay | Admitting: *Deleted

## 2019-07-10 ENCOUNTER — Ambulatory Visit (HOSPITAL_COMMUNITY): Payer: Medicaid Other | Attending: Obstetrics and Gynecology

## 2019-07-10 ENCOUNTER — Ambulatory Visit: Payer: Medicaid Other | Admitting: *Deleted

## 2019-07-10 VITALS — BP 121/77 | HR 94

## 2019-07-10 DIAGNOSIS — O09292 Supervision of pregnancy with other poor reproductive or obstetric history, second trimester: Secondary | ICD-10-CM

## 2019-07-10 DIAGNOSIS — O09212 Supervision of pregnancy with history of pre-term labor, second trimester: Secondary | ICD-10-CM

## 2019-07-10 DIAGNOSIS — Z363 Encounter for antenatal screening for malformations: Secondary | ICD-10-CM

## 2019-07-10 DIAGNOSIS — O0990 Supervision of high risk pregnancy, unspecified, unspecified trimester: Secondary | ICD-10-CM | POA: Insufficient documentation

## 2019-07-10 DIAGNOSIS — Z3A18 18 weeks gestation of pregnancy: Secondary | ICD-10-CM | POA: Diagnosis not present

## 2019-07-10 DIAGNOSIS — Z362 Encounter for other antenatal screening follow-up: Secondary | ICD-10-CM

## 2019-07-31 ENCOUNTER — Telehealth (INDEPENDENT_AMBULATORY_CARE_PROVIDER_SITE_OTHER): Payer: Medicaid Other | Admitting: Obstetrics & Gynecology

## 2019-07-31 VITALS — BP 138/78

## 2019-07-31 DIAGNOSIS — Z3A21 21 weeks gestation of pregnancy: Secondary | ICD-10-CM

## 2019-07-31 DIAGNOSIS — O09212 Supervision of pregnancy with history of pre-term labor, second trimester: Secondary | ICD-10-CM

## 2019-07-31 DIAGNOSIS — O0991 Supervision of high risk pregnancy, unspecified, first trimester: Secondary | ICD-10-CM

## 2019-07-31 DIAGNOSIS — Z3490 Encounter for supervision of normal pregnancy, unspecified, unspecified trimester: Secondary | ICD-10-CM

## 2019-07-31 DIAGNOSIS — O09219 Supervision of pregnancy with history of pre-term labor, unspecified trimester: Secondary | ICD-10-CM

## 2019-07-31 NOTE — Patient Instructions (Signed)
Postpartum Tubal Ligation Postpartum tubal ligation (PPTL) is a procedure to close the fallopian tubes. This is done so that you cannot get pregnant. When the fallopian tubes are closed, the eggs that the ovaries release cannot enter the uterus, and sperm cannot reach the eggs. PPTL is done right after childbirth or 1-2 days after childbirth, before the uterus returns to its normal location. If you have a cesarean section, it can be performed at the same time as the procedure. Having this done after childbirth does not make your stay in the hospital longer. PPTL is sometimes called "getting your tubes tied." You should not have this procedure if you want to get pregnant again or if you are unsure about having more children. Tell a health care provider about:  Any allergies you have.  All medicines you are taking, including vitamins, herbs, eye drops, creams, and over-the-counter medicines.  Any problems you or family members have had with anesthetic medicines.  Any blood disorders you have.  Any surgeries you have had.  Any medical conditions you have or have had.  Any past pregnancies. What are the risks? Generally, this is a safe procedure. However, problems may occur, including:  Infection.  Bleeding.  Injury to other organs in the abdomen.  Side effects from anesthetic medicines.  Failure of the procedure. If this happens, you could get pregnant.  Having a fertilized egg attach outside the uterus (ectopic pregnancy). What happens before the procedure?  Ask your health care provider about: ? How much pain you can expect to have. ? What medicines you will be given for pain, especially if you are planning to breastfeed. What happens during the procedure? If you had a vaginal delivery:  You will be given one or more of the following: ? A medicine to help you relax (sedative). ? A medicine to numb the area (local anesthetic). ? A medicine to make you fall asleep (general  anesthetic). ? A medicine that is injected into an area of your body to numb everything below the injection site (regional anesthetic).  If you have been given a general anesthetic, a tube will be put down your throat to help you breathe.  An IV will be inserted into one of your veins.  Your bladder may be emptied with a small tube (catheter).  An incision will be made just below your belly button.  Your fallopian tubes will be located and brought up through the incision.  Your fallopian tubes will be tied off, burned (cauterized), or blocked with a clip, ring, or clamp. A small part in the center of each fallopian tube may be removed.  The incision will be closed with stitches (sutures).  A bandage (dressing) will be placed over the incision. If you had a cesarean delivery:  Tubal ligation will be done through the incision that was used for the cesarean delivery of your baby.  The incision will be closed with sutures.  A dressing will be placed over the incision. The procedure may vary among health care providers and hospitals. What happens after the procedure?  Your blood pressure, heart rate, breathing rate, and blood oxygen level will be monitored until you leave the hospital.  You will be given pain medicine as needed.  Do not drive for 24 hours if you were given a sedative during your procedure. Summary  Postpartum tubal ligation is a procedure that closes the fallopian tubes so you cannot get pregnant anymore.  This procedure is done while you are still   in the hospital after childbirth. If you have a cesarean section, it can be performed at the same time.  Having this done after childbirth does not make your stay in the hospital longer.  Postpartum tubal ligation is considered permanent. You should not have this procedure if you want to get pregnant again or if you are unsure about having more children.  Talk to your health care provider to see if this procedure is  right for you. This information is not intended to replace advice given to you by your health care provider. Make sure you discuss any questions you have with your health care provider. Document Revised: 07/09/2018 Document Reviewed: 12/14/2017 Elsevier Patient Education  [place] of Pregnancy  The second trimester is from week 14 through week 27 (month 4 through 6). This is often the time in pregnancy that you feel your best. Often times, morning sickness has lessened or quit. You may have more energy, and you may get hungry more often. Your unborn baby is growing rapidly. At the end of the sixth month, he or she is about 9 inches long and weighs about 1 pounds. You will likely feel the baby move between 18 and 20 weeks of pregnancy. Follow these instructions at home: Medicines  Take over-the-counter and prescription medicines only as told by your doctor. Some medicines are safe and some medicines are not safe during pregnancy.  Take a prenatal vitamin that contains at least 600 micrograms (mcg) of folic acid.  If you have trouble pooping (constipation), take medicine that will make your stool soft (stool softener) if your doctor approves. Eating and drinking   Eat regular, healthy meals.  Avoid raw meat and uncooked cheese.  If you get low calcium from the food you eat, talk to your doctor about taking a daily calcium supplement.  Avoid foods that are high in fat and sugars, such as fried and sweet foods.  If you feel sick to your stomach (nauseous) or throw up (vomit): ? Eat 4 or 5 small meals a day instead of 3 large meals. ? Try eating a few soda crackers. ? Drink liquids between meals instead of during meals.  To prevent constipation: ? Eat foods that are high in fiber, like fresh fruits and vegetables, whole grains, and beans. ? Drink enough fluids to keep your pee (urine) clear or pale yellow. Activity  Exercise only as told by your doctor.  Stop exercising if you start to have cramps.  Do not exercise if it is too hot, too humid, or if you are in a place of great height (high altitude).  Avoid heavy lifting.  Wear low-heeled shoes. Sit and stand up straight.  You can continue to have sex unless your doctor tells you not to. Relieving pain and discomfort  Wear a good support bra if your breasts are tender.  Take warm water baths (sitz baths) to soothe pain or discomfort caused by hemorrhoids. Use hemorrhoid cream if your doctor approves.  Rest with your legs raised if you have leg cramps or low back pain.  If you develop puffy, bulging veins (varicose veins) in your legs: ? Wear support hose or compression stockings as told by your doctor. ? Raise (elevate) your feet for 15 minutes, 3-4 times a day. ? Limit salt in your food. Prenatal care  Write down your questions. Take them to your prenatal visits.  Keep all your prenatal visits as told by your doctor. This is important. Safety  Wear your seat belt when driving.  Make a list of emergency phone numbers, including numbers for family, friends, the hospital, and police and fire departments. General instructions  Ask your doctor about the right foods to eat or for help finding a counselor, if you need these services.  Ask your doctor about local prenatal classes. Begin classes before month 6 of your pregnancy.  Do not use hot tubs, steam rooms, or saunas.  Do not douche or use tampons or scented sanitary pads.  Do not cross your legs for long periods of time.  Visit your dentist if you have not done so. Use a soft toothbrush to brush your teeth. Floss gently.  Avoid all smoking, herbs, and alcohol. Avoid drugs that are not approved by your doctor.  Do not use any products that contain nicotine or tobacco, such as cigarettes and e-cigarettes. If you need help quitting, ask your doctor.  Avoid cat litter boxes and soil used by cats. These carry germs that can  cause birth defects in the baby and can cause a loss of your baby (miscarriage) or stillbirth. Contact a doctor if:  You have mild cramps or pressure in your lower belly.  You have pain when you pee (urinate).  You have bad smelling fluid coming from your vagina.  You continue to feel sick to your stomach (nauseous), throw up (vomit), or have watery poop (diarrhea).  You have a nagging pain in your belly area.  You feel dizzy. Get help right away if:  You have a fever.  You are leaking fluid from your vagina.  You have spotting or bleeding from your vagina.  You have severe belly cramping or pain.  You lose or gain weight rapidly.  You have trouble catching your breath and have chest pain.  You notice sudden or extreme puffiness (swelling) of your face, hands, ankles, feet, or legs.  You have not felt the baby move in over an hour.  You have severe headaches that do not go away when you take medicine.  You have trouble seeing. Summary  The second trimester is from week 14 through week 27 (months 4 through 6). This is often the time in pregnancy that you feel your best.  To take care of yourself and your unborn baby, you will need to eat healthy meals, take medicines only if your doctor tells you to do so, and do activities that are safe for you and your baby.  Call your doctor if you get sick or if you notice anything unusual about your pregnancy. Also, call your doctor if you need help with the right food to eat, or if you want to know what activities are safe for you. This information is not intended to replace advice given to you by your health care provider. Make sure you discuss any questions you have with your health care provider. Document Revised: 05/18/2018 Document Reviewed: 03/01/2016 Elsevier Patient Education  2020 [employer].

## 2019-07-31 NOTE — Progress Notes (Signed)
Pt is on the phone preparing for virtual visit with provider, [redacted]w[redacted]d.

## 2019-07-31 NOTE — Progress Notes (Signed)
   TELEHEALTH OBSTETRICS VISIT ENCOUNTER NOTE  I connected with Brittney [name] on 07/31/19 at 10:00 AM EDT by telephone at home and verified that I am speaking with the correct person using two identifiers.   I discussed the limitations, risks, security and privacy concerns of performing an evaluation and management service by telephone and the availability of in person appointments. I also discussed with the patient that there may be a patient responsible charge related to this service. The patient expressed understanding and agreed to proceed.  Subjective:  Brittney [name] is a 29 y.o. [phone] at [redacted]w[redacted]d being followed for ongoing prenatal care.  She is currently monitored for the following issues for this high-risk pregnancy and has Eczema; Previous preterm delivery, antepartum; Supervision of high-risk pregnancy; Supervision of normal pregnancy, antepartum; and LGSIL on Pap smear of cervix on their problem list.  Patient reports no complaints. Reports fetal movement. Denies any contractions, bleeding or leaking of fluid.   The following portions of the patient's history were reviewed and updated as appropriate: allergies, current medications, past family history, past medical history, past social history, past surgical history and problem list.   Objective:   General:  Alert, oriented and cooperative.   Mental Status: Normal mood and affect perceived. Normal judgment and thought content.  Rest of physical exam deferred due to type of encounter  Assessment and Plan:  Pregnancy: [id number] at [redacted]w[redacted]d 1. Encounter for supervision of normal pregnancy, antepartum, unspecified gravidity Doing well  2. Supervision of high risk pregnancy in first trimester   3. Previous preterm delivery, antepartum 36 weeks  Preterm labor symptoms and general obstetric precautions including but not limited to vaginal bleeding, contractions, leaking of fluid and fetal movement were reviewed in detail  with the patient.  I discussed the assessment and treatment plan with the patient. The patient was provided an opportunity to ask questions and all were answered. The patient agreed with the plan and demonstrated an understanding of the instructions. The patient was advised to call back or seek an in-person office evaluation/go to MAU at [facility] for any urgent or concerning symptoms. Please refer to After Visit Summary for other counseling recommendations.   I provided 10 minutes of non-face-to-face time during this encounter.  Return in about 3 weeks (around 08/21/2019) for virtual.  Future Appointments  Date Time Provider Department Center  08/07/2019  9:45 AM [facility] NURSE [facility]  08/07/2019  9:45 AM [facility] US4 [facility]    Scheryl Darter, MD Center for [facility], [facility]

## 2019-08-07 ENCOUNTER — Other Ambulatory Visit: Payer: Self-pay

## 2019-08-07 ENCOUNTER — Ambulatory Visit: Payer: Medicaid Other | Admitting: *Deleted

## 2019-08-07 ENCOUNTER — Ambulatory Visit: Payer: Medicaid Other | Attending: Obstetrics and Gynecology

## 2019-08-07 VITALS — BP 112/68 | HR 105

## 2019-08-07 DIAGNOSIS — Z3A22 22 weeks gestation of pregnancy: Secondary | ICD-10-CM

## 2019-08-07 DIAGNOSIS — O09212 Supervision of pregnancy with history of pre-term labor, second trimester: Secondary | ICD-10-CM

## 2019-08-07 DIAGNOSIS — O09292 Supervision of pregnancy with other poor reproductive or obstetric history, second trimester: Secondary | ICD-10-CM | POA: Diagnosis not present

## 2019-08-07 DIAGNOSIS — Z362 Encounter for other antenatal screening follow-up: Secondary | ICD-10-CM | POA: Insufficient documentation

## 2019-08-07 DIAGNOSIS — O09892 Supervision of other high risk pregnancies, second trimester: Secondary | ICD-10-CM

## 2019-08-21 ENCOUNTER — Telehealth (INDEPENDENT_AMBULATORY_CARE_PROVIDER_SITE_OTHER): Payer: Medicaid Other | Admitting: Obstetrics and Gynecology

## 2019-08-21 ENCOUNTER — Encounter: Payer: Self-pay | Admitting: Obstetrics and Gynecology

## 2019-08-21 DIAGNOSIS — Z8632 Personal history of gestational diabetes: Secondary | ICD-10-CM

## 2019-08-21 DIAGNOSIS — O09292 Supervision of pregnancy with other poor reproductive or obstetric history, second trimester: Secondary | ICD-10-CM

## 2019-08-21 DIAGNOSIS — Z3490 Encounter for supervision of normal pregnancy, unspecified, unspecified trimester: Secondary | ICD-10-CM

## 2019-08-21 DIAGNOSIS — Z3A24 24 weeks gestation of pregnancy: Secondary | ICD-10-CM

## 2019-08-21 MED ORDER — BLOOD PRESSURE KIT DEVI
1.0000 | 0 refills | Status: DC
Start: 2019-08-21 — End: 2021-05-27

## 2019-08-21 MED ORDER — COMFORT FIT MATERNITY SUPP LG MISC: 0 refills | Status: DC

## 2019-08-21 NOTE — Progress Notes (Signed)
OBSTETRICS PRENATAL VIRTUAL VISIT ENCOUNTER NOTE  Provider location: Center for [facility]   I connected with Brittney [name] on 08/21/19 at 10:45 AM EDT by MyChart Video Encounter at home and verified that I am speaking with the correct person using two identifiers.   I discussed the limitations, risks, security and privacy concerns of performing an evaluation and management service virtually and the availability of in person appointments. I also discussed with the patient that there may be a patient responsible charge related to this service. The patient expressed understanding and agreed to proceed. Subjective:  Brittney [name] is a 29 y.o. [phone] at [redacted]w[redacted]d being seen today for ongoing prenatal care.  She is currently monitored for the following issues for this high-risk pregnancy and has Eczema; Previous preterm delivery, antepartum; Supervision of high-risk pregnancy; Supervision of normal pregnancy, antepartum; LGSIL on Pap smear of cervix; and History of diet controlled gestational diabetes mellitus (GDM) on their problem list.  Patient reports backache.  Contractions: Irritability. Vag. Bleeding: None.  Movement: Present. Denies any leaking of fluid.   The following portions of the patient's history were reviewed and updated as appropriate: allergies, current medications, past family history, past medical history, past social history, past surgical history and problem list.   Objective:  There were no vitals filed for this visit.  Fetal Status:     Movement: Present     General:  Alert, oriented and cooperative. Patient is in no acute distress.  Respiratory: Normal respiratory effort, no problems with respiration noted  Mental Status: Normal mood and affect. Normal behavior. Normal judgment and thought content.  Rest of physical exam deferred due to type of encounter  Imaging: [place] MFM OB FOLLOW UP  Result Date:  08/07/2019 ----------------------------------------------------------------------  OBSTETRICS REPORT                       (Signed Final 08/07/2019 10:44 am) ---------------------------------------------------------------------- Patient Info  ID #:       [id number]                          D.O.B.:  06/16/1990 (28 yrs)  Name:       Brittney [name]            Visit Date: 08/07/2019 09:54 am ---------------------------------------------------------------------- Performed By  Attending:        Noralee Space MD        Ref. Address:     [address]                                                             [phone]  [place]                                                             [id number]  Performed By:     Eden Lathe BS      [facility]                                 Fetal Care  Referred By:      [facility] ---------------------------------------------------------------------- Orders  #  Description                           Code        Ordered By  1  [place] MFM OB FOLLOW UP                   [phone]    [facility] ----------------------------------------------------------------------  #  Order #                     Accession #                Episode #  1  [id number]                   [id number]                 [id number] ---------------------------------------------------------------------- Indications  Antenatal follow-up for nonvisualized fetal    Z36.2  anatomy (low risk NIPS)  Poor obstetric history: Previous preterm       O09.219  delivery, antepartum (36 weeks)  Poor obstetric history: Previous gestational   O09.299  diabetes  [redacted] weeks gestation of pregnancy                Z3A.22 ---------------------------------------------------------------------- Fetal Evaluation  Num Of Fetuses:         1  Fetal Heart Rate(bpm):  144   Cardiac Activity:       Observed  Presentation:           Cephalic  Placenta:               Posterior  P. Cord Insertion:      Visualized  Amniotic Fluid  AFI FV:      Within normal limits                              Largest Pocket(cm)                              3.7 ---------------------------------------------------------------------- Biometry  BPD:      53.4  mm     G. Age:  22w 2d         44  %    CI:         73.7   %    70 - 86  FL/HC:      19.2   %    18.4 - 20.2  HC:      197.6  mm     G. Age:  22w 0d         24  %    HC/AC:      1.08        1.06 - 1.25  AC:      182.8  mm     G. Age:  23w 1d         69  %    FL/BPD:     71.0   %    71 - 87  FL:       37.9  mm     G. Age:  22w 1d         34  %    FL/AC:      20.7   %    20 - 24  HUM:      35.5  mm     G. Age:  22w 2d         46  %  Est. FW:     516  gm      1 lb 2 oz     59  % ---------------------------------------------------------------------- OB History  Blood Type:   O+  Gravidity:    7         Term:   1        Prem:   2        SAB:   2  TOP:          0       Ectopic:  1        Living: 3 ---------------------------------------------------------------------- Gestational Age  LMP:           22w 2d        Date:  03/04/19                 EDD:   12/09/19  U/S Today:     22w 3d                                        EDD:   12/08/19  Best:          22w 2d     Det. By:  LMP  (03/04/19)          EDD:   12/09/19 ---------------------------------------------------------------------- Anatomy  Cranium:               Appears normal         LVOT:                   Appears normal  Cavum:                 Appears normal         Aortic Arch:            Appears normal  Ventricles:            Appears normal         Ductal Arch:            Appears normal  Choroid Plexus:        Appears normal         Diaphragm:              Appears normal  Cerebellum:            Appears normal         Stomach:                Appears  normal, left                                                                        sided  Posterior Fossa:       Appears normal         Abdomen:                Appears normal  Nuchal Fold:           Previously seen        Abdominal Wall:         Appears nml (cord                                                                        insert, abd wall)  Face:                  Appears normal         Cord Vessels:           Appears normal (3                         (orbits and profile)                           vessel cord)  Lips:                  Appears normal         Kidneys:                Appear normal  Palate:                Appears normal         Bladder:                Appears normal  Thoracic:              Appears normal         Spine:                  Previously seen  Heart:                 Appears normal         Upper Extremities:      Previously seen                         (4CH, axis, and                         situs)  RVOT:  Appears normal         Lower Extremities:      Previously seen  Other:  Open hands, heels and 5th digit previously visualized. Technically          difficult due to fetal position. ---------------------------------------------------------------------- Cervix Uterus Adnexa  Cervix  Length:           3.43  cm.  Normal appearance by transabdominal scan.  Uterus  No abnormality visualized.  Right Ovary  Not visualized.  Left Ovary  Not visualized.  Cul De Sac  No free fluid seen.  Adnexa  No abnormality visualized. ---------------------------------------------------------------------- Impression  Patient returned for completion of fetal anatomy .  Amniotic fluid is normal and good fetal activity is seen .Fetal  growth is appropriate for gestational age .Fetal anatomical  survey was completed and appears normal.  We reassured the patient. ---------------------------------------------------------------------- Recommendations  Follow-up scans as clinically indicated.  ----------------------------------------------------------------------                  Noralee Space, MD Electronically Signed Final Report   08/07/2019 10:44 am ----------------------------------------------------------------------   Assessment and Plan:  Pregnancy: [id number] at [redacted]w[redacted]d 1. Encounter for supervision of normal pregnancy, antepartum, unspecified gravidity Patient is doing well without complaints Patient needs to pick up previously prescribed maternity support belt Patient needs to pick up BP cuff  2. History of diet controlled gestational diabetes mellitus (GDM) Normal early 2 hour Glucola and third trimester labs next visit  Preterm labor symptoms and general obstetric precautions including but not limited to vaginal bleeding, contractions, leaking of fluid and fetal movement were reviewed in detail with the patient. I discussed the assessment and treatment plan with the patient. The patient was provided an opportunity to ask questions and all were answered. The patient agreed with the plan and demonstrated an understanding of the instructions. The patient was advised to call back or seek an in-person office evaluation/go to MAU at [facility] for any urgent or concerning symptoms. Please refer to After Visit Summary for other counseling recommendations.   I provided 15 minutes of face-to-face time during this encounter.  Return in about 4 weeks (around 09/18/2019) for in person, ROB, High risk, 2 hr glucola next visit.  Future Appointments  Date Time Provider Department Center  08/21/2019 10:45 AM Marcile Fuquay, Gigi Gin, MD [facility] None    Catalina Antigua, MD [facility] for [employer], [facility]

## 2019-08-21 NOTE — Progress Notes (Signed)
I connected with  Brittney [name] on 08/21/19 by a video enabled telemedicine application and verified that I am speaking with the correct person using two identifiers.   I discussed the limitations of evaluation and management by telemedicine. The patient expressed understanding and agreed to proceed.   MyChart  ROB c/o pressure/pain 5/10 onset two days ago, she wants maternity belt.  BP Cuff reordered for patient today, her wrist cuff is not working properly.

## 2019-09-14 ENCOUNTER — Other Ambulatory Visit: Payer: Self-pay

## 2019-09-14 ENCOUNTER — Encounter (HOSPITAL_COMMUNITY): Payer: Self-pay | Admitting: Obstetrics & Gynecology

## 2019-09-14 ENCOUNTER — Inpatient Hospital Stay (HOSPITAL_COMMUNITY)
Admission: AD | Admit: 2019-09-14 | Discharge: 2019-09-14 | Disposition: A | Discharge status: Home / Self Care | Payer: Medicaid Other | Attending: Obstetrics & Gynecology | Admitting: Obstetrics & Gynecology

## 2019-09-14 DIAGNOSIS — Z3A27 27 weeks gestation of pregnancy: Secondary | ICD-10-CM | POA: Diagnosis not present

## 2019-09-14 DIAGNOSIS — Z7982 Long term (current) use of aspirin: Secondary | ICD-10-CM | POA: Insufficient documentation

## 2019-09-14 DIAGNOSIS — O09212 Supervision of pregnancy with history of pre-term labor, second trimester: Secondary | ICD-10-CM | POA: Insufficient documentation

## 2019-09-14 DIAGNOSIS — B3731 Acute candidiasis of vulva and vagina: Secondary | ICD-10-CM

## 2019-09-14 DIAGNOSIS — Z8249 Family history of ischemic heart disease and other diseases of the circulatory system: Secondary | ICD-10-CM | POA: Diagnosis not present

## 2019-09-14 DIAGNOSIS — B373 Candidiasis of vulva and vagina: Secondary | ICD-10-CM

## 2019-09-14 DIAGNOSIS — O98812 Other maternal infectious and parasitic diseases complicating pregnancy, second trimester: Secondary | ICD-10-CM | POA: Insufficient documentation

## 2019-09-14 LAB — URINALYSIS, ROUTINE W REFLEX MICROSCOPIC
Bilirubin Urine: NEGATIVE
Glucose, UA: NEGATIVE mg/dL
Ketones, ur: NEGATIVE mg/dL
Leukocytes,Ua: NEGATIVE
Nitrite: NEGATIVE
Protein, ur: NEGATIVE mg/dL
Specific Gravity, Urine: 1.004 — ABNORMAL LOW (ref 1.005–1.030)
pH: 7 (ref 5.0–8.0)

## 2019-09-14 LAB — WET PREP, GENITAL
Clue Cells Wet Prep HPF POC: NONE SEEN
Sperm: NONE SEEN
Trich, Wet Prep: NONE SEEN
WBC, Wet Prep HPF POC: NONE SEEN
Yeast Wet Prep HPF POC: NONE SEEN

## 2019-09-14 MED ORDER — TERCONAZOLE 0.4 % VA CREA: 1.0000 | TOPICAL_CREAM | Freq: Every day | VAGINAL | 0 refills | Status: DC

## 2019-09-14 NOTE — Discharge Instructions (Signed)

## 2019-09-14 NOTE — MAU Provider Note (Signed)
History     CSN: [id number]  Arrival date and time: 09/14/19 1144   First Provider Initiated Contact with Patient 09/14/19 1214      Chief Complaint  Patient presents with   Vaginal Bleeding   Vaginal Itching   HPI Brittney [name] is a 29 y.o. [id number] at 41w5dwho presents with spotting and vaginal discomfort. She states she had one episode of spotting this morning when she wiped. She has seen no bleeding since. She denies any pain. She also reports some vaginal itching and discomfort. She reports normal fetal movement.   OB History     Gravida  7   Para  3   Term  1   Preterm  2   AB  3   Living  3      SAB  2   TAB      Ectopic  1   Multiple  0   Live Births  3           Past Medical History:  Diagnosis Date   Headache    Preterm labor     Past Surgical History:  Procedure Laterality Date   NO PAST SURGERIES      Family History  Problem Relation Age of Onset   Diabetes Other    Hypertension Mother    Hypertension Father    Epilepsy Sister    Epilepsy Brother    Diabetes Maternal Aunt     Social History   Tobacco Use   Smoking status: Never Smoker   Smokeless tobacco: Never Used  Vaping Use   Vaping Use: Never used  Substance Use Topics   Alcohol use: No    Alcohol/week: 0.0 standard drinks   Drug use: No    Allergies:  Allergies  Allergen Reactions   Amoxicillin Hives    Medications Prior to Admission  Medication Sig Dispense Refill Last Dose   aspirin 81 MG chewable tablet Chew 1 tablet (81 mg total) by mouth daily. 30 tablet 6    Blood Pressure KIT 1 kit by Does not apply route daily. 1 kit 0    Blood Pressure Monitoring (BLOOD PRESSURE KIT) DEVI 1 kit by Does not apply route once a week. Check Blood Pressure regularly and record readings into the Babyscripts App.  Large Cuff.  DX O90.0 1 each 0    Elastic Bandages & Supports (COMFORT FIT MATERNITY SUPP LG) MISC Wear daily when ambulating 1 each 0    loratadine  (CLARITIN) 10 MG tablet Take 1 tablet (10 mg total) by mouth daily. 30 tablet 11    Prenatal Vit-Fe Phos-FA-Omega (VITAFOL GUMMIES) 3.33-0.333-34.8 MG CHEW Chew 3 tablets by mouth daily before breakfast. 90 tablet 11    terconazole (TERAZOL 7) 0.4 % vaginal cream Place 1 applicator vaginally at bedtime. Use for 7 days. 45 g 0     Review of Systems  Constitutional: Negative.  Negative for fatigue and fever.  HENT: Negative.   Respiratory: Negative.  Negative for shortness of breath.   Cardiovascular: Negative.  Negative for chest pain.  Gastrointestinal: Negative.  Negative for abdominal pain, constipation, diarrhea, nausea and vomiting.  Genitourinary: Positive for vaginal bleeding and vaginal discharge. Negative for dysuria.  Neurological: Negative.  Negative for dizziness and headaches.   Physical Exam   Blood pressure 123/64, pulse 97, temperature 98.4 F (36.9 C), temperature source Oral, resp. rate 16, height '5\' 3"'  (1.6 m), weight 83.7 kg, last menstrual period 03/04/2019, SpO2 99 %, not currently  breastfeeding.  Physical Exam Vitals and nursing note reviewed.  Constitutional:      General: She is not in acute distress.    Appearance: She is well-developed.  HENT:     Head: Normocephalic.  Eyes:     Pupils: Pupils are equal, round, and reactive to light.  Cardiovascular:     Rate and Rhythm: Normal rate and regular rhythm.     Heart sounds: Normal heart sounds.  Pulmonary:     Effort: Pulmonary effort is normal. No respiratory distress.     Breath sounds: Normal breath sounds.  Abdominal:     General: Bowel sounds are normal. There is no distension.     Palpations: Abdomen is soft.     Tenderness: There is no abdominal tenderness.  Genitourinary:    Comments: SSE: Cervix pink, visually closed, without lesion, scant white creamy discharge, vaginal walls and external genitalia with thick white discharge Skin:    General: Skin is warm and dry.  Neurological:     Mental  Status: She is alert and oriented to person, place, and time.  Psychiatric:        Behavior: Behavior normal.        Thought Content: Thought content normal.        Judgment: Judgment normal.    Fetal Tracing:  Baseline: 140 Variability: 15x15 Accels: none Decels: none  Toco: one uc  Cervix: closed/thick/posterior  MAU Course  Procedures Results for orders placed or performed during the hospital encounter of 09/14/19 (from the past 24 hour(s))  Urinalysis, Routine w reflex microscopic Urine, Clean Catch     Status: Abnormal   Collection Time: 09/14/19 12:32 PM  Result Value Ref Range   Color, Urine STRAW (A) YELLOW   APPearance CLEAR CLEAR   Specific Gravity, Urine 1.004 (L) 1.005 - 1.030   pH 7.0 5.0 - 8.0   Glucose, UA NEGATIVE NEGATIVE mg/dL   Hgb urine dipstick SMALL (A) NEGATIVE   Bilirubin Urine NEGATIVE NEGATIVE   Ketones, ur NEGATIVE NEGATIVE mg/dL   Protein, ur NEGATIVE NEGATIVE mg/dL   Nitrite NEGATIVE NEGATIVE   Leukocytes,Ua NEGATIVE NEGATIVE   RBC / HPF 0-5 0 - 5 RBC/hpf   WBC, UA 0-5 0 - 5 WBC/hpf   Bacteria, UA RARE (A) NONE SEEN   Squamous Epithelial / LPF 0-5 0 - 5  Wet prep, genital     Status: None   Collection Time: 09/14/19 12:41 PM   Specimen: Cervix  Result Value Ref Range   Yeast Wet Prep HPF POC NONE SEEN NONE SEEN   Trich, Wet Prep NONE SEEN NONE SEEN   Clue Cells Wet Prep HPF POC NONE SEEN NONE SEEN   WBC, Wet Prep HPF POC NONE SEEN NONE SEEN   Sperm NONE SEEN     MDM UA Wet prep and gc/chlamydia  Patient requesting work note. States she feels like she is working too many hours. Instructed patient to discuss this at next prenatal visit. No reason to be written out of work at this visit today.   Assessment and Plan   1. Candidiasis of vulva   2. [redacted] weeks gestation of pregnancy    -Discharge home in stable condition -Rx for terazol sent to patient's pharmacy -Preterm labor precautions discussed -Patient advised to follow-up with  OB as scheduled for prenatal care -Patient may return to MAU as needed or if her condition were to change or worsen   Wende Mott CNM 09/14/2019, 12:40 PM

## 2019-09-14 NOTE — MAU Provider Note (Signed)
Chief Complaint  Patient presents with  . Vaginal Bleeding  . Vaginal Itching   History of Present Illness Brittney [name] is a 29 y.o. [phone] female with history of preterm labor at 23 and [redacted] wk EGA who presented to the MAU today at 29w5dEGA (based on LMP) with vaginal spotting and itching. Patient reports that she noticed some light pink on the toilet paper this morning when she went to wipe after using the bathroom. No gross vaginal bleeding, leakage of fluid, or contractions. Is having some white vaginal discharge that is not malodorous. Has had some vaginal itching consistent with vaginal yeast infection that was treated in MAU (5/27) with terazol with minimal improvement. No abdominal or pelvic pain. No dysuria, hematuria, changes in urinary frequency, fevers, chills, headaches, light-headedness, dizziness, nausea, vomiting, or constipation.  Of note, patient has history of preterm delivery x2. History of gestational diabetes mellitus, normal GTT earlier in pregnancy.   Pertinent Gynecological History: Menses: none 2/2 pregnancy Bleeding: light-pink vaginal spotting Contraception: none DES exposure: unknown Blood transfusions: none Sexually transmitted diseases: no past history Previous GYN Procedures: none  Last mammogram: unknown Last pap: abnormal: LGSIL Date: 05/2019   Past Medical History:  Diagnosis Date  . Headache   . Preterm labor     Past Surgical History:  Procedure Laterality Date  . NO PAST SURGERIES      Family History  Problem Relation Age of Onset  . Diabetes Other   . Hypertension Mother   . Hypertension Father   . Epilepsy Sister   . Epilepsy Brother   . Diabetes Maternal Aunt     Social History   Tobacco Use  . Smoking status: Never Smoker  . Smokeless tobacco: Never Used  Vaping Use  . Vaping Use: Never used  Substance Use Topics  . Alcohol use: No    Alcohol/week: 0.0 standard drinks  . Drug use: No    Allergies:  Allergies   Allergen Reactions  . Amoxicillin Hives    Medications Prior to Admission  Medication Sig Dispense Refill Last Dose  . aspirin 81 MG chewable tablet Chew 1 tablet (81 mg total) by mouth daily. 30 tablet 6   . Blood Pressure KIT 1 kit by Does not apply route daily. 1 kit 0   . Blood Pressure Monitoring (BLOOD PRESSURE KIT) DEVI 1 kit by Does not apply route once a week. Check Blood Pressure regularly and record readings into the Babyscripts App.  Large Cuff.  DX O90.0 1 each 0   . Elastic Bandages & Supports (COMFORT FIT MATERNITY SUPP LG) MISC Wear daily when ambulating 1 each 0   . loratadine (CLARITIN) 10 MG tablet Take 1 tablet (10 mg total) by mouth daily. 30 tablet 11   . Prenatal Vit-Fe Phos-FA-Omega (VITAFOL GUMMIES) 3.33-0.333-34.8 MG CHEW Chew 3 tablets by mouth daily before breakfast. 90 tablet 11   . terconazole (TERAZOL 7) 0.4 % vaginal cream Place 1 applicator vaginally at bedtime. Use for 7 days. 45 g 0     Review of Systems  Constitutional: Negative for chills, fatigue and fever.  HENT: Negative.   Eyes: Negative.   Respiratory: Negative for chest tightness and shortness of breath.   Cardiovascular: Negative for chest pain and palpitations.  Gastrointestinal: Negative for abdominal pain, blood in stool, diarrhea, nausea and vomiting.  Genitourinary: Negative for decreased urine volume, difficulty urinating, dysuria, flank pain, pelvic pain, urgency, vaginal discharge and vaginal pain.  Neurological: Negative for dizziness, light-headedness and  headaches.   Physical Exam Blood pressure 123/64, pulse 97, temperature 98.4 F (36.9 C), temperature source Oral, resp. rate 16, height '5\' 3"'  (1.6 m), weight 83.7 kg, last menstrual period 03/04/2019, SpO2 99 %, not currently breastfeeding. Physical Exam Constitutional:      General: She is awake.     Appearance: Normal appearance. She is not ill-appearing.  HENT:     Head: Normocephalic and atraumatic.     Mouth/Throat:      Mouth: Mucous membranes are moist.  Eyes:     Extraocular Movements: Extraocular movements intact.     Pupils: Pupils are equal, round, and reactive to light.  Cardiovascular:     Rate and Rhythm: Normal rate and regular rhythm.     Heart sounds: S1 normal and S2 normal. No murmur heard.  No friction rub. No gallop.   Pulmonary:     Effort: Pulmonary effort is normal.     Breath sounds: Normal breath sounds.  Abdominal:     General: Bowel sounds are normal.     Palpations: Abdomen is soft.     Tenderness: There is no abdominal tenderness. There is no right CVA tenderness or left CVA tenderness.  Genitourinary:    Vagina: Normal.     Cervix: Normal.     Comments: White discharge within vaginal canal. White superficial plaques on distal vagina. Skin:    General: Skin is warm and dry.     Capillary Refill: Capillary refill takes less than 2 seconds.     Findings: No rash.  Neurological:     Mental Status: She is alert and oriented to person, place, and time.  Psychiatric:        Mood and Affect: Mood and affect normal.     MAU Course Procedures and Labs: Results for orders placed or performed during the hospital encounter of 09/14/19 (from the past 24 hour(s))  Urinalysis, Routine w reflex microscopic Urine, Clean Catch     Status: Abnormal   Collection Time: 09/14/19 12:32 PM  Result Value Ref Range   Color, Urine STRAW (A) YELLOW   APPearance CLEAR CLEAR   Specific Gravity, Urine 1.004 (L) 1.005 - 1.030   pH 7.0 5.0 - 8.0   Glucose, UA NEGATIVE NEGATIVE mg/dL   Hgb urine dipstick SMALL (A) NEGATIVE   Bilirubin Urine NEGATIVE NEGATIVE   Ketones, ur NEGATIVE NEGATIVE mg/dL   Protein, ur NEGATIVE NEGATIVE mg/dL   Nitrite NEGATIVE NEGATIVE   Leukocytes,Ua NEGATIVE NEGATIVE   RBC / HPF 0-5 0 - 5 RBC/hpf   WBC, UA 0-5 0 - 5 WBC/hpf   Bacteria, UA RARE (A) NONE SEEN   Squamous Epithelial / LPF 0-5 0 - 5  Wet prep, genital     Status: None   Collection Time: 09/14/19 12:41  PM   Specimen: Cervix  Result Value Ref Range   Yeast Wet Prep HPF POC NONE SEEN NONE SEEN   Trich, Wet Prep NONE SEEN NONE SEEN   Clue Cells Wet Prep HPF POC NONE SEEN NONE SEEN   WBC, Wet Prep HPF POC NONE SEEN NONE SEEN   Sperm NONE SEEN     MDM Brittney [name] is a 29 y.o. [phone] female with history of preterm labor at 56 and [redacted] wk EGA who presented to the MAU today at 23w5dEGA (based on LMP) with vaginal spotting and itching. No gross vaginal bleeding, leakage of fluid, or contractions. Performed pelvic exam which demonstrated a closed cervix. Some white vaginal discharge  and white plaques on the distal vagina. Obtained NST, UA, wet prep, GC/Chlamydia. UA unremarkable, wet prep unremarkable, GC.Chlamydia pending. Likely etiology of vaginal spotting and itching is vaginal yeast infection. Reassuring patient is afebrile, reactive NST, no gross vaginal bleeding, contractions, or leakage of fluid.  Assessment and Plan  # Vaginal Yeast Infection - Terazol once daily at bedtime x 7 days. - Discussed with patient return precautions, including increased vaginal bleeding, new-onset pain/contractions, or leakage of fluids.  Nanetta Batty, Medical Student 09/14/2019 1:37 PM

## 2019-09-14 NOTE — MAU Note (Signed)
Brittney [name] is a 29 y.o. at [redacted]w[redacted]d here in MAU reporting: when using the bathroom this AM saw some pink spotting. States she has not seen anymore bleeding. Having some pelvic pressure and states baby feels lower. Denies pain. No LOF. Having some vaginal itching. +FM  Onset of complaint: today  Pain score: 0/10  Vitals:   09/14/19 1158  BP: 123/64  Pulse: 97  Resp: 16  Temp: 98.4 F (36.9 C)  SpO2: 99%     FHT: +FM  Lab orders placed from triage: UA

## 2019-09-16 LAB — GC/CHLAMYDIA PROBE AMP (~~LOC~~) NOT AT ARMC
Chlamydia: NEGATIVE
Comment: NEGATIVE
Comment: NORMAL
Neisseria Gonorrhea: NEGATIVE

## 2019-09-19 ENCOUNTER — Encounter: Payer: Self-pay | Admitting: Obstetrics & Gynecology

## 2019-09-19 ENCOUNTER — Ambulatory Visit (INDEPENDENT_AMBULATORY_CARE_PROVIDER_SITE_OTHER): Payer: Medicaid Other | Admitting: Obstetrics & Gynecology

## 2019-09-19 ENCOUNTER — Encounter: Payer: Self-pay | Admitting: Obstetrics

## 2019-09-19 ENCOUNTER — Other Ambulatory Visit: Payer: Medicaid Other

## 2019-09-19 ENCOUNTER — Other Ambulatory Visit: Payer: Self-pay

## 2019-09-19 VITALS — BP 117/74 | HR 102 | Wt 185.0 lb

## 2019-09-19 DIAGNOSIS — O09219 Supervision of pregnancy with history of pre-term labor, unspecified trimester: Secondary | ICD-10-CM

## 2019-09-19 DIAGNOSIS — Z3A28 28 weeks gestation of pregnancy: Secondary | ICD-10-CM

## 2019-09-19 DIAGNOSIS — Z3480 Encounter for supervision of other normal pregnancy, unspecified trimester: Secondary | ICD-10-CM

## 2019-09-19 DIAGNOSIS — Z23 Encounter for immunization: Secondary | ICD-10-CM | POA: Diagnosis not present

## 2019-09-19 DIAGNOSIS — O99013 Anemia complicating pregnancy, third trimester: Secondary | ICD-10-CM

## 2019-09-19 DIAGNOSIS — Z8632 Personal history of gestational diabetes: Secondary | ICD-10-CM | POA: Diagnosis not present

## 2019-09-19 DIAGNOSIS — D509 Iron deficiency anemia, unspecified: Secondary | ICD-10-CM

## 2019-09-19 NOTE — Progress Notes (Signed)
   PRENATAL VISIT NOTE  Subjective:  Brittney [name] is a 29 y.o. [phone] at [redacted]w[redacted]d being seen today for ongoing prenatal care.  She is currently monitored for the following issues for this low-risk pregnancy and has Eczema; Previous preterm delivery, antepartum; Supervision of normal pregnancy, antepartum; LGSIL on Pap smear of cervix; and History of diet controlled gestational diabetes mellitus (GDM) on their problem list.  Patient reports no complaints.  Contractions: Not present. Vag. Bleeding: None.  Movement: Present. Denies leaking of fluid.   The following portions of the patient's history were reviewed and updated as appropriate: allergies, current medications, past family history, past medical history, past social history, past surgical history and problem list.   Objective:   Vitals:   09/19/19 0910  BP: 117/74  Pulse: (!) 102  Weight: 185 lb (83.9 kg)    Fetal Status: Fetal Heart Rate (bpm): 149 Fundal Height: 28 cm Movement: Present     General:  Alert, oriented and cooperative. Patient is in no acute distress.  Skin: Skin is warm and dry. No rash noted.   Cardiovascular: Normal heart rate noted  Respiratory: Normal respiratory effort, no problems with respiration noted  Abdomen: Soft, gravid, appropriate for gestational age.  Pain/Pressure: Present     Pelvic: Cervical exam deferred        Extremities: Normal range of motion.  Edema: None  Mental Status: Normal mood and affect. Normal behavior. Normal judgment and thought content.   Assessment and Plan:  Pregnancy: [id number] at [redacted]w[redacted]d 1. History of diet controlled gestational diabetes mellitus (GDM) 2 hr GTT done today - Glucose Tolerance, 2 Hours w/1 Hour  2. Previous preterm deliveryat 35 weeks, antepartum Not on 17P. Doing well. PTL precautions revieweed.   3. [redacted] weeks gestation of pregnancy 4. Supervision of other normal pregnancy, antepartum - Glucose Tolerance, 2 Hours w/1 Hour - CBC - HIV antibody  (with reflex) - RPR - Tdap vaccine greater than or equal to 7yo IM Third trimester labs, Tdap today.  The patient was counseled on the potential benefits and lack of known risks of COVID vaccination, during pregnancy and breastfeeding, on today's visit. The patient's questions and concerns were addressed today. The patient is still unsure of her decision for vaccination.  Patient wanted work restrictions letter, this was given to her. Also considering BTL, Medicaid papers signed today. Preterm labor symptoms and general obstetric precautions including but not limited to vaginal bleeding, contractions, leaking of fluid and fetal movement were reviewed in detail with the patient. Please refer to After Visit Summary for other counseling recommendations.   Return in about 2 weeks (around 10/03/2019) for OFFICE OB Visit.  No future appointments.  Jaynie Collins, MD

## 2019-09-19 NOTE — Patient Instructions (Addendum)
Return to office for any scheduled appointments. Call the office or go to the MAU at [facility] if:  You begin to have strong, frequent contractions  Your water breaks.  Sometimes it is a big gush of fluid, sometimes it is just a trickle that keeps getting your panties wet or running down your legs  You have vaginal bleeding.  It is normal to have a small amount of spotting if your cervix was checked.   You do not feel your baby moving like normal.  If you do not, get something to eat and drink and lay down and focus on feeling your baby move.   If your baby is still not moving like normal, you should call the office or go to MAU.  Any other obstetric concerns.   Third Trimester of Pregnancy The third trimester is from week 28 through week 40 (months 7 through 9). The third trimester is a time when the unborn baby (fetus) is growing rapidly. At the end of the ninth month, the fetus is about 20 inches in length and weighs 6-10 pounds. Body changes during your third trimester Your body will continue to go through many changes during pregnancy. The changes vary from woman to woman. During the third trimester:  Your weight will continue to increase. You can expect to gain 25-35 pounds (11-16 kg) by the end of the pregnancy.  You may begin to get stretch marks on your hips, abdomen, and breasts.  You may urinate more often because the fetus is moving lower into your pelvis and pressing on your bladder.  You may develop or continue to have heartburn. This is caused by increased hormones that slow down muscles in the digestive tract.  You may develop or continue to have constipation because increased hormones slow digestion and cause the muscles that push waste through your intestines to relax.  You may develop hemorrhoids. These are swollen veins (varicose veins) in the rectum that can itch or be painful.  You may develop swollen, bulging veins (varicose veins)  in your legs.  You may have increased body aches in the pelvis, back, or thighs. This is due to weight gain and increased hormones that are relaxing your joints.  You may have changes in your hair. These can include thickening of your hair, rapid growth, and changes in texture. Some women also have hair loss during or after pregnancy, or hair that feels dry or thin. Your hair will most likely return to normal after your baby is born.  Your breasts will continue to grow and they will continue to become tender. A yellow fluid (colostrum) may leak from your breasts. This is the first milk you are producing for your baby.  Your belly button may stick out.  You may notice more swelling in your hands, face, or ankles.  You may have increased tingling or numbness in your hands, arms, and legs. The skin on your belly may also feel numb.  You may feel short of breath because of your expanding uterus.  You may have more problems sleeping. This can be caused by the size of your belly, increased need to urinate, and an increase in your body's metabolism.  You may notice the fetus "dropping," or moving lower in your abdomen (lightening).  You may have increased vaginal discharge.  You may notice your joints feel loose and you may have pain around your pelvic bone. What to expect at prenatal visits You will have   prenatal exams every 2 weeks until week 36. Then you will have weekly prenatal exams. During a routine prenatal visit:  You will be weighed to make sure you and the baby are growing normally.  Your blood pressure will be taken.  Your abdomen will be measured to track your baby's growth.  The fetal heartbeat will be listened to.  Any test results from the previous visit will be discussed.  You may have a cervical check near your due date to see if your cervix has softened or thinned (effaced).  You will be tested for Group B streptococcus. This happens between 35 and 37 weeks. Your  health care provider may ask you:  What your birth plan is.  How you are feeling.  If you are feeling the baby move.  If you have had any abnormal symptoms, such as leaking fluid, bleeding, severe headaches, or abdominal cramping.  If you are using any tobacco products, including cigarettes, chewing tobacco, and electronic cigarettes.  If you have any questions. Other tests or screenings that may be performed during your third trimester include:  Blood tests that check for low iron levels (anemia).  Fetal testing to check the health, activity level, and growth of the fetus. Testing is done if you have certain medical conditions or if there are problems during the pregnancy.  Nonstress test (NST). This test checks the health of your baby to make sure there are no signs of problems, such as the baby not getting enough oxygen. During this test, a belt is placed around your belly. The baby is made to move, and its heart rate is monitored during movement. What is false labor? False labor is a condition in which you feel small, irregular tightenings of the muscles in the womb (contractions) that usually go away with rest, changing position, or drinking water. These are called Braxton Hicks contractions. Contractions may last for hours, days, or even weeks before true labor sets in. If contractions come at regular intervals, become more frequent, increase in intensity, or become painful, you should see your health care provider. What are the signs of labor?  Abdominal cramps.  Regular contractions that start at 10 minutes apart and become stronger and more frequent with time.  Contractions that start on the top of the uterus and spread down to the lower abdomen and back.  Increased pelvic pressure and dull back pain.  A watery or bloody mucus discharge that comes from the vagina.  Leaking of amniotic fluid. This is also known as your "water breaking." It could be a slow trickle or a gush.  Let your health care provider know if it has a color or strange odor. If you have any of these signs, call your health care provider right away, even if it is before your due date. Follow these instructions at home: Medicines  Follow your health care provider's instructions regarding medicine use. Specific medicines may be either safe or unsafe to take during pregnancy.  Take a prenatal vitamin that contains at least 600 micrograms (mcg) of folic acid.  If you develop constipation, try taking a stool softener if your health care provider approves. Eating and drinking   Eat a balanced diet that includes fresh fruits and vegetables, whole grains, good sources of protein such as meat, eggs, or tofu, and low-fat dairy. Your health care provider will help you determine the amount of weight gain that is right for you.  Avoid raw meat and uncooked cheese. These carry germs that   can cause birth defects in the baby.  If you have low calcium intake from food, talk to your health care provider about whether you should take a daily calcium supplement.  Eat four or five small meals rather than three large meals a day.  Limit foods that are high in fat and processed sugars, such as fried and sweet foods.  To prevent constipation: ? Drink enough fluid to keep your urine clear or pale yellow. ? Eat foods that are high in fiber, such as fresh fruits and vegetables, whole grains, and beans. Activity  Exercise only as directed by your health care provider. Most women can continue their usual exercise routine during pregnancy. Try to exercise for 30 minutes at least 5 days a week. Stop exercising if you experience uterine contractions.  Avoid heavy lifting.  Do not exercise in extreme heat or humidity, or at high altitudes.  Wear low-heel, comfortable shoes.  Practice good posture.  You may continue to have sex unless your health care provider tells you otherwise. Relieving pain and  discomfort  Take frequent breaks and rest with your legs elevated if you have leg cramps or low back pain.  Take warm sitz baths to soothe any pain or discomfort caused by hemorrhoids. Use hemorrhoid cream if your health care provider approves.  Wear a good support bra to prevent discomfort from breast tenderness.  If you develop varicose veins: ? Wear support pantyhose or compression stockings as told by your healthcare provider. ? Elevate your feet for 15 minutes, 3-4 times a day. Prenatal care  Write down your questions. Take them to your prenatal visits.  Keep all your prenatal visits as told by your health care provider. This is important. Safety  Wear your seat belt at all times when driving.  Make a list of emergency phone numbers, including numbers for family, friends, the hospital, and police and fire departments. General instructions  Avoid cat litter boxes and soil used by cats. These carry germs that can cause birth defects in the baby. If you have a cat, ask someone to clean the litter box for you.  Do not travel far distances unless it is absolutely necessary and only with the approval of your health care provider.  Do not use hot tubs, steam rooms, or saunas.  Do not drink alcohol.  Do not use any products that contain nicotine or tobacco, such as cigarettes and e-cigarettes. If you need help quitting, ask your health care provider.  Do not use any medicinal herbs or unprescribed drugs. These chemicals affect the formation and growth of the baby.  Do not douche or use tampons or scented sanitary pads.  Do not cross your legs for long periods of time.  To prepare for the arrival of your baby: ? Take prenatal classes to understand, practice, and ask questions about labor and delivery. ? Make a trial run to the hospital. ? Visit the hospital and tour the maternity area. ? Arrange for maternity or paternity leave through employers. ? Arrange for family and  friends to take care of pets while you are in the hospital. ? Purchase a rear-facing car seat and make sure you know how to install it in your car. ? Pack your hospital bag. ? Prepare the baby's nursery. Make sure to remove all pillows and stuffed animals from the baby's crib to prevent suffocation.  Visit your dentist if you have not gone during your pregnancy. Use a soft toothbrush to brush your teeth and be   gentle when you floss. Contact a health care provider if:  You are unsure if you are in labor or if your water has broken.  You become dizzy.  You have mild pelvic cramps, pelvic pressure, or nagging pain in your abdominal area.  You have lower back pain.  You have persistent nausea, vomiting, or diarrhea.  You have an unusual or bad smelling vaginal discharge.  You have pain when you urinate. Get help right away if:  Your water breaks before 37 weeks.  You have regular contractions less than 5 minutes apart before 37 weeks.  You have a fever.  You are leaking fluid from your vagina.  You have spotting or bleeding from your vagina.  You have severe abdominal pain or cramping.  You have rapid weight loss or weight gain.  You have shortness of breath with chest pain.  You notice sudden or extreme swelling of your face, hands, ankles, feet, or legs.  Your baby makes fewer than 10 movements in 2 hours.  You have severe headaches that do not go away when you take medicine.  You have vision changes. Summary  The third trimester is from week 28 through week 40, months 7 through 9. The third trimester is a time when the unborn baby (fetus) is growing rapidly.  During the third trimester, your discomfort may increase as you and your baby continue to gain weight. You may have abdominal, leg, and back pain, sleeping problems, and an increased need to urinate.  During the third trimester your breasts will keep growing and they will continue to become tender. A yellow  fluid (colostrum) may leak from your breasts. This is the first milk you are producing for your baby.  False labor is a condition in which you feel small, irregular tightenings of the muscles in the womb (contractions) that eventually go away. These are called Braxton Hicks contractions. Contractions may last for hours, days, or even weeks before true labor sets in.  Signs of labor can include: abdominal cramps; regular contractions that start at 10 minutes apart and become stronger and more frequent with time; watery or bloody mucus discharge that comes from the vagina; increased pelvic pressure and dull back pain; and leaking of amniotic fluid. This information is not intended to replace advice given to you by your health care provider. Make sure you discuss any questions you have with your health care provider. Document Revised: 05/17/2018 Document Reviewed: 03/01/2016 Elsevier Patient Education  2020 [employer]   Postpartum Tubal Ligation Postpartum tubal ligation (PPTL) is a procedure to close the fallopian tubes. This is done so that you cannot get pregnant. When the fallopian tubes are closed, the eggs that the ovaries release cannot enter the uterus, and sperm cannot reach the eggs. PPTL is done right after childbirth or 1-2 days after childbirth, before the uterus returns to its normal location. If you have a cesarean section, it can be performed at the same time as the procedure. Having this done after childbirth does not make your stay in the hospital longer. PPTL is sometimes called "getting your tubes tied." You should not have this procedure if you want to get pregnant again or if you are unsure about having more children. Tell a health care provider about:  Any allergies you have.  All medicines you are taking, including vitamins, herbs, eye drops, creams, and over-the-counter medicines.  Any problems you or family members have had with anesthetic medicines.  Any blood  disorders you have.  Any surgeries you have had.  Any medical conditions you have or have had.  Any past pregnancies. What are the risks? Generally, this is a safe procedure. However, problems may occur, including:  Infection.  Bleeding.  Injury to other organs in the abdomen.  Side effects from anesthetic medicines.  Failure of the procedure. If this happens, you could get pregnant.  Having a fertilized egg attach outside the uterus (ectopic pregnancy). What happens before the procedure?  Ask your health care provider about: ? How much pain you can expect to have. ? What medicines you will be given for pain, especially if you are planning to breastfeed. What happens during the procedure? If you had a vaginal delivery:  You will be given one or more of the following: ? A medicine to help you relax (sedative). ? A medicine to numb the area (local anesthetic). ? A medicine to make you fall asleep (general anesthetic). ? A medicine that is injected into an area of your body to numb everything below the injection site (regional anesthetic).  If you have been given a general anesthetic, a tube will be put down your throat to help you breathe.  An IV will be inserted into one of your veins.  Your bladder may be emptied with a small tube (catheter).  An incision will be made just below your belly button.  Your fallopian tubes will be located and brought up through the incision.  Your fallopian tubes will be tied off, burned (cauterized), or blocked with a clip, ring, or clamp. A small part in the center of each fallopian tube may be removed.  The incision will be closed with stitches (sutures).  A bandage (dressing) will be placed over the incision. If you had a cesarean delivery:  Tubal ligation will be done through the incision that was used for the cesarean delivery of your baby.  The incision will be closed with sutures.  A dressing will be placed over the  incision. The procedure may vary among health care providers and hospitals. What happens after the procedure?  Your blood pressure, heart rate, breathing rate, and blood oxygen level will be monitored until you leave the hospital.  You will be given pain medicine as needed.  Do not drive for 24 hours if you were given a sedative during your procedure. Summary  Postpartum tubal ligation is a procedure that closes the fallopian tubes so you cannot get pregnant anymore.  This procedure is done while you are still in the hospital after childbirth. If you have a cesarean section, it can be performed at the same time.  Having this done after childbirth does not make your stay in the hospital longer.  Postpartum tubal ligation is considered permanent. You should not have this procedure if you want to get pregnant again or if you are unsure about having more children.  Talk to your health care provider to see if this procedure is right for you. This information is not intended to replace advice given to you by your health care provider. Make sure you discuss any questions you have with your health care provider. Document Revised: 07/09/2018 Document Reviewed: 12/14/2017 Elsevier Patient Education  2020 [employer].

## 2019-09-20 ENCOUNTER — Encounter: Payer: Self-pay | Admitting: Obstetrics & Gynecology

## 2019-09-20 DIAGNOSIS — O99013 Anemia complicating pregnancy, third trimester: Secondary | ICD-10-CM

## 2019-09-20 DIAGNOSIS — D509 Iron deficiency anemia, unspecified: Secondary | ICD-10-CM

## 2019-09-20 HISTORY — DX: Iron deficiency anemia, unspecified: D50.9

## 2019-09-20 HISTORY — DX: Anemia complicating pregnancy, third trimester: O99.013

## 2019-09-20 LAB — CBC
Hematocrit: 31.3 % — ABNORMAL LOW (ref 34.0–46.6)
Hemoglobin: 10.1 g/dL — ABNORMAL LOW (ref 11.1–15.9)
MCH: 28.1 pg (ref 26.6–33.0)
MCHC: 32.3 g/dL (ref 31.5–35.7)
MCV: 87 fL (ref 79–97)
Platelets: 337 10*3/uL (ref 150–450)
RBC: 3.59 x10E6/uL — ABNORMAL LOW (ref 3.77–5.28)
RDW: 13.2 % (ref 11.7–15.4)
WBC: 11.2 10*3/uL — ABNORMAL HIGH (ref 3.4–10.8)

## 2019-09-20 LAB — GLUCOSE TOLERANCE, 2 HOURS W/ 1HR
Glucose, 1 hour: 151 mg/dL (ref 65–179)
Glucose, 2 hour: 120 mg/dL (ref 65–152)
Glucose, Fasting: 84 mg/dL (ref 65–91)

## 2019-09-20 LAB — HIV ANTIBODY (ROUTINE TESTING W REFLEX): HIV Screen 4th Generation wRfx: NONREACTIVE

## 2019-09-20 LAB — RPR: RPR Ser Ql: NONREACTIVE

## 2019-09-20 MED ORDER — FERROUS SULFATE 325 (65 FE) MG PO TABS: 325.0000 mg | ORAL_TABLET | Freq: Two times a day (BID) | ORAL | 3 refills | Status: AC

## 2019-09-20 NOTE — Addendum Note (Signed)
Addended by: Jaynie Collins A on: 09/20/2019 11:40 AM   Modules accepted: Orders

## 2019-09-25 ENCOUNTER — Telehealth: Payer: Self-pay | Admitting: *Deleted

## 2019-09-25 NOTE — Telephone Encounter (Signed)
Spoke with pt regarding LOA paperwork.  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 Pt states she wants to go on leave if she can in order to be able to come back to her job after delivery.  Pt states she was just recently hired.    Pt made aware that she will not qualify for LOA if she was just hired, she may have to leave her job.  Advised that she may ask her employer for the ADA Accomodation forms and that can be completed by the office to advise restrictions in pregnancy. Advised pt to make office aware if/when she receives new forms and they can be completed.  Also advised pt discuss her work situation with her provider at her next appt.  Made aware that it will have to be stated in her chart if she has any specific work restrictions(hours/days/times).  Pt states understanding.

## 2019-10-03 ENCOUNTER — Other Ambulatory Visit: Payer: Self-pay

## 2019-10-03 ENCOUNTER — Ambulatory Visit (INDEPENDENT_AMBULATORY_CARE_PROVIDER_SITE_OTHER): Payer: Medicaid Other | Admitting: Women's Health

## 2019-10-03 VITALS — BP 119/75 | HR 103 | Wt 186.0 lb

## 2019-10-03 DIAGNOSIS — D509 Iron deficiency anemia, unspecified: Secondary | ICD-10-CM

## 2019-10-03 DIAGNOSIS — R87612 Low grade squamous intraepithelial lesion on cytologic smear of cervix (LGSIL): Secondary | ICD-10-CM

## 2019-10-03 DIAGNOSIS — Z3480 Encounter for supervision of other normal pregnancy, unspecified trimester: Secondary | ICD-10-CM

## 2019-10-03 DIAGNOSIS — O26843 Uterine size-date discrepancy, third trimester: Secondary | ICD-10-CM

## 2019-10-03 DIAGNOSIS — Z8632 Personal history of gestational diabetes: Secondary | ICD-10-CM

## 2019-10-03 DIAGNOSIS — O09219 Supervision of pregnancy with history of pre-term labor, unspecified trimester: Secondary | ICD-10-CM

## 2019-10-03 DIAGNOSIS — O99013 Anemia complicating pregnancy, third trimester: Secondary | ICD-10-CM

## 2019-10-03 NOTE — Patient Instructions (Addendum)
Maternity Assessment Unit (MAU)  The Maternity Assessment Unit (MAU) is located at the [facility] [place] [facility]. The address is: [address] [place], [place], [place] [address]. Please see map below for additional directions.    The Maternity Assessment Unit is designed to help you during your pregnancy, and for up to 6 weeks after delivery, with any pregnancy- or postpartum-related emergencies, if you think you are in labor, or if your water has broken. For example, if you experience nausea and vomiting, vaginal bleeding, severe abdominal or pelvic pain, elevated blood pressure or other problems related to your pregnancy or postpartum time, please come to the Maternity Assessment Unit for assistance.        Preterm Labor and Birth Information  The normal length of a pregnancy is 39-41 weeks. Preterm labor is when labor starts before 37 completed weeks of pregnancy. What are the risk factors for preterm labor? Preterm labor is more likely to occur in women who:  Have certain infections during pregnancy such as a bladder infection, sexually transmitted infection, or infection inside the uterus (chorioamnionitis).  Have a shorter-than-normal cervix.  Have gone into preterm labor before.  Have had surgery on their cervix.  Are younger than age 64 or older than age 26.  Are African American.  Are pregnant with twins or multiple babies (multiple gestation).  Take street drugs or smoke while pregnant.  Do not gain enough weight while pregnant.  Became pregnant shortly after having been pregnant. What are the symptoms of preterm labor? Symptoms of preterm labor include:  Cramps similar to those that can happen during a menstrual period. The cramps may happen with diarrhea.  Pain in the abdomen or lower back.  Regular uterine contractions that may feel like tightening of the abdomen.  A feeling of increased pressure in the  pelvis.  Increased watery or bloody mucus discharge from the vagina.  Water breaking (ruptured amniotic sac). Why is it important to recognize signs of preterm labor? It is important to recognize signs of preterm labor because babies who are born prematurely may not be fully developed. This can put them at an increased risk for:  Long-term (chronic) heart and lung problems.  Difficulty immediately after birth with regulating body systems, including blood sugar, body temperature, heart rate, and breathing rate.  Bleeding in the brain.  Cerebral palsy.  Learning difficulties.  Death. These risks are highest for babies who are born before 38 weeks of pregnancy. How is preterm labor treated? Treatment depends on the length of your pregnancy, your condition, and the health of your baby. It may involve:  Having a stitch (suture) placed in your cervix to prevent your cervix from opening too early (cerclage).  Taking or being given medicines, such as: ? Hormone medicines. These may be given early in pregnancy to help support the pregnancy. ? Medicine to stop contractions. ? Medicines to help mature the baby's lungs. These may be prescribed if the risk of delivery is high. ? Medicines to prevent your baby from developing cerebral palsy. If the labor happens before 34 weeks of pregnancy, you may need to stay in the hospital. What should I do if I think I am in preterm labor? If you think that you are going into preterm labor, call your health care provider right away. How can I prevent preterm labor in future pregnancies? To increase your chance of having a full-term pregnancy:  Do not use any tobacco products, such as  cigarettes, chewing tobacco, and e-cigarettes. If you need help quitting, ask your health care provider.  Do not use street drugs or medicines that have not been prescribed to you during your pregnancy.  Talk with your health care provider before taking any herbal  supplements, even if you have been taking them regularly.  Make sure you gain a healthy amount of weight during your pregnancy.  Watch for infection. If you think that you might have an infection, get it checked right away.  Make sure to tell your health care provider if you have gone into preterm labor before. This information is not intended to replace advice given to you by your health care provider. Make sure you discuss any questions you have with your health care provider. Document Revised: 05/18/2018 Document Reviewed: 06/17/2015 Elsevier Patient Education  2020 [employer].       Childbirth Education Options: [facility] Department Classes:  Childbirth education classes can help you get ready for a positive parenting experience. You can also meet other expectant parents and get free stuff for your baby. Each class runs for five weeks on the same night and costs $45 for the mother-to-be and her support person. Medicaid covers the cost if you are eligible. Call [phone] to register. [facility] Childbirth Education:  [phone] or [phone] or [email or web address]  Baby & Me Class: Discuss newborn & infant parenting and family adjustment issues with other new mothers in a relaxed environment. Each week brings a new speaker or baby-centered activity. We encourage new mothers to join [place] every Thursday at 11:00am. Babies birth until crawling. No registration or fee. Daddy [employer]: This course offers Dads-to-be the tools and knowledge needed to feel confident on their journey to becoming new fathers. Experienced dads, who have been trained as coaches, teach dads-to-be how to hold, comfort, diaper, swaddle and play with their infant while being able to support the new mom as well. A class for men taught by men. $25/dad Big Brother/Big Sister: Let your children share in the joy of a new brother or sister in this special class designed just for them. Class  includes discussion about how families care for babies: swaddling, holding, diapering, safety as well as how they can be helpful in their new role. This class is designed for children ages 2 to 58, but any age is welcome. Please register each child individually. $5/child  Mom Talk: This mom-led group offers support and connection to mothers as they journey through the adjustments and struggles of that sometimes overwhelming first year after the birth of a child. Tuesdays at 10:00am and Thursdays at 6:00pm. Babies welcome. No registration or fee. Breastfeeding Support Group: This group is a mother-to-mother support circle where moms have the opportunity to share their breastfeeding experiences. A Lactation Consultant is present for questions and concerns. Meets each Tuesday at 11:00am. No fee or registration. Breastfeeding Your Baby: Learn what to expect in the first days of breastfeeding your newborn.  This class will help you feel more confident with the skills needed to begin your breastfeeding experience. Many new mothers are concerned about breastfeeding after leaving the hospital. This class will also address the most common fears and challenges about breastfeeding during the first few weeks, months and beyond. (call for fee) Comfort Techniques and Tour: This 2 hour interactive class will provide you the opportunity to learn & practice hands-on techniques that can help relieve some of the discomfort of labor and encourage your baby to rotate toward the best  position for birth. You and your partner will be able to try a variety of labor positions with birth balls and rebozos as well as practice breathing, relaxation, and visualization techniques. A tour of the [facility] is included with this class. $20 per registrant and support person Childbirth Class- Weekend Option: This class is a Weekend version of our Birth & Baby series. It is designed for parents who have a difficult time  fitting several weeks of classes into their schedule. It covers the care of your newborn and the basics of labor and childbirth. It also includes a Maternity Care Center Tour of [facility] and lunch. The class is held two consecutive days: beginning on Friday evening from 6:30 - 8:30 p.m. and the next day, Saturday from 9 a.m. - 4 p.m. (call for fee) [facility] DolinWaterbirth Class: Interested in a waterbirth?  This informational class will help you discover whether waterbirth is the right fit for you. Education about waterbirth itself, supplies you would need and how to assemble your support team is what you can expect from this class. Some obstetrical practices require this class in order to pursue a waterbirth. (Not all obstetrical practices offer waterbirth-check with your healthcare provider.) Register only the expectant mom, but you are encouraged to bring your partner to class! Required if planning waterbirth, no fee. Infant/Child CPR: Parents, grandparents, babysitters, and friends learn Cardio-Pulmonary Resuscitation skills for infants and children. You will also learn how to treat both conscious and unconscious choking in infants and children. This Family & Friends program does not offer certification. Register each participant individually to ensure that enough mannequins are available. (Call for fee) Grandparent Love: Expecting a grandbaby? This class is for you! Learn about the latest infant care and safety recommendations and ways to support your own child as he or she transitions into the parenting role. Taught by Registered Nurses who are childbirth instructors, but most importantly...they are grandmothers too! $10/person. Childbirth Class- Natural Childbirth: This series of 5 weekly classes is for expectant parents who want to learn and practice natural methods of coping with the process of labor and childbirth. Relaxation, breathing, massage, visualization, role of the partner, and helpful positioning  are highlighted. Participants learn how to be confident in their body's ability to give birth. This class will empower and help parents make informed decisions about their own care. Includes discussion that will help new parents transition into the immediate postpartum period. Maternity Care Center Tour of [facility] is included. We suggest taking this class between 25-32 weeks, but it's only a recommendation. $75 per registrant and one support person or $30 Medicaid. Childbirth Class- 3 week Series: This option of 3 weekly classes helps you and your labor partner prepare for childbirth. Newborn care, labor & birth, cesarean birth, pain management, and comfort techniques are discussed and a Maternity Care Center Tour of [facility] is included. The class meets at the same time, on the same day of the week for 3 consecutive weeks beginning with the starting date you choose. $60 for registrant and one support person.  Marvelous Multiples: Expecting twins, triplets, or more? This class covers the differences in labor, birth, parenting, and breastfeeding issues that face multiples' parents. NICU tour is included. Led by a Certified Childbirth Educator who is the mother of twins. No fee. Caring for Baby: This class is for expectant and adoptive parents who want to learn and practice the most up-to-date newborn care for their babies. Focus is on birth  through the first six weeks of life. Topics include feeding, bathing, diapering, crying, umbilical cord care, circumcision care and safe sleep. Parents learn to recognize symptoms of illness and when to call the pediatrician. Register only the mom-to-be and your partner or support person can plan to come with you! $10 per registrant and support person Childbirth Class- online option: This online class offers you the freedom to complete a Birth and Baby series in the comfort of your own home. The flexibility of this option allows you to review sections at your  own pace, at times convenient to you and your support people. It includes additional video information, animations, quizzes, and extended activities. Get organized with helpful eClass tools, checklists, and trackers. Once you register online for the class, you will receive an email within a few days to accept the invitation and begin the class when the time is right for you. The content will be available to you for 60 days. $60 for 60 days of online access for you and your support people.       Pregnancy and Anemia  Anemia is a condition in which the concentration of red blood cells, or hemoglobin, in the blood is below normal. Hemoglobin is a substance in red blood cells that carries oxygen to the tissues of the body. Anemia results when enough oxygen does not reach these tissues. Anemia is common during pregnancy because the woman's body needs more blood volume and blood cells to provide nutrition to the fetus. The fetus needs iron and folic acid as it is developing. Your body may not produce enough red blood cells because of this. Also, during pregnancy, the liquid part of the blood (plasma) increases by about 30-50%, and the red blood cells increase by only 20%. This lowers the concentration of the red blood cells and creates a natural anemia-like situation. What are the causes? The most common cause of anemia during pregnancy is not having enough iron in the body to make red blood cells (iron deficiency anemia). Other causes may include:  Folic acid deficiency.  Vitamin B12 deficiency.  Certain prescription or over-the-counter medicines.  Certain medical conditions or infections that destroy red blood cells.  A low platelet count and bleeding caused by antibodies that go through the placenta to the fetus from the mother's blood. What are the signs or symptoms? Mild anemia may not be noticeable. If it becomes severe, symptoms may include:  Feeling tired (fatigue).  Shortness of  breath, especially during activity.  Weakness.  Fainting.  Pale looking skin.  Headaches.  A fast or irregular heartbeat (palpitations).  Dizziness. How is this diagnosed? This condition may be diagnosed based on:  Your medical history and a physical exam.  Blood tests. How is this treated? Treatment for anemia during pregnancy depends on the cause of the anemia. Treatment can include:  Dietary changes.  Supplements of iron, vitamin B12, or folic acid.  A blood transfusion. This may be needed if anemia is severe.  Hospitalization. This may be needed if there is a lot of blood loss or severe anemia. Follow these instructions at home:  Follow recommendations from your dietitian or health care provider about changing your diet.  Increase your vitamin C intake. This will help the stomach absorb more iron. Some foods that are high in vitamin C include: ? Oranges. ? Peppers. ? Tomatoes. ? Mangoes.  Eat a diet rich in iron. This would include foods such as: ? Liver. ? Beef. ? Eggs. ? Whole  grains. ? Spinach. ? Dried fruit.  Take iron and vitamins as told by your health care provider.  Eat green leafy vegetables. These are a good source of folic acid.  Keep all follow-up visits as told by your health care provider. This is important. Contact a health care provider if:  You have frequent or lasting headaches.  You look pale.  You bruise easily. Get help right away if:  You have extreme weakness, shortness of breath, or chest pain.  You become dizzy or have trouble concentrating.  You have heavy vaginal bleeding.  You develop a rash.  You have bloody or black, tarry stools.  You faint.  You vomit up blood.  You vomit repeatedly.  You have abdominal pain.  You have a fever.  You are dehydrated. Summary  Anemia is a condition in which the concentration of red blood cells or hemoglobin in the blood is below normal.  Anemia is common during  pregnancy because the woman's body needs more blood volume and blood cells to provide nutrition to the fetus.  The most common cause of anemia during pregnancy is not having enough iron in the body to make red blood cells (iron deficiency anemia).  Mild anemia may not be noticeable. If it becomes severe, symptoms may include feeling tired and weak. This information is not intended to replace advice given to you by your health care provider. Make sure you discuss any questions you have with your health care provider. Document Revised: 07/10/2018 Document Reviewed: 03/01/2016 Elsevier Patient Education  2020 [employer].

## 2019-10-03 NOTE — Progress Notes (Signed)
Subjective:  Brittney [name] is a 29 y.o. [phone] at [redacted]w[redacted]d being seen today for ongoing prenatal care.  She is currently monitored for the following issues for this low-risk pregnancy and has Eczema; Previous preterm delivery, antepartum; Supervision of normal pregnancy, antepartum; LGSIL on Pap smear of cervix; History of diet controlled gestational diabetes mellitus (GDM); and Maternal iron deficiency anemia complicating pregnancy in third trimester on their problem list.  Patient reports no complaints.  Contractions: Irritability. Vag. Bleeding: None.  Movement: Present. Denies leaking of fluid.   The following portions of the patient's history were reviewed and updated as appropriate: allergies, current medications, past family history, past medical history, past social history, past surgical history and problem list. Problem list updated.  Objective:   Vitals:   10/03/19 0911  BP: 119/75  Pulse: (!) 103  Weight: 186 lb (84.4 kg)    Fetal Status: Fetal Heart Rate (bpm): 134 Fundal Height: 36 cm Movement: Present     General:  Alert, oriented and cooperative. Patient is in no acute distress.  Skin: Skin is warm and dry. No rash noted.   Cardiovascular: Normal heart rate noted  Respiratory: Normal respiratory effort, no problems with respiration noted  Abdomen: Soft, gravid, appropriate for gestational age. Pain/Pressure: Present     Pelvic: Vag. Bleeding: None     Cervical exam deferred        Extremities: Normal range of motion.  Edema: None  Mental Status: Normal mood and affect. Normal behavior. Normal judgment and thought content.   Urinalysis:      Assessment and Plan:  Pregnancy: [id number] at [redacted]w[redacted]d  1. Supervision of other normal pregnancy, antepartum -info on CBE given  2. Maternal iron deficiency anemia complicating pregnancy in third trimester -pt on oral iron since 09/20/2019  3. History of diet controlled gestational diabetes mellitus (GDM) -normal 2hr this  pregnancy  4. LGSIL on Pap smear of cervix -05/2019, neg HPV,colpo pp per Dr. Clearance Coots  5. Previous preterm delivery, antepartum  6. Uterine size-date discrepancy in third trimester - FH 33 - [place] MFM OB FOLLOW UP; Future   Preterm labor symptoms and general obstetric precautions including but not limited to vaginal bleeding, contractions, leaking of fluid and fetal movement were reviewed in detail with the patient. I discussed the assessment and treatment plan with the patient. The patient was provided an opportunity to ask questions and all were answered. The patient agreed with the plan and demonstrated an understanding of the instructions. The patient was advised to call back or seek an in-person office evaluation/go to MAU at [facility] for any urgent or concerning symptoms. Please refer to After Visit Summary for other counseling recommendations.  Return in about 2 weeks (around 10/17/2019) for virtual LOB/APP OK/needs growth [place] ASAP.   Brittney [name], Odie Sera, NP

## 2019-10-03 NOTE — Progress Notes (Signed)
ROB   CC: NONE   

## 2019-10-09 ENCOUNTER — Ambulatory Visit: Payer: Medicaid Other

## 2019-10-09 ENCOUNTER — Ambulatory Visit: Payer: Medicaid Other | Attending: Women's Health

## 2019-10-09 ENCOUNTER — Other Ambulatory Visit: Payer: Self-pay

## 2019-10-09 ENCOUNTER — Ambulatory Visit (HOSPITAL_COMMUNITY)
Admission: EM | Admit: 2019-10-09 | Discharge: 2019-10-09 | Disposition: A | Discharge status: Home / Self Care | Payer: Medicaid Other

## 2019-10-09 DIAGNOSIS — O26843 Uterine size-date discrepancy, third trimester: Secondary | ICD-10-CM

## 2019-10-09 DIAGNOSIS — O09293 Supervision of pregnancy with other poor reproductive or obstetric history, third trimester: Secondary | ICD-10-CM

## 2019-10-09 DIAGNOSIS — Z362 Encounter for other antenatal screening follow-up: Secondary | ICD-10-CM | POA: Diagnosis not present

## 2019-10-09 DIAGNOSIS — O322XX0 Maternal care for transverse and oblique lie, not applicable or unspecified: Secondary | ICD-10-CM

## 2019-10-09 DIAGNOSIS — Z3A31 31 weeks gestation of pregnancy: Secondary | ICD-10-CM | POA: Diagnosis not present

## 2019-10-09 DIAGNOSIS — O09213 Supervision of pregnancy with history of pre-term labor, third trimester: Secondary | ICD-10-CM

## 2019-10-16 DIAGNOSIS — Z3A32 32 weeks gestation of pregnancy: Secondary | ICD-10-CM

## 2019-10-16 HISTORY — DX: 32 weeks gestation of pregnancy: Z3A.32

## 2019-10-17 ENCOUNTER — Telehealth: Payer: Self-pay

## 2019-10-17 ENCOUNTER — Telehealth (INDEPENDENT_AMBULATORY_CARE_PROVIDER_SITE_OTHER): Payer: Medicaid Other

## 2019-10-17 DIAGNOSIS — Z3A32 32 weeks gestation of pregnancy: Secondary | ICD-10-CM

## 2019-10-17 NOTE — Progress Notes (Signed)
Attempted to call x 2, per Janene Madeira, MA without answer. Patient to be rescheduled accordingly.  Cherre Robins MSN, CNM Advanced Practice Provider, [facility] [employer]

## 2019-10-17 NOTE — Telephone Encounter (Signed)
Unable to reach pt x2 for virtual ROB visit  LVM for pt to c/b  

## 2019-10-21 ENCOUNTER — Telehealth (INDEPENDENT_AMBULATORY_CARE_PROVIDER_SITE_OTHER): Payer: Medicaid Other | Admitting: Nurse Practitioner

## 2019-10-21 ENCOUNTER — Encounter: Payer: Self-pay | Admitting: Nurse Practitioner

## 2019-10-21 VITALS — BP 121/79 | HR 119

## 2019-10-21 DIAGNOSIS — Z3A33 33 weeks gestation of pregnancy: Secondary | ICD-10-CM

## 2019-10-21 DIAGNOSIS — D509 Iron deficiency anemia, unspecified: Secondary | ICD-10-CM

## 2019-10-21 DIAGNOSIS — O09293 Supervision of pregnancy with other poor reproductive or obstetric history, third trimester: Secondary | ICD-10-CM

## 2019-10-21 DIAGNOSIS — Z8632 Personal history of gestational diabetes: Secondary | ICD-10-CM

## 2019-10-21 DIAGNOSIS — Z3480 Encounter for supervision of other normal pregnancy, unspecified trimester: Secondary | ICD-10-CM

## 2019-10-21 DIAGNOSIS — O99013 Anemia complicating pregnancy, third trimester: Secondary | ICD-10-CM

## 2019-10-21 NOTE — Progress Notes (Signed)
OBSTETRICS PRENATAL VIRTUAL VISIT ENCOUNTER NOTE  Provider location: Center for [facility]   I connected with Brittney [name] on 10/21/19 at  1:40 PM EDT by MyChart Video Encounter at home and verified that I am speaking with the correct person using two identifiers.   I discussed the limitations, risks, security and privacy concerns of performing an evaluation and management service virtually and the availability of in person appointments. I also discussed with the patient that there may be a patient responsible charge related to this service. The patient expressed understanding and agreed to proceed. Subjective:  Brittney [name] is a 29 y.o. [phone] at [redacted]w[redacted]d being seen today for ongoing prenatal care.  She is currently monitored for the following issues for this low-risk pregnancy and has Eczema; Previous preterm delivery, antepartum; Supervision of normal pregnancy, antepartum; LGSIL on Pap smear of cervix; History of diet controlled gestational diabetes mellitus (GDM); Maternal iron deficiency anemia complicating pregnancy in third trimester; and [redacted] weeks gestation of pregnancy on their problem list.  Patient reports vaginal itching and is ready for baby to come.  Contractions: Not present. Vag. Bleeding: None.  Movement: Present. Denies any leaking of fluid.   The following portions of the patient's history were reviewed and updated as appropriate: allergies, current medications, past family history, past medical history, past social history, past surgical history and problem list.   Objective:   Vitals:   10/21/19 1339  BP: 121/79  Pulse: (!) 119    Fetal Status:     Movement: Present     General:  Alert, oriented and cooperative. Patient is in no acute distress.  Respiratory: Normal respiratory effort, no problems with respiration noted  Mental Status: Normal mood and affect. Normal behavior. Normal judgment and thought content.  Rest of physical exam  deferred due to type of encounter  Imaging: [place] MFM OB FOLLOW UP  Result Date: 10/09/2019 ----------------------------------------------------------------------  OBSTETRICS REPORT                       (Signed Final 10/09/2019 09:09 am) ---------------------------------------------------------------------- Patient Info  ID #:       [id number]                          D.O.B.:  [birth year] (29 yrs)  Name:       Brittney [name]            Visit Date: 10/09/2019 07:47 am ---------------------------------------------------------------------- Performed By  Attending:        Lin Landsman      Ref. Address:     [address]                    [address]                                                             [address]  [address]                                                             [place]                                                             [address]  Performed By:     Truitt Leep,      [facility]  Referred By:      [facility] ---------------------------------------------------------------------- Orders  #  Description                           Code        Ordered By  1  [place] MFM OB FOLLOW UP                   [id number]    NICOLE NUGENT ----------------------------------------------------------------------  #  Order #                     Accession #                Episode #  1  [id number]                   [id number]                 [id number] ---------------------------------------------------------------------- Indications  Uterine size-date discrepancy, third trimester O26.843  Poor obstetric history: Previous preterm       O09.219  delivery, antepartum (36 weeks)  Poor obstetric history: Previous gestational    O09.299  diabetes  Antenatal follow-up for nonvisualized fetal    Z36.2  anatomy (low risk NIPS)  [redacted] weeks gestation of pregnancy                Z3A.31 ---------------------------------------------------------------------- Fetal Evaluation  Num Of Fetuses:         1  Cardiac Activity:       Observed  Presentation:           Transverse, head to maternal left  Placenta:               Fundal  P. Cord Insertion:      Visualized, central  Amniotic Fluid  AFI FV:      Within normal limits  AFI Sum(cm)     %Tile       Largest Pocket(cm)  18.77           71          8.73  RUQ(cm)       RLQ(cm)       LUQ(cm)        LLQ(cm)  4.1           8.73          1.05           4.89 ---------------------------------------------------------------------- Biometry  BPD:        80  mm     G. Age:  32w 1d         66  %    CI:         78.3   %    70 - 86                                                          FL/HC:      21.0   %    19.3 - 21.3  HC:       286   mm     G. Age:  31w 3d         18  %    HC/AC:      0.99        0.96 - 1.17  AC:       288   mm     G. Age:  32w 6d         87  %    FL/BPD:     75.0   %    71 - 87  FL:         60  mm     G. Age:  31w 2d         34  %    FL/AC:      20.8   %    20 - 24  LV:        3.3  mm  Est. FW:    1911  gm      4 lb 3 oz     67  % ---------------------------------------------------------------------- OB History  Blood Type:   O+  Gravidity:    7         Term:   1        Prem:   2        SAB:   2  TOP:          0       Ectopic:  1        Living: 3 ---------------------------------------------------------------------- Gestational Age  LMP:           31w 2d        Date:  03/04/19                 EDD:  12/09/19  U/S Today:     32w 0d                                        EDD:   12/04/19  Best:          31w 2d     Det. By:  LMP  (03/04/19)          EDD:   12/09/19 ---------------------------------------------------------------------- Anatomy  Cranium:               Appears normal         Ductal  Arch:            Appears normal  Cavum:                 Appears normal         Diaphragm:              Appears normal  Ventricles:            Appears normal         Stomach:                Appears normal, left                                                                        sided  Choroid Plexus:        Appears normal         Abdomen:                Appears normal  Cerebellum:            Appears normal         Abdominal Wall:         Appears nml (cord                                                                        insert, abd wall)  Posterior Fossa:       Appears normal         Cord Vessels:           Appears normal (3                                                                        vessel cord)  Nuchal Fold:           Not applicable (>20    Kidneys:                Appear normal  wks GA)  Thoracic:              Appears normal         Bladder:                Appears normal  Heart:                 Appears normal         Spine:                  Previously seen                         (4CH, axis, and                         situs)  RVOT:                  Appears normal         Upper Extremities:      Previously seen  LVOT:                  Appears normal         Lower Extremities:      Previously seen  Aortic Arch:           Appears normal ---------------------------------------------------------------------- Cervix Uterus Adnexa  Cervix  Length:           3.93  cm.  Normal appearance by transabdominal scan. ---------------------------------------------------------------------- Impression  Follow up growth due to size and dates discrepancy  Normal interval growth with good fetal movement and  amniotic fluid volume  Growth is appropriate for dates. ---------------------------------------------------------------------- Recommendations  Follow up as clinically indicated. ----------------------------------------------------------------------               Lin Landsmanorenthian Booker, MD  Electronically Signed Final Report   10/09/2019 09:09 am ----------------------------------------------------------------------   Assessment and Plan:  Pregnancy: [id number] at 7547w0d 1. Supervision of other normal pregnancy, antepartum Doing well.  Baby moving well. Continues to have some vaginal and vulvar itching that has not been identified as BV or yeast.  No odor.  Possibly because of warm summer weather.  Advised drying well after bathing and using hair dryer on cool setting to fully dry.  Call if itching is worsening. Advised that best time for birth is at 37 weeks - earlier is considered preterm - client had one preterm birth at 2035 weeks and baby went home with her so she is wondering if this baby will come early.  2. Maternal iron deficiency anemia complicating pregnancy in third trimester   3. History of diet controlled gestational diabetes mellitus (GDM)   Preterm labor symptoms and general obstetric precautions including but not limited to vaginal bleeding, contractions, leaking of fluid and fetal movement were reviewed in detail with the patient. I discussed the assessment and treatment plan with the patient. The patient was provided an opportunity to ask questions and all were answered. The patient agreed with the plan and demonstrated an understanding of the instructions. The patient was advised to call back or seek an in-person office evaluation/[facility] for any urgent or concerning symptoms. Please refer to After Visit Summary for other counseling recommendations.   I provided 8 minutes of face-to-face time during this encounter.  Return in about 3 weeks (around 11/11/2019) for in person ROB for vaginal swabs.  No future appointments. at time  visit note was closed.  Currie Paris, NP Center for [employer], [facility]

## 2019-10-21 NOTE — Progress Notes (Signed)
S/w pt for virtual visit, pt reports fetal movement, denies pain. 

## 2019-11-06 ENCOUNTER — Inpatient Hospital Stay (HOSPITAL_COMMUNITY)
Admission: AD | Admit: 2019-11-06 | Discharge: 2019-11-06 | Disposition: A | Discharge status: Home / Self Care | Payer: Medicaid Other | Attending: Family Medicine | Admitting: Family Medicine

## 2019-11-06 ENCOUNTER — Other Ambulatory Visit: Payer: Self-pay

## 2019-11-06 ENCOUNTER — Encounter (HOSPITAL_COMMUNITY): Payer: Self-pay | Admitting: Family Medicine

## 2019-11-06 DIAGNOSIS — O4703 False labor before 37 completed weeks of gestation, third trimester: Secondary | ICD-10-CM | POA: Insufficient documentation

## 2019-11-06 DIAGNOSIS — O09213 Supervision of pregnancy with history of pre-term labor, third trimester: Secondary | ICD-10-CM | POA: Insufficient documentation

## 2019-11-06 DIAGNOSIS — O4693 Antepartum hemorrhage, unspecified, third trimester: Secondary | ICD-10-CM | POA: Diagnosis present

## 2019-11-06 DIAGNOSIS — Z3A35 35 weeks gestation of pregnancy: Secondary | ICD-10-CM | POA: Diagnosis not present

## 2019-11-06 DIAGNOSIS — Z7982 Long term (current) use of aspirin: Secondary | ICD-10-CM | POA: Diagnosis not present

## 2019-11-06 LAB — URINALYSIS, ROUTINE W REFLEX MICROSCOPIC
Bilirubin Urine: NEGATIVE
Glucose, UA: NEGATIVE mg/dL
Hgb urine dipstick: NEGATIVE
Ketones, ur: NEGATIVE mg/dL
Nitrite: NEGATIVE
Protein, ur: NEGATIVE mg/dL
Specific Gravity, Urine: 1.012 (ref 1.005–1.030)
pH: 6 (ref 5.0–8.0)

## 2019-11-06 LAB — WET PREP, GENITAL
Sperm: NONE SEEN
Trich, Wet Prep: NONE SEEN
Yeast Wet Prep HPF POC: NONE SEEN

## 2019-11-06 LAB — GC/CHLAMYDIA PROBE AMP (~~LOC~~) NOT AT ARMC
Chlamydia: NEGATIVE
Comment: NEGATIVE
Comment: NORMAL
Neisseria Gonorrhea: NEGATIVE

## 2019-11-06 MED ORDER — LACTATED RINGERS IV BOLUS: 500.0000 mL | Freq: Once | INTRAVENOUS | Status: DC

## 2019-11-06 MED ORDER — BETAMETHASONE SOD PHOS & ACET 6 (3-3) MG/ML IJ SUSP
12.0000 mg | INTRAMUSCULAR | Status: DC
  Administered 2019-11-06: 12 mg via INTRAMUSCULAR
  Filled 2019-11-06: qty 5

## 2019-11-06 MED ORDER — NIFEDIPINE 10 MG PO CAPS
10.0000 mg | ORAL_CAPSULE | ORAL | Status: AC
  Administered 2019-11-06: 10 mg via ORAL
  Filled 2019-11-06: qty 1

## 2019-11-06 MED ORDER — LACTATED RINGERS IV BOLUS: 1000.0000 mL | Freq: Once | INTRAVENOUS | Status: DC

## 2019-11-06 NOTE — Discharge Instructions (Signed)
It is important that you return to the Maternity Assessment Unit on Thursday, September 30th in the morning for your second dose of betamethasone to assist with fetal lung maturity. Please present sooner if concern of decreased fetal movement, increasing intensity of contractions, vaginal bleeding, loss of fluid or other concerns.  Braxton Hicks Contractions Contractions of the uterus can occur throughout pregnancy, but they are not always a sign that you are in labor. You may have practice contractions called Braxton Hicks contractions. These false labor contractions are sometimes confused with true labor. What are Deberah Pelton contractions? Braxton Hicks contractions are tightening movements that occur in the muscles of the uterus before labor. Unlike true labor contractions, these contractions do not result in opening (dilation) and thinning of the cervix. Toward the end of pregnancy (32-34 weeks), Braxton Hicks contractions can happen more often and may become stronger. These contractions are sometimes difficult to tell apart from true labor because they can be very uncomfortable. You should not feel embarrassed if you go to the hospital with false labor. Sometimes, the only way to tell if you are in true labor is for your health care provider to look for changes in the cervix. The health care provider will do a physical exam and may monitor your contractions. If you are not in true labor, the exam should show that your cervix is not dilating and your water has not broken. If there are no other health problems associated with your pregnancy, it is completely safe for you to be sent home with false labor. You may continue to have Braxton Hicks contractions until you go into true labor. How to tell the difference between true labor and false labor True labor  Contractions last 30-70 seconds.  Contractions become very regular.  Discomfort is usually felt in the top of the uterus, and it spreads to  the lower abdomen and low back.  Contractions do not go away with walking.  Contractions usually become more intense and increase in frequency.  The cervix dilates and gets thinner. False labor  Contractions are usually shorter and not as strong as true labor contractions.  Contractions are usually irregular.  Contractions are often felt in the front of the lower abdomen and in the groin.  Contractions may go away when you walk around or change positions while lying down.  Contractions get weaker and are shorter-lasting as time goes on.  The cervix usually does not dilate or become thin. Follow these instructions at home:   Take over-the-counter and prescription medicines only as told by your health care provider.  Keep up with your usual exercises and follow other instructions from your health care provider.  Eat and drink lightly if you think you are going into labor.  If Braxton Hicks contractions are making you uncomfortable: ? Change your position from lying down or resting to walking, or change from walking to resting. ? Sit and rest in a tub of warm water. ? Drink enough fluid to keep your urine pale yellow. Dehydration may cause these contractions. ? Do slow and deep breathing several times an hour.  Keep all follow-up prenatal visits as told by your health care provider. This is important. Contact a health care provider if:  You have a fever.  You have continuous pain in your abdomen. Get help right away if:  Your contractions become stronger, more regular, and closer together.  You have fluid leaking or gushing from your vagina.  You pass blood-tinged mucus (bloody show).  You have bleeding from your vagina.  You have low back pain that you never had before.  You feel your baby's head pushing down and causing pelvic pressure.  Your baby is not moving inside you as much as it used to. Summary  Contractions that occur before labor are called Braxton  Hicks contractions, false labor, or practice contractions.  Braxton Hicks contractions are usually shorter, weaker, farther apart, and less regular than true labor contractions. True labor contractions usually become progressively stronger and regular, and they become more frequent.  Manage discomfort from [facility] contractions by changing position, resting in a warm bath, drinking plenty of water, or practicing deep breathing. This information is not intended to replace advice given to you by your health care provider. Make sure you discuss any questions you have with your health care provider. Document Revised: 01/06/2017 Document Reviewed: 06/09/2016 Elsevier Patient Education  2020 [employer].

## 2019-11-06 NOTE — MAU Note (Signed)
PT SAYS HAD BRIGHT RED BLOOD ON TP  BEFORE SHOWER . LAST SEX - MAY.  PNC - WITH [facility]

## 2019-11-06 NOTE — MAU Provider Note (Signed)
Chief Complaint:  Vaginal Bleeding  HPI: Brittney [name] is a 29 y.o. [id number] at 55w2dby L/6 who presents to maternity admissions reporting concern of bloody show. Pt reports that she went to restroom prior to going to shower at approximately 2330 last night and noted "specs of blood on toilet tissue x4". Appeared "dark pink" in color. Pt also reports mild abdominal pressure. Yesterday had some vaginal discharge with "little pieces of mucus". No medications other than prenatal vitamin. No substance use reported.  Pt reports good fetal movement, denies LOF, vaginal itching/burning, urinary symptoms, h/a, dizziness, n/v, or fever/chills.    HPI  Past Medical History: Past Medical History:  Diagnosis Date  . Headache   . Preterm labor     Past obstetric history: OB History  Gravida Para Term Preterm AB Living  '7 3 1 2 3 3  ' SAB TAB Ectopic Multiple Live Births  2   1 0 3    # Outcome Date GA Lbr Len/2nd Weight Sex Delivery Anes PTL Lv  7 Current           6 SAB 12/11/17          5 Preterm 10/29/17 383w4d1:13 / 00:04 2999 g F Vag-Spont None  LIV  4 SAB 06/01/16 119w0d      3 Ectopic 02/19/15 5w591w5dECTOPIC     2 Term 07/12/12 37w065w0d6 / 00:07 3135 g M Vag-Spont None  LIV  1 Preterm 10/11/05 25w0d34w0d g M Vag-Spont EPI  LIV     Birth Comments: No complications    Past Surgical History: Past Surgical History:  Procedure Laterality Date  . NO PAST SURGERIES      Family History: Family History  Problem Relation Age of Onset  . Diabetes Other   . Hypertension Mother   . Hypertension Father   . Epilepsy Sister   . Epilepsy Brother   . Diabetes Maternal Aunt     Social History: Social History   Tobacco Use  . Smoking status: Never Smoker  . Smokeless tobacco: Never Used  Vaping Use  . Vaping Use: Never used  Substance Use Topics  . Alcohol use: No    Alcohol/week: 0.0 standard drinks  . Drug use: No    Allergies:  Allergies  Allergen Reactions  .  Amoxicillin Hives    Meds:  No medications prior to admission.    ROS:  Review of Systems  Constitutional: Negative for activity change, chills and fever.  HENT: Negative for congestion and sore throat.   Eyes: Negative for photophobia and visual disturbance.  Respiratory: Negative for cough and shortness of breath.   Cardiovascular: Negative for chest pain and leg swelling.  Gastrointestinal: Negative for abdominal pain, nausea and vomiting.  Genitourinary: Positive for vaginal discharge. Negative for difficulty urinating, frequency, urgency, vaginal bleeding and vaginal pain.  Neurological: Negative for light-headedness and headaches.   I have reviewed patient's Past Medical Hx, Surgical Hx, Family Hx, Social Hx, medications and allergies.   Physical Exam   Patient Vitals for the past 24 hrs:  BP Temp Temp src Pulse Resp Height Weight  11/06/19 0632 114/67 -- -- 97 -- -- --  11/06/19 0448 125/70 -- -- -- -- -- --  11/06/19 0128 109/64 98.4 F (36.9 C) Oral (!) 104 20 '5\' 3"'  (1.6 m) 85.9 kg   Constitutional: Well-developed, well-nourished female in no acute distress. Sitting up comfortably on stretcher. Cardiovascular: normal rate Respiratory:  normal effort GI: Abd soft, non-tender, gravid appropriate for gestational age.  MS: Extremities nontender, no edema, normal ROM Neurologic: Alert and oriented x 4.  GU: Neg CVAT.  PELVIC EXAM: Cervix pink, visually closed, without lesion, scant white creamy discharge, vaginal walls and external genitalia normal Bimanual exam: 1/thick/high > 2/50/-1 > 2/50/-1  Dilation: 2 Effacement (%): 50 Station: -1 Presentation: Vertex Exam by:: DR Unnamed Hino  FHT:  Baseline 135, moderate variability, accelerations present, no decelerations Contractions: q 6-7 mins   Labs: Results for orders placed or performed during the hospital encounter of 11/06/19 (from the past 24 hour(s))  Wet prep, genital     Status: Abnormal   Collection Time:  11/06/19  2:09 AM  Result Value Ref Range   Yeast Wet Prep HPF POC NONE SEEN NONE SEEN   Trich, Wet Prep NONE SEEN NONE SEEN   Clue Cells Wet Prep HPF POC PRESENT (A) NONE SEEN   WBC, Wet Prep HPF POC FEW (A) NONE SEEN   Sperm NONE SEEN   Urinalysis, Routine w reflex microscopic Urine, Clean Catch     Status: Abnormal   Collection Time: 11/06/19  2:36 AM  Result Value Ref Range   Color, Urine YELLOW YELLOW   APPearance HAZY (A) CLEAR   Specific Gravity, Urine 1.012 1.005 - 1.030   pH 6.0 5.0 - 8.0   Glucose, UA NEGATIVE NEGATIVE mg/dL   Hgb urine dipstick NEGATIVE NEGATIVE   Bilirubin Urine NEGATIVE NEGATIVE   Ketones, ur NEGATIVE NEGATIVE mg/dL   Protein, ur NEGATIVE NEGATIVE mg/dL   Nitrite NEGATIVE NEGATIVE   Leukocytes,Ua TRACE (A) NEGATIVE   RBC / HPF 0-5 0 - 5 RBC/hpf   WBC, UA 0-5 0 - 5 WBC/hpf   Bacteria, UA RARE (A) NONE SEEN   Squamous Epithelial / LPF 6-10 0 - 5   Mucus PRESENT    O/Positive/-- (04/30 0927)  Imaging:  [place] MFM OB FOLLOW UP  Result Date: 10/09/2019 ----------------------------------------------------------------------  OBSTETRICS REPORT                       (Signed Final 10/09/2019 09:09 am) ---------------------------------------------------------------------- Patient Info  ID #:       [id number]                          D.O.B.:  December 31, 1990 (29 yrs)  Name:       Brittney [name]            Visit Date: 10/09/2019 07:47 am ---------------------------------------------------------------------- Performed By  Attending:        Sander Nephew      Ref. Address:     [address]  Performed By:     Claudia Desanctis,      [facility]                                                             [facility]  Referred By:      [facility] ---------------------------------------------------------------------- Orders  #  Description                           Code        Ordered By  1  [place] MFM OB FOLLOW UP                   [id number]    NICOLE NUGENT ----------------------------------------------------------------------  #  Order #                     Accession #                Episode #  1  [id number]                   [id number]                 [id number] ---------------------------------------------------------------------- Indications  Uterine size-date discrepancy, third trimester O26.843  Poor obstetric history: Previous preterm       O09.219  delivery, antepartum (36 weeks)  Poor obstetric history: Previous gestational   O09.299  diabetes  Antenatal follow-up for nonvisualized fetal    Z36.2  anatomy (low risk NIPS)  [redacted] weeks gestation of pregnancy                Z3A.31 ---------------------------------------------------------------------- Fetal Evaluation  Num Of Fetuses:         1  Cardiac Activity:       Observed  Presentation:           Transverse, head to maternal left  Placenta:               Fundal  P. Cord Insertion:      Visualized, central  Amniotic Fluid  AFI FV:      Within normal limits  AFI Sum(cm)     %Tile       Largest Pocket(cm)  18.77           71          8.73  RUQ(cm)       RLQ(cm)       LUQ(cm)        LLQ(cm)  4.1           8.73          1.05           4.89 ---------------------------------------------------------------------- Biometry  BPD:        80  mm     G. Age:  32w 1d         66  %    CI:         78.3   %    70 - 86                                                          FL/HC:      21.0   %    19.3 - 21.3  HC:       286   mm     G. Age:  31w 3d         18  %    HC/AC:      0.99        0.96 -  1.17  AC:       288   mm     G. Age:  32w 6d         87  %    FL/BPD:     75.0   %    71 - 87  FL:         60  mm     G. Age:  31w 2d         34  %    FL/AC:      20.8   %    20 - 24  LV:        3.3  mm  Est. FW:    1911  gm      4 lb 3 oz     67  % ---------------------------------------------------------------------- OB History  Blood Type:   O+  Gravidity:    7         Term:   1        Prem:   2        SAB:   2  TOP:          0       Ectopic:  1        Living: 3 ---------------------------------------------------------------------- Gestational Age  LMP:           31w 2d        Date:  03/04/19                 EDD:  12/09/19  U/S Today:     32w 0d                                        EDD:   12/04/19  Best:          31w 2d     Det. By:  LMP  (03/04/19)          EDD:   12/09/19 ---------------------------------------------------------------------- Anatomy  Cranium:               Appears normal         Ductal Arch:            Appears normal  Cavum:                 Appears normal         Diaphragm:              Appears normal  Ventricles:            Appears normal         Stomach:                Appears normal, left                                                                        sided  Choroid Plexus:        Appears normal         Abdomen:                Appears normal  Cerebellum:            Appears normal         Abdominal Wall:         Appears nml (cord                                                                        insert, abd wall)  Posterior Fossa:       Appears normal         Cord Vessels:           Appears normal (3                                                                        vessel cord)  Nuchal Fold:           Not applicable (>36    Kidneys:                Appear normal  wks GA)  Thoracic:              Appears normal         Bladder:                Appears normal  Heart:                 Appears normal         Spine:                  Previously seen                          (4CH, axis, and                         situs)  RVOT:                  Appears normal         Upper Extremities:      Previously seen  LVOT:                  Appears normal         Lower Extremities:      Previously seen  Aortic Arch:           Appears normal ---------------------------------------------------------------------- Cervix Uterus Adnexa  Cervix  Length:           3.93  cm.  Normal appearance by transabdominal scan. ---------------------------------------------------------------------- Impression  Follow up growth due to size and dates discrepancy  Normal interval growth with good fetal movement and  amniotic fluid volume  Growth is appropriate for dates. ---------------------------------------------------------------------- Recommendations  Follow up as clinically indicated. ----------------------------------------------------------------------               Sander Nephew, MD Electronically Signed Final Report   10/09/2019 09:09 am ----------------------------------------------------------------------   MAU Course/MDM: Orders Placed This Encounter  Procedures  . Wet prep, genital  . OB Urine Culture  . Culture, beta strep (group b only)  . Urinalysis, Routine w reflex microscopic Urine, Clean Catch  . Discharge patient Discharge disposition: 01-Home or Self Care; Discharge patient date: 11/06/2019    Meds ordered this encounter  Medications  . DISCONTD: lactated ringers bolus 500 mL  . NIFEdipine (PROCARDIA) capsule 10 mg  . betamethasone acetate-betamethasone sodium phosphate (CELESTONE) injection 12 mg  . lactated ringers bolus 1,000 mL     NST reviewed, reactive Treatments in MAU included IVF, procardia, and betamethasone x1 '@0450'  Pt discharge with strict preterm labor precautions.  Assessment: 1. False labor before 37 completed weeks of gestation in third trimester   Pt presented with concern of contractions and bloody show at [redacted]w[redacted]d Of note, pt with h/o  preterm delivery x2 without makena in this pregnancy. On arrival, spec exam negative for pooling and ferning. Cervical exam with minimal change s/p 2 hours but then unchanged s/p IVF and 1 dose of procardia with decreased intensity of contractions. Unremarkable UA except for trace LE--will send OB UCx to ensure no asymptomatic bacteruria. Wet prep unremarkable except for clue cells and no concerning symptoms. GC/Chlamydia pending at time of discharge. Plan to discharge pt home given stable s/p 1st dose of betamethasone. Instructed to f/u to MAU in 24 hours for 2nd dose of betamethasone or sooner for return precautions (preterm labor, vaginal bleeding, decreased fetal movement).  Plan: Discharge home; plan for f/u in MAU on 9/30  in AM for second dose of betamethasone. -Plan for f/u with prenatal provider at [facility] on 10/4 as previously scheduled Labor precautions and fetal kick counts  Allergies as of 11/06/2019      Reactions   Amoxicillin Hives      Medication List    TAKE these medications   aspirin 81 MG chewable tablet Chew 1 tablet (81 mg total) by mouth daily.   Blood Pressure Kit 1 kit by Does not apply route daily.   Blood Pressure Kit Devi 1 kit by Does not apply route once a week. Check Blood Pressure regularly and record readings into the Bab[employer] App.  Large Cuff.  DX O90.0   Comfort Fit Maternity Supp Lg Misc Wear daily when ambulating   ferrous sulfate 325 (65 FE) MG tablet Commonly known as: FerrouSul Take 1 tablet (325 mg total) by mouth 2 (two) times daily.   loratadine 10 MG tablet Commonly known as: Claritin Take 1 tablet (10 mg total) by mouth daily.   terconazole 0.4 % vaginal cream Commonly known as: Terazol 7 Place 1 applicator vaginally at bedtime. Use for 7 days.   Vitafol Gummies 3.33-0.333-34.8 MG Chew Chew 3 tablets by mouth daily before breakfast.      Guillermina [place], MD 11/06/2019 8:58 AM

## 2019-11-07 LAB — CULTURE, OB URINE: Culture: NO GROWTH

## 2019-11-11 ENCOUNTER — Ambulatory Visit (INDEPENDENT_AMBULATORY_CARE_PROVIDER_SITE_OTHER): Payer: Medicaid Other | Admitting: Advanced Practice Midwife

## 2019-11-11 ENCOUNTER — Other Ambulatory Visit: Payer: Self-pay

## 2019-11-11 VITALS — BP 115/72 | HR 98 | Wt 186.0 lb

## 2019-11-11 DIAGNOSIS — Z3480 Encounter for supervision of other normal pregnancy, unspecified trimester: Secondary | ICD-10-CM

## 2019-11-11 DIAGNOSIS — Z3A36 36 weeks gestation of pregnancy: Secondary | ICD-10-CM

## 2019-11-11 DIAGNOSIS — D509 Iron deficiency anemia, unspecified: Secondary | ICD-10-CM

## 2019-11-11 DIAGNOSIS — O99013 Anemia complicating pregnancy, third trimester: Secondary | ICD-10-CM

## 2019-11-11 NOTE — Progress Notes (Signed)
   PRENATAL VISIT NOTE  Subjective:  Brittney [name] is a 29 y.o. [phone] at [redacted]w[redacted]d being seen today for ongoing prenatal care.  She is currently monitored for the following issues for this high-risk pregnancy and has Eczema; Previous preterm delivery, antepartum; Supervision of normal pregnancy, antepartum; LGSIL on Pap smear of cervix; History of diet controlled gestational diabetes mellitus (GDM); Maternal iron deficiency anemia complicating pregnancy in third trimester; and [redacted] weeks gestation of pregnancy on their problem list.  Patient reports occasional contractions.  Contractions: Irregular. Vag. Bleeding: None.  Movement: Present. Denies leaking of fluid.   The following portions of the patient's history were reviewed and updated as appropriate: allergies, current medications, past family history, past medical history, past social history, past surgical history and problem list.   Objective:   Vitals:   11/11/19 1405  BP: 115/72  Pulse: 98  Weight: 186 lb (84.4 kg)    Fetal Status: Fetal Heart Rate (bpm): 140 Fundal Height: 36 cm Movement: Present  Presentation: Vertex  General:  Alert, oriented and cooperative. Patient is in no acute distress.  Skin: Skin is warm and dry. No rash noted.   Cardiovascular: Normal heart rate noted  Respiratory: Normal respiratory effort, no problems with respiration noted  Abdomen: Soft, gravid, appropriate for gestational age.  Pain/Pressure: Present     Pelvic: Cervical exam performed in the presence of a chaperone Dilation: 1 Effacement (%): 50 Station: -2  Extremities: Normal range of motion.     Mental Status: Normal mood and affect. Normal behavior. Normal judgment and thought content.   Assessment and Plan:  Pregnancy: [id number] at [redacted]w[redacted]d 1. [redacted] weeks gestation of pregnancy - Strep Gp B Culture+Rflx - Cervicovaginal ancillary only( [place])  2. Supervision of other normal pregnancy, antepartum --Anticipatory guidance about next  visits/weeks of pregnancy given. --Next visit in 2 weeks in office  3. Maternal iron deficiency anemia complicating pregnancy in third trimester --Hgb 10.1 at 28 weeks, on oral iron.  Term labor symptoms and general obstetric precautions including but not limited to vaginal bleeding, contractions, leaking of fluid and fetal movement were reviewed in detail with the patient. Please refer to After Visit Summary for other counseling recommendations.   Return in about 1 week (around 11/18/2019).  Future Appointments  Date Time Provider Department Center  11/20/2019  2:40 PM Rasch, Harolyn Rutherford, NP [facility] None    Sharen Counter, CNM

## 2019-11-11 NOTE — Patient Instructions (Signed)
Things to Try After 37 weeks to Encourage Labor/Get Ready for Labor:   1.  Try the Miles Circuit at www.milescircuit.com daily to improve baby's position and encourage the onset of labor.  2. Walk a little and rest a little every day.  Change positions often.  3. Cervical Ripening: May try one or both a. Red Raspberry Leaf capsules or tea:  two 300mg or 400mg tablets with each meal, 2-3 times a day, or 1-3 cups of tea daily  Potential Side Effects Of Raspberry Leaf:  Most women do not experience any side effects from drinking raspberry leaf tea. However, nausea and loose stools are possible   b. Evening Primrose Oil capsules: may take 1 to 3 capsules daily. Take 1-2 capsules by mouth each day and place one capsule vaginally at night.  You may also prick the vaginal capsule to release the oil prior to inserting in the vagina. Some of the potential side effects:  Upset stomach  Loose stools or diarrhea  Headaches  Nausea  4. Sex (and especially sex with orgasm) can also help the cervix ripen and encourage labor onset.  Labor Precautions Reasons [facility]:  1.  Contractions are  5 minutes apart or less, each last 1 minute, these have been going on for 1-2 hours, and you cannot walk or talk during them 2.  You have a large gush of fluid, or a trickle of fluid that will not stop and you have to wear a pad 3.  You have bleeding that is bright red, heavier than spotting--like menstrual bleeding (spotting can be normal in early labor or after a check of your cervix) 4.  You do not feel the baby moving like he/she normally does 

## 2019-11-12 LAB — CERVICOVAGINAL ANCILLARY ONLY
Chlamydia: NEGATIVE
Comment: NEGATIVE
Comment: NORMAL
Neisseria Gonorrhea: NEGATIVE

## 2019-11-15 LAB — STREP GP B CULTURE+RFLX: Strep Gp B Culture+Rflx: NEGATIVE

## 2019-11-18 ENCOUNTER — Inpatient Hospital Stay (HOSPITAL_COMMUNITY)
Admission: AD | Admit: 2019-11-18 | Discharge: 2019-11-20 | DRG: 807 | Disposition: A | Discharge status: Home / Self Care | Payer: Medicaid Other | Attending: Obstetrics & Gynecology | Admitting: Obstetrics & Gynecology

## 2019-11-18 ENCOUNTER — Encounter (HOSPITAL_COMMUNITY): Payer: Self-pay | Admitting: Obstetrics & Gynecology

## 2019-11-18 ENCOUNTER — Other Ambulatory Visit: Payer: Self-pay

## 2019-11-18 ENCOUNTER — Inpatient Hospital Stay (EMERGENCY_DEPARTMENT_HOSPITAL)
Admission: AD | Admit: 2019-11-18 | Discharge: 2019-11-18 | Disposition: A | Discharge status: Home / Self Care | Payer: Medicaid Other | Source: Home / Self Care | Attending: Obstetrics & Gynecology | Admitting: Obstetrics & Gynecology

## 2019-11-18 DIAGNOSIS — O471 False labor at or after 37 completed weeks of gestation: Secondary | ICD-10-CM | POA: Insufficient documentation

## 2019-11-18 DIAGNOSIS — D509 Iron deficiency anemia, unspecified: Secondary | ICD-10-CM | POA: Diagnosis not present

## 2019-11-18 DIAGNOSIS — Z3480 Encounter for supervision of other normal pregnancy, unspecified trimester: Secondary | ICD-10-CM

## 2019-11-18 DIAGNOSIS — O26893 Other specified pregnancy related conditions, third trimester: Secondary | ICD-10-CM | POA: Diagnosis not present

## 2019-11-18 DIAGNOSIS — Z3A37 37 weeks gestation of pregnancy: Secondary | ICD-10-CM | POA: Insufficient documentation

## 2019-11-18 DIAGNOSIS — Z20822 Contact with and (suspected) exposure to covid-19: Secondary | ICD-10-CM | POA: Diagnosis not present

## 2019-11-18 DIAGNOSIS — R87612 Low grade squamous intraepithelial lesion on cytologic smear of cervix (LGSIL): Secondary | ICD-10-CM | POA: Diagnosis not present

## 2019-11-18 DIAGNOSIS — Z8632 Personal history of gestational diabetes: Secondary | ICD-10-CM

## 2019-11-18 DIAGNOSIS — O99013 Anemia complicating pregnancy, third trimester: Secondary | ICD-10-CM

## 2019-11-18 DIAGNOSIS — O09219 Supervision of pregnancy with history of pre-term labor, unspecified trimester: Secondary | ICD-10-CM

## 2019-11-18 DIAGNOSIS — O9902 Anemia complicating childbirth: Secondary | ICD-10-CM | POA: Diagnosis not present

## 2019-11-18 LAB — TYPE AND SCREEN
ABO/RH(D): O POS
Antibody Screen: NEGATIVE

## 2019-11-18 LAB — CBC
HCT: 29.7 % — ABNORMAL LOW (ref 36.0–46.0)
Hemoglobin: 9.1 g/dL — ABNORMAL LOW (ref 12.0–15.0)
MCH: 24.6 pg — ABNORMAL LOW (ref 26.0–34.0)
MCHC: 30.6 g/dL (ref 30.0–36.0)
MCV: 80.3 fL (ref 80.0–100.0)
Platelets: 310 10*3/uL (ref 150–400)
RBC: 3.7 MIL/uL — ABNORMAL LOW (ref 3.87–5.11)
RDW: 16.3 % — ABNORMAL HIGH (ref 11.5–15.5)
WBC: 14 10*3/uL — ABNORMAL HIGH (ref 4.0–10.5)
nRBC: 0.1 % (ref 0.0–0.2)

## 2019-11-18 LAB — RESPIRATORY PANEL BY RT PCR (FLU A&B, COVID)
Influenza A by PCR: NEGATIVE
Influenza B by PCR: NEGATIVE
SARS Coronavirus 2 by RT PCR: NEGATIVE

## 2019-11-18 LAB — RPR: RPR Ser Ql: NONREACTIVE

## 2019-11-18 MED ORDER — OXYCODONE-ACETAMINOPHEN 5-325 MG PO TABS: 1.0000 | ORAL_TABLET | ORAL | Status: DC | PRN

## 2019-11-18 MED ORDER — ACETAMINOPHEN 325 MG PO TABS
650.0000 mg | ORAL_TABLET | ORAL | Status: DC | PRN
  Administered 2019-11-18: 650 mg via ORAL
  Filled 2019-11-18: qty 2

## 2019-11-18 MED ORDER — DIBUCAINE (PERIANAL) 1 % EX OINT
1.0000 "application " | TOPICAL_OINTMENT | CUTANEOUS | Status: DC | PRN
  Filled 2019-11-18: qty 28

## 2019-11-18 MED ORDER — COCONUT OIL OIL
1.0000 "application " | TOPICAL_OIL | Status: DC | PRN
  Filled 2019-11-18: qty 120

## 2019-11-18 MED ORDER — IBUPROFEN 600 MG PO TABS
600.0000 mg | ORAL_TABLET | Freq: Four times a day (QID) | ORAL | Status: DC
  Administered 2019-11-18 – 2019-11-20 (×9): 600 mg via ORAL
  Filled 2019-11-18 (×9): qty 1

## 2019-11-18 MED ORDER — DIPHENHYDRAMINE HCL 25 MG PO CAPS: 25.0000 mg | ORAL_CAPSULE | Freq: Four times a day (QID) | ORAL | Status: DC | PRN

## 2019-11-18 MED ORDER — ONDANSETRON HCL 4 MG/2ML IJ SOLN: 4.0000 mg | INTRAMUSCULAR | Status: DC | PRN

## 2019-11-18 MED ORDER — NALBUPHINE HCL 10 MG/ML IJ SOLN
10.0000 mg | Freq: Once | INTRAMUSCULAR | Status: AC
  Administered 2019-11-18: 10 mg via INTRAMUSCULAR
  Filled 2019-11-18: qty 1

## 2019-11-18 MED ORDER — TETANUS-DIPHTH-ACELL PERTUSSIS 5-2.5-18.5 LF-MCG/0.5 IM SUSP: 0.5000 mL | Freq: Once | INTRAMUSCULAR | Status: DC

## 2019-11-18 MED ORDER — OXYCODONE-ACETAMINOPHEN 5-325 MG PO TABS: 2.0000 | ORAL_TABLET | ORAL | Status: DC | PRN

## 2019-11-18 MED ORDER — ZOLPIDEM TARTRATE 5 MG PO TABS: 5.0000 mg | ORAL_TABLET | Freq: Every evening | ORAL | Status: DC | PRN

## 2019-11-18 MED ORDER — ONDANSETRON HCL 4 MG PO TABS: 4.0000 mg | ORAL_TABLET | ORAL | Status: DC | PRN

## 2019-11-18 MED ORDER — BENZOCAINE-MENTHOL 20-0.5 % EX AERO
1.0000 "application " | INHALATION_SPRAY | CUTANEOUS | Status: DC | PRN
  Filled 2019-11-18: qty 56

## 2019-11-18 MED ORDER — SIMETHICONE 80 MG PO CHEW: 80.0000 mg | CHEWABLE_TABLET | ORAL | Status: DC | PRN

## 2019-11-18 MED ORDER — WITCH HAZEL-GLYCERIN EX PADS: 1.0000 "application " | MEDICATED_PAD | CUTANEOUS | Status: DC | PRN

## 2019-11-18 MED ORDER — OXYTOCIN 10 UNIT/ML IJ SOLN
INTRAMUSCULAR | Status: AC
  Administered 2019-11-18: 10 [IU]
  Filled 2019-11-18: qty 1

## 2019-11-18 MED ORDER — PRENATAL MULTIVITAMIN CH
1.0000 | ORAL_TABLET | Freq: Every day | ORAL | Status: DC
  Administered 2019-11-18 – 2019-11-19 (×2): 1 via ORAL
  Filled 2019-11-18 (×2): qty 1

## 2019-11-18 MED ORDER — SENNOSIDES-DOCUSATE SODIUM 8.6-50 MG PO TABS
2.0000 | ORAL_TABLET | ORAL | Status: DC
  Administered 2019-11-19 – 2019-11-20 (×2): 2 via ORAL
  Filled 2019-11-18 (×2): qty 2

## 2019-11-18 MED ORDER — OXYCODONE HCL 5 MG PO TABS
5.0000 mg | ORAL_TABLET | ORAL | Status: AC | PRN
  Administered 2019-11-18 (×2): 5 mg via ORAL
  Filled 2019-11-18 (×2): qty 1

## 2019-11-18 NOTE — Discharge Instructions (Signed)
Fetal Movement Counts What is a fetal movement count?  A fetal movement count is the number of times that you feel your baby move during a certain amount of time. This may also be called a fetal kick count. A fetal movement count is recommended for every pregnant woman. You may be asked to start counting fetal movements as early as week 28 of your pregnancy. Pay attention to when your baby is most active. You may notice your baby's sleep and wake cycles. You may also notice things that make your baby move more. You should do a fetal movement count:  When your baby is normally most active.  At the same time each day. A good time to count movements is while you are resting, after having something to eat and drink. How do I count fetal movements? 1. Find a quiet, comfortable area. Sit, or lie down on your side. 2. Write down the date, the start time and stop time, and the number of movements that you felt between those two times. Take this information with you to your health care visits. 3. Write down your start time when you feel the first movement. 4. Count kicks, flutters, swishes, rolls, and jabs. You should feel at least 10 movements. 5. You may stop counting after you have felt 10 movements, or if you have been counting for 2 hours. Write down the stop time. 6. If you do not feel 10 movements in 2 hours, contact your health care provider for further instructions. Your health care provider may want to do additional tests to assess your baby's well-being. Contact a health care provider if:  You feel fewer than 10 movements in 2 hours.  Your baby is not moving like he or she usually does. Date: ____________ Start time: ____________ Stop time: ____________ Movements: ____________ Date: ____________ Start time: ____________ Stop time: ____________ Movements: ____________ Date: ____________ Start time: ____________ Stop time: ____________ Movements: ____________ Date: ____________ Start time:  ____________ Stop time: ____________ Movements: ____________ Date: ____________ Start time: ____________ Stop time: ____________ Movements: ____________ Date: ____________ Start time: ____________ Stop time: ____________ Movements: ____________ Date: ____________ Start time: ____________ Stop time: ____________ Movements: ____________ Date: ____________ Start time: ____________ Stop time: ____________ Movements: ____________ Date: ____________ Start time: ____________ Stop time: ____________ Movements: ____________ This information is not intended to replace advice given to you by your health care provider. Make sure you discuss any questions you have with your health care provider. Document Revised: 09/13/2018 Document Reviewed: 09/13/2018 Elsevier Patient Education  2020 Elsevier Inc.   [employer] of the uterus can occur throughout pregnancy, but they are not always a sign that you are in labor. You may have practice contractions called Braxton Hicks contractions. These false labor contractions are sometimes confused with true labor. What are Deberah Pelton contractions? Braxton Hicks contractions are tightening movements that occur in the muscles of the uterus before labor. Unlike true labor contractions, these contractions do not result in opening (dilation) and thinning of the cervix. Toward the end of pregnancy (32-34 weeks), Braxton Hicks contractions can happen more often and may become stronger. These contractions are sometimes difficult to tell apart from true labor because they can be very uncomfortable. You should not feel embarrassed if you go to the hospital with false labor. Sometimes, the only way to tell if you are in true labor is for your health care provider to look for changes in the cervix. The health care provider will do a physical exam  and may monitor your contractions. If you are not in true labor, the exam should show that your cervix is not  dilating and your water has not broken. If there are no other health problems associated with your pregnancy, it is completely safe for you to be sent home with false labor. You may continue to have Braxton Hicks contractions until you go into true labor. How to tell the difference between true labor and false labor True labor  Contractions last 30-70 seconds.  Contractions become very regular.  Discomfort is usually felt in the top of the uterus, and it spreads to the lower abdomen and low back.  Contractions do not go away with walking.  Contractions usually become more intense and increase in frequency.  The cervix dilates and gets thinner. False labor  Contractions are usually shorter and not as strong as true labor contractions.  Contractions are usually irregular.  Contractions are often felt in the front of the lower abdomen and in the groin.  Contractions may go away when you walk around or change positions while lying down.  Contractions get weaker and are shorter-lasting as time goes on.  The cervix usually does not dilate or become thin. Follow these instructions at home:   Take over-the-counter and prescription medicines only as told by your health care provider.  Keep up with your usual exercises and follow other instructions from your health care provider.  Eat and drink lightly if you think you are going into labor.  If Braxton Hicks contractions are making you uncomfortable: ? Change your position from lying down or resting to walking, or change from walking to resting. ? Sit and rest in a tub of warm water. ? Drink enough fluid to keep your urine pale yellow. Dehydration may cause these contractions. ? Do slow and deep breathing several times an hour.  Keep all follow-up prenatal visits as told by your health care provider. This is important. Contact a health care provider if:  You have a fever.  You have continuous pain in your abdomen. Get help  right away if:  Your contractions become stronger, more regular, and closer together.  You have fluid leaking or gushing from your vagina.  You pass blood-tinged mucus (bloody show).  You have bleeding from your vagina.  You have low back pain that you never had before.  You feel your baby's head pushing down and causing pelvic pressure.  Your baby is not moving inside you as much as it used to. Summary  Contractions that occur before labor are called Braxton Hicks contractions, false labor, or practice contractions.  Braxton Hicks contractions are usually shorter, weaker, farther apart, and less regular than true labor contractions. True labor contractions usually become progressively stronger and regular, and they become more frequent.  Manage discomfort from [facility] contractions by changing position, resting in a warm bath, drinking plenty of water, or practicing deep breathing. This information is not intended to replace advice given to you by your health care provider. Make sure you discuss any questions you have with your health care provider. Document Revised: 01/06/2017 Document Reviewed: 06/09/2016 Elsevier Patient Education  2020 Elsevier Inc.   [employer] of the uterus can occur throughout pregnancy, but they are not always a sign that you are in labor. You may have practice contractions called Braxton Hicks contractions. These false labor contractions are sometimes confused with true labor. What are Deberah Pelton contractions? Braxton Hicks contractions are tightening movements that occur  in the muscles of the uterus before labor. Unlike true labor contractions, these contractions do not result in opening (dilation) and thinning of the cervix. Toward the end of pregnancy (32-34 weeks), Braxton Hicks contractions can happen more often and may become stronger. These contractions are sometimes difficult to tell apart from true labor because  they can be very uncomfortable. You should not feel embarrassed if you go to the hospital with false labor. Sometimes, the only way to tell if you are in true labor is for your health care provider to look for changes in the cervix. The health care provider will do a physical exam and may monitor your contractions. If you are not in true labor, the exam should show that your cervix is not dilating and your water has not broken. If there are no other health problems associated with your pregnancy, it is completely safe for you to be sent home with false labor. You may continue to have Braxton Hicks contractions until you go into true labor. How to tell the difference between true labor and false labor True labor  Contractions last 30-70 seconds.  Contractions become very regular.  Discomfort is usually felt in the top of the uterus, and it spreads to the lower abdomen and low back.  Contractions do not go away with walking.  Contractions usually become more intense and increase in frequency.  The cervix dilates and gets thinner. False labor  Contractions are usually shorter and not as strong as true labor contractions.  Contractions are usually irregular.  Contractions are often felt in the front of the lower abdomen and in the groin.  Contractions may go away when you walk around or change positions while lying down.  Contractions get weaker and are shorter-lasting as time goes on.  The cervix usually does not dilate or become thin. Follow these instructions at home:   Take over-the-counter and prescription medicines only as told by your health care provider.  Keep up with your usual exercises and follow other instructions from your health care provider.  Eat and drink lightly if you think you are going into labor.  If Braxton Hicks contractions are making you uncomfortable: ? Change your position from lying down or resting to walking, or change from walking to resting. ? Sit  and rest in a tub of warm water. ? Drink enough fluid to keep your urine pale yellow. Dehydration may cause these contractions. ? Do slow and deep breathing several times an hour.  Keep all follow-up prenatal visits as told by your health care provider. This is important. Contact a health care provider if:  You have a fever.  You have continuous pain in your abdomen. Get help right away if:  Your contractions become stronger, more regular, and closer together.  You have fluid leaking or gushing from your vagina.  You pass blood-tinged mucus (bloody show).  You have bleeding from your vagina.  You have low back pain that you never had before.  You feel your baby's head pushing down and causing pelvic pressure.  Your baby is not moving inside you as much as it used to. Summary  Contractions that occur before labor are called Braxton Hicks contractions, false labor, or practice contractions.  Braxton Hicks contractions are usually shorter, weaker, farther apart, and less regular than true labor contractions. True labor contractions usually become progressively stronger and regular, and they become more frequent.  Manage discomfort from [facility] contractions by changing position, resting in a warm  bath, drinking plenty of water, or practicing deep breathing. This information is not intended to replace advice given to you by your health care provider. Make sure you discuss any questions you have with your health care provider. Document Revised: 01/06/2017 Document Reviewed: 06/09/2016 Elsevier Patient Education  2020 [employer]

## 2019-11-18 NOTE — Discharge Summary (Addendum)
Postpartum Discharge Summary  Date of Service updated10/13/21     Patient Name: Brittney [name] DOB: 1990/04/01 MRN: [id number]  Date of admission: 11/18/2019 Delivery date:11/18/2019  Delivering provider: Lajean Manes  Date of discharge: 11/20/2019  Admitting diagnosis: Precipitate labor [O62.3] Intrauterine pregnancy: [redacted]w[redacted]d    Secondary diagnosis:  Active Problems:   Precipitous delivery  Additional problems: hx of preterm delivery, iron deficiency anemia complicating pregnancy, has some nasal congestion today     Discharge diagnosis: Term Pregnancy Delivered and Anemia                                              Post partum procedures:none Augmentation: none Complications: None  Hospital course: Onset of Labor With Vaginal Delivery      29y.o. yo [id number] admitted in Active Labor on 11/18/2019. Patient had an uncomplicated labor course as follows:  Membrane Rupture Time/Date: 5:01 AM ,11/18/2019   Delivery Method:Vaginal, Spontaneous  Episiotomy: None  Lacerations:  None  Patient had an uncomplicated postpartum course.  She is ambulating, tolerating a regular diet, passing flatus, and urinating well. Patient is discharged home in stable condition on 11/20/19.  Newborn Data: Birth date:11/18/2019  Birth time:5:04 AM  Gender:Female  Living status:Living  Apgars:7 ,8  Weight:3238 g   Magnesium Sulfate received: No BMZ received: No Rhophylac:N/A MMR:N/A T-DaP:Given prenatally Flu: N/A Transfusion:No  Physical exam  Vitals:   11/18/19 2159 11/19/19 0500 11/20/19 0040 11/20/19 0539  BP: (!) 108/53 118/75 129/88 114/76  Pulse: 87 78 89 86  Resp: '18 18 18 18  ' Temp: 98.6 F (37 C) 98 F (36.7 C) 98 F (36.7 C) 98.1 F (36.7 C)  TempSrc: Oral Oral Oral Oral  SpO2:   100%    General: alert, cooperative and no distress Lochia: appropriate Uterine Fundus: firm Incision: N/A DVT Evaluation: No evidence of DVT seen on physical  exam. Labs: Lab Results  Component Value Date   WBC 14.0 (H) 11/18/2019   HGB 9.1 (L) 11/18/2019   HCT 29.7 (L) 11/18/2019   MCV 80.3 11/18/2019   PLT 310 11/18/2019   CMP Latest Ref Rng & Units 04/13/2019  Glucose 70 - 99 mg/dL 101(H)  BUN 6 - 20 mg/dL 8  Creatinine 0.44 - 1.00 mg/dL 0.80  Sodium 135 - 145 mmol/L 136  Potassium 3.5 - 5.1 mmol/L 4.1  Chloride 98 - 111 mmol/L 105  CO2 22 - 32 mmol/L 23  Calcium 8.9 - 10.3 mg/dL 9.5  Total Protein 6.5 - 8.1 g/dL 7.1  Total Bilirubin 0.3 - 1.2 mg/dL 0.9  Alkaline Phos 38 - 126 U/L 39  AST 15 - 41 U/L 11(L)  ALT 0 - 44 U/L 10   Edinburgh Score: Edinburgh Postnatal Depression Scale Screening Tool 11/18/2019  I have been able to laugh and see the funny side of things. 0  I have looked forward with enjoyment to things. 0  I have blamed myself unnecessarily when things went wrong. 0  I have been anxious or worried for no good reason. 0  I have felt scared or panicky for no good reason. 0  Things have been getting on top of me. 0  I have been so unhappy that I have had difficulty sleeping. 0  I have felt sad or miserable. 0  I have been so unhappy that  I have been crying. 0  The thought of harming myself has occurred to me. 0  Edinburgh Postnatal Depression Scale Total 0     After visit meds:  Allergies as of 11/20/2019      Reactions   Amoxicillin Hives   Latex Itching, Rash      Medication List    STOP taking these medications   aspirin 81 MG chewable tablet   Comfort Fit Maternity Supp Lg Misc     TAKE these medications   Blood Pressure Kit 1 kit by Does not apply route daily.   Blood Pressure Kit Devi 1 kit by Does not apply route once a week. Check Blood Pressure regularly and record readings into the Babyscripts App.  Large Cuff.  DX O90.0   ferrous sulfate 325 (65 FE) MG tablet Commonly known as: FerrouSul Take 1 tablet (325 mg total) by mouth 2 (two) times daily.   ibuprofen 600 MG tablet Commonly  known as: ADVIL Take 1 tablet (600 mg total) by mouth every 6 (six) hours.   loratadine 10 MG tablet Commonly known as: Claritin Take 1 tablet (10 mg total) by mouth daily.   pseudoephedrine 30 MG tablet Commonly known as: SUDAFED Take 1 tablet (30 mg total) by mouth every 6 (six) hours as needed for congestion.   Vitafol Gummies 3.33-0.333-34.8 MG Chew Chew 3 tablets by mouth daily before breakfast.        Discharge home in stable condition Infant Feeding: Bottle Infant Disposition:NICU Discharge instruction: per After Visit Summary and Postpartum booklet. Activity: Advance as tolerated. Pelvic rest for 6 weeks.  Diet: routine diet Future Appointments: Future Appointments  Date Time Provider [place]  12/17/2019  2:30 PM Cephas Darby, MD [place] None   Follow up Visit:  [place]. Schedule an appointment as soon as possible for a visit in 4 week(s).   Specialty: Obstetrics and Gynecology Contact information: [address] [place] [phone]             Please schedule this patient for Postpartum visit in: 4 weeks with the following provider: Any provider  For C/S patients schedule nurse incision check in weeks 2 weeks: no  High risk pregnancy complicated by: hx of preterm delivery  Delivery mode: SVD- precip in MAU  Anticipated Birth Control: DepoProvera PP Procedures needed: Colposcopy   Schedule Integrated [place] visit: no     11/20/2019 Hansel Feinstein, CNM

## 2019-11-18 NOTE — MAU Provider Note (Signed)
S: [name] is a 29 y.o. [id number] at [redacted]w[redacted]d  who presents to MAU today for labor evaluation.     Cervical exam by RN:  Dilation: 1.5 Effacement (%): 50 Cervical Position: Middle Station: -2, Ballotable Presentation: Vertex Exam by:: Holly Flippin RN   Fetal Monitoring:  Baseline: 125 Variability: moderate variability Accelerations: multiple present, reactive strip Decelerations: none Contractions: every 4-6 minutes  MDM Discussed patient with RN. NST reviewed.   A: SIUP at [redacted]w[redacted]d  False labor  P: Discharge home Labor precautions and kick counts included in AVS Patient to follow-up with Venia Carbon, NP on 11/20/19 as scheduled  Patient may return to MAU as needed or when in labor   Sheila Oats, MD 11/18/2019 1:56 AM

## 2019-11-18 NOTE — H&P (Signed)
LABOR AND DELIVERY ADMISSION HISTORY AND PHYSICAL NOTE  Brittney [name] is a 29 y.o. female [phone] with IUP at 7w0dby LMP presenting for normal labor. She reports positive fetal movement. She denies leakage of fluid or vaginal bleeding.  Prenatal History/Complications: PNC at [facility] Pregnancy complications:  - Hx of preterm delivery  - iron deficiency anemia  - hx of precipitous labor  Past Medical History: Past Medical History:  Diagnosis Date  . Headache   . Preterm labor     Past Surgical History: Past Surgical History:  Procedure Laterality Date  . NO PAST SURGERIES      Obstetrical History: OB History    Gravida  7   Para  3   Term  1   Preterm  2   AB  3   Living  3     SAB  2   TAB      Ectopic  1   Multiple  0   Live Births  3           Social History: Social History   Socioeconomic History  . Marital status: Single    Spouse name: Not on file  . Number of children: Not on file  . Years of education: Not on file  . Highest education level: Not on file  Occupational History  . Not on file  Tobacco Use  . Smoking status: Never Smoker  . Smokeless tobacco: Never Used  Vaping Use  . Vaping Use: Never used  Substance and Sexual Activity  . Alcohol use: No    Alcohol/week: 0.0 standard drinks  . Drug use: No  . Sexual activity: Yes    Partners: Male    Birth control/protection: None  Other Topics Concern  . Not on file  Social History Narrative  . Not on file   Social Determinants of Health   Financial Resource Strain:   . Difficulty of Paying Living Expenses: Not on file  Food Insecurity:   . Worried About RCharity fundraiserin the Last Year: Not on file  . Ran Out of Food in the Last Year: Not on file  Transportation Needs:   . Lack of Transportation (Medical): Not on file  . Lack of Transportation (Non-Medical): Not on file  Physical Activity:   . Days of Exercise per Week: Not on file  . Minutes of Exercise  per Session: Not on file  Stress:   . Feeling of Stress : Not on file  Social Connections:   . Frequency of Communication with Friends and Family: Not on file  . Frequency of Social Gatherings with Friends and Family: Not on file  . Attends Religious Services: Not on file  . Active Member of Clubs or Organizations: Not on file  . Attends CArchivistMeetings: Not on file  . Marital Status: Not on file    Family History: Family History  Problem Relation Age of Onset  . Diabetes Other   . Hypertension Mother   . Hypertension Father   . Epilepsy Sister   . Epilepsy Brother   . Diabetes Maternal Aunt     Allergies: Allergies  Allergen Reactions  . Amoxicillin Hives  . Latex     Medications Prior to Admission  Medication Sig Dispense Refill Last Dose  . aspirin 81 MG chewable tablet Chew 1 tablet (81 mg total) by mouth daily. (Patient not taking: Reported on 09/19/2019) 30 tablet 6   . Blood Pressure KIT 1 kit  by Does not apply route daily. (Patient not taking: Reported on 10/21/2019) 1 kit 0   . Blood Pressure Monitoring (BLOOD PRESSURE KIT) DEVI 1 kit by Does not apply route once a week. Check Blood Pressure regularly and record readings into the Babyscripts App.  Large Cuff.  DX O90.0 1 each 0   . Elastic Bandages & Supports (COMFORT FIT MATERNITY SUPP LG) MISC Wear daily when ambulating 1 each 0   . ferrous sulfate (FERROUSUL) 325 (65 FE) MG tablet Take 1 tablet (325 mg total) by mouth 2 (two) times daily. 60 tablet 3   . loratadine (CLARITIN) 10 MG tablet Take 1 tablet (10 mg total) by mouth daily. (Patient not taking: Reported on 09/19/2019) 30 tablet 11   . Prenatal Vit-Fe Phos-FA-Omega (VITAFOL GUMMIES) 3.33-0.333-34.8 MG CHEW Chew 3 tablets by mouth daily before breakfast. 90 tablet 11     Review of Systems  All systems reviewed and negative except as stated in HPI  Physical Exam Last menstrual period 03/04/2019. General appearance: alert, cooperative and no  distress Lungs: clear to auscultation bilaterally Heart: regular rate and rhythm Abdomen: soft, non-tender; bowel sounds normal Extremities: No calf swelling or tenderness Presentation: cephalic Dilation: 10 Effacement (%): 100 Exam by:: Wende Bushy CNM  Prenatal labs: ABO, Rh: O/Positive/-- (04/30 0927) Antibody: Negative (04/30 0927) Rubella: 2.86 (04/30 0927) RPR: Non Reactive (08/12 1125)  HBsAg: Negative (04/30 0927)  HIV: Non Reactive (08/12 1125)  GC/Chlamydia: Negative GBS: Negative/-- (10/04 1508)  2 hr Glucola: [phone] Genetic screening:  Normal  Anatomy US: normal   Nursing Staff Provider  Office Location  [facility] Dating  LMP and 6 week [place]  Language  English Anatomy US  normal  Flu Vaccine  Not this yr 06/07/19 Genetic Screen  NIPS: low risk   TDaP vaccine   09/19/2019 Hgb A1C or  GTT Early  Third trimester Nml 2 hr GTT  COVID vaccine  Declined 09/19/19   LAB RESULTS     Blood Type O/Positive/-- (04/30 0927)   Feeding Plan Bottle Antibody Negative (04/30 0927)  Contraception Tubal Rubella 2.86 (04/30 0927)  Circumcision Yes if a boy RPR Non Reactive (08/12 1125)   Pediatrician  [facility] HBsAg Negative (04/30 0927)   Support Person FOB-[facility] HCVAb Negative  Prenatal Classes Info given HIV Non Reactive (08/12 1125)     BTL Consent 09/19/2019 GBS  (For PCN allergy, check sensitivities)   VBAC Consent  Pap 05/2019 - LSIL/HPV neg    Hgb Electro  Neg [facility]  BP Cuff Pt has cuff at home.  CF Neg [facility]    SMA Neg [facility]    Waterbirth  '[ ]'  Class '[ ]'  Consent '[ ]'  CNM visit    Induction  '[ ]'  Orders Entered '[ ]' Foley Y/N    Prenatal Transfer Tool  Maternal Diabetes: No Genetic Screening: Normal Maternal Ultrasounds/Referrals: Normal Fetal Ultrasounds or other Referrals:  None Maternal Substance Abuse:  No Significant Maternal Medications:  None Significant Maternal Lab Results: Group B Strep negative  No results found for this or any previous  visit (from the past 24 hour(s)).  Patient Active Problem List   Diagnosis Date Noted  . Precipitate labor 11/18/2019  . [redacted] weeks gestation of pregnancy 10/16/2019  . Maternal iron deficiency anemia complicating pregnancy in third trimester 09/20/2019  . History of diet controlled gestational diabetes mellitus (GDM) 08/21/2019  . 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 Supervision of normal pregnancy, antepartum 05/30/2019  .  Previous preterm delivery, antepartum 10/23/2017  . Eczema 12/23/2014     Lajean Manes, CNM 11/18/2019, 5:42 AM

## 2019-11-18 NOTE — MAU Note (Signed)
.   Brittney [name] is a 29 y.o. at [redacted]w[redacted]d here in MAU reporting: ctx that started at 2100 this afternoon. No VB or LOF. Endorses good fetal movement.    Vitals:   11/18/19 0022  BP: 120/75  Pulse: (!) 115  Resp: 15  Temp: 98.7 F (37.1 C)  SpO2: 100%     FHT:155

## 2019-11-19 MED ORDER — FERROUS SULFATE 325 (65 FE) MG PO TABS: 325.0000 mg | ORAL_TABLET | Freq: Two times a day (BID) | ORAL | Status: DC

## 2019-11-19 MED ORDER — FERROUS SULFATE 325 (65 FE) MG PO TABS
325.0000 mg | ORAL_TABLET | ORAL | Status: DC
  Administered 2019-11-19: 325 mg via ORAL
  Filled 2019-11-19: qty 1

## 2019-11-19 MED ORDER — MEDROXYPROGESTERONE ACETATE 150 MG/ML IM SUSP
150.0000 mg | Freq: Once | INTRAMUSCULAR | Status: AC
  Administered 2019-11-19: 150 mg via INTRAMUSCULAR
  Filled 2019-11-19: qty 1

## 2019-11-19 NOTE — Progress Notes (Signed)
Patient screened out for psychosocial assessment since none of the following apply: °Psychosocial stressors documented in mother or baby's chart °Gestation less than 32 weeks °Code at delivery  °Infant with anomalies °Please contact the Clinical Social Worker if specific needs arise, by MOB's request, or if MOB scores greater than 9/yes to question 10 on Edinburgh Postpartum Depression Screen. ° °Clevland Cork Boyd-Gilyard, MSW, LCSW °Clinical Social Work °[phone] °  °

## 2019-11-19 NOTE — Progress Notes (Signed)
POSTPARTUM PROGRESS NOTE  Post Partum Day 1  Subjective:  Brittney [name] is a 29 y.o. [id number] s/p SVD at [redacted]w[redacted]d.  No acute events overnight.  Pt denies problems with ambulating, voiding or po intake.  She denies nausea or vomiting.  Pain is well controlled.  She has had flatus. She has not had bowel movement.  Lochia Moderate.   Objective: Blood pressure 118/75, pulse 78, temperature 98 F (36.7 C), temperature source Oral, resp. rate 18, last menstrual period 03/04/2019, SpO2 100 %, unknown if currently breastfeeding.  Physical Exam:  General: alert, cooperative and no distress Abdomen: soft, nontender,  Uterine Fundus: firm, appropriately tender DVT Evaluation: No calf swelling or tenderness Extremities: no edema Skin: warm, dry  Recent Labs    11/18/19 0638  HGB 9.1*  HCT 29.7*    Assessment/Plan: Brittney [name] is a 29 y.o. [id number] s/p SVD at [redacted]w[redacted]d   PPD#1 - Doing well Contraception: Depo prior to discharge  Feeding: Formula  Dispo: Plan for discharge tomorrow. Iron deficiency anemia - PO iron supplementation    LOS: 1 day   Sharyon Cable, CNM 11/19/2019, 7:32 AM

## 2019-11-20 ENCOUNTER — Encounter: Payer: Medicaid Other | Admitting: Obstetrics and Gynecology

## 2019-11-20 MED ORDER — IBUPROFEN 600 MG PO TABS: 600.0000 mg | ORAL_TABLET | Freq: Four times a day (QID) | ORAL | 0 refills | Status: DC

## 2019-11-20 MED ORDER — PSEUDOEPHEDRINE HCL 30 MG PO TABS: 30.0000 mg | ORAL_TABLET | Freq: Four times a day (QID) | ORAL | 0 refills | Status: AC | PRN

## 2019-11-20 MED ORDER — MEDROXYPROGESTERONE ACETATE 150 MG/ML IM SUSP
150.0000 mg | Freq: Once | INTRAMUSCULAR | Status: DC
  Filled 2019-11-20: qty 1

## 2019-11-20 NOTE — Plan of Care (Signed)
All discharge teaching given, pt receptive. 

## 2019-11-20 NOTE — Discharge Instructions (Signed)

## 2019-11-21 ENCOUNTER — Telehealth: Payer: Self-pay

## 2019-11-21 NOTE — Telephone Encounter (Signed)
Transition Care Management Unsuccessful Follow-up Telephone Call  Date of discharge and from where:  11/20/2019 from [name]  Attempts:  1st Attempt  Reason for unsuccessful TCM follow-up call:  Left voice message

## 2019-11-22 NOTE — Telephone Encounter (Signed)
Transition Care Management Follow-up Telephone Call  Date of discharge and from where: 11/20/2019 from [facility]  How have you been since you were released from the hospital? Pt states that she is feeling a lot better.   Any questions or concerns? No  Items Reviewed:  Did the pt receive and understand the discharge instructions provided? Yes   Medications obtained and verified? Yes   Other? No   Any new allergies since your discharge? No   Dietary orders reviewed? N/A  Do you have support at home? Yes    Functional Questionnaire: (I = Independent and D = Dependent) ADLs: I  Bathing/Dressing- I  Meal Prep- I  Eating- I  Maintaining continence- I  Transferring/Ambulation- I  Managing Meds- I  Follow up appointments reviewed:   [facility] f/u appt confirmed? Yes  Scheduled to see Kelby Aline, MD on 12/17/2019 @ 2:30pm.  Are transportation arrangements needed? No   If their condition worsens, is the pt aware to call PCP or go to the Emergency Dept.? Yes  Was the patient provided with contact information for the PCP's office or ED? Yes  Was to pt encouraged to call back with questions or concerns? Yes

## 2019-12-17 ENCOUNTER — Encounter: Payer: Self-pay | Admitting: Obstetrics and Gynecology

## 2019-12-17 ENCOUNTER — Other Ambulatory Visit: Payer: Self-pay

## 2019-12-17 ENCOUNTER — Ambulatory Visit (INDEPENDENT_AMBULATORY_CARE_PROVIDER_SITE_OTHER): Payer: Medicaid Other | Admitting: Obstetrics and Gynecology

## 2019-12-17 DIAGNOSIS — O99893 Other specified diseases and conditions complicating puerperium: Secondary | ICD-10-CM

## 2019-12-17 DIAGNOSIS — R87612 Low grade squamous intraepithelial lesion on cytologic smear of cervix (LGSIL): Secondary | ICD-10-CM

## 2019-12-17 NOTE — Progress Notes (Signed)
    Post Partum Visit Note  Brittney [name] is a 29 y.o. [phone] female who presents for a postpartum visit. She is 4 weeks postpartum following a normal spontaneous vaginal delivery.  I have fully reviewed the prenatal and intrapartum course. The delivery was at 37 gestational weeks.  Anesthesia: none. Postpartum course has been unremarkable. Baby is doing well. Baby is feeding by Vella Redhead. Bleeding no bleeding. Bowel function is normal. Bladder function is normal. Patient is not sexually active. Contraception method is Depo-Provera injectionsreceived at discharge. Postpartum depression screening: negative. EPDS = 0    The pregnancy intention screening data noted above was reviewed. Potential methods of contraception were discussed. The patient elected to proceed with Hormonal Injection.      The following portions of the patient's history were reviewed and updated as appropriate: allergies, current medications, past family history, past medical history, past social history, past surgical history and problem list.  Review of Systems A comprehensive review of systems was negative.    Objective:  Last menstrual period 03/04/2019, unknown if currently breastfeeding.  General:  alert and cooperative   Breasts:  inspection negative, no nipple discharge or bleeding, no masses or nodularity palpable  Lungs: clear to auscultation bilaterally  Heart:  regular rate and rhythm, S1, S2 normal, no murmur, click, rub or gallop  Abdomen: soft, non-tender; bowel sounds normal; no masses,  no organomegaly   Vulva:  not evaluated  Vagina: not evaluated  Cervix:  not evaluated  Corpus: not examined  Adnexa:  not evaluated  Rectal Exam: Not performed.        Assessment:    Normal postpartum exam.  LGSIL on Pap   Plan:   Essential components of care per ACOG recommendations:  1.  Mood and well being: Patient with negative depression screening today. Reviewed local resources  for support.  - Patient does not use tobacco.  - hx of drug use? No     2. Infant care and feeding:  -Patient currently breastmilk feeding? Yes  If breastmilk feeding discussed return to work and pumping. If needed, patient was provided letter for work to allow for every 2-3 hr pumping breaks, and to be granted a private location to express breastmilk and refrigerated area to store breastmilk. Reviewed importance of draining breast regularly to support lactation. -Social determinants of health (SDOH) reviewed in EPIC. No concerns  3. Sexuality, contraception and birth spacing - Patient does not want a pregnancy in the next year.  Desired family size is unknown number of children.  - Reviewed forms of contraception in tiered fashion. Patient desired Depo-Provera .  First dose given prior to discharge from the hospital.  She will return for repeat Depo in 2 months.   - Discussed birth spacing of 18 months  4. Sleep and fatigue -Encouraged family/partner/community support of 4 hrs of uninterrupted sleep to help with mood and fatigue  5. Physical Recovery  - Discussed patients delivery and LGSIL on pap smear.  Patient to return for colposcopy. - Patient had a no degree laceration, - Patient has urinary incontinence? No - Patient is safe to resume physical and sexual activity  6.  Health Maintenance - Last pap smear done 06/07/19 and was abnormal with LGSIL with negative HPV. - She will return in 2 months for the colposcopy.   Darrin Nipper. Gerri Spore, MD [facility] [employer], [facility]

## 2020-02-13 ENCOUNTER — Other Ambulatory Visit: Payer: Self-pay

## 2020-02-13 ENCOUNTER — Ambulatory Visit (INDEPENDENT_AMBULATORY_CARE_PROVIDER_SITE_OTHER): Payer: Medicaid Other | Admitting: Family Medicine

## 2020-02-13 ENCOUNTER — Other Ambulatory Visit (HOSPITAL_COMMUNITY)
Admission: RE | Admit: 2020-02-13 | Discharge: 2020-02-13 | Disposition: A | Discharge status: Home / Self Care | Payer: Medicaid Other | Source: Ambulatory Visit | Attending: Family Medicine | Admitting: Family Medicine

## 2020-02-13 VITALS — BP 110/80 | HR 76 | Ht 63.0 in | Wt 174.0 lb

## 2020-02-13 DIAGNOSIS — N898 Other specified noninflammatory disorders of vagina: Secondary | ICD-10-CM | POA: Insufficient documentation

## 2020-02-13 DIAGNOSIS — B9689 Other specified bacterial agents as the cause of diseases classified elsewhere: Secondary | ICD-10-CM

## 2020-02-13 DIAGNOSIS — N760 Acute vaginitis: Secondary | ICD-10-CM | POA: Diagnosis present

## 2020-02-13 DIAGNOSIS — Z3009 Encounter for other general counseling and advice on contraception: Secondary | ICD-10-CM

## 2020-02-13 DIAGNOSIS — R300 Dysuria: Secondary | ICD-10-CM | POA: Insufficient documentation

## 2020-02-13 LAB — POCT WET PREP (WET MOUNT)
Clue Cells Wet Prep Whiff POC: POSITIVE
Trichomonas Wet Prep HPF POC: ABSENT

## 2020-02-13 LAB — POCT URINALYSIS DIP (MANUAL ENTRY)
Bilirubin, UA: NEGATIVE
Blood, UA: NEGATIVE
Glucose, UA: NEGATIVE mg/dL
Ketones, POC UA: NEGATIVE mg/dL
Leukocytes, UA: NEGATIVE
Nitrite, UA: NEGATIVE
Protein Ur, POC: NEGATIVE mg/dL
Spec Grav, UA: 1.025 (ref 1.010–1.025)
Urobilinogen, UA: 0.2 E.U./dL
pH, UA: 6 (ref 5.0–8.0)

## 2020-02-13 MED ORDER — METRONIDAZOLE 500 MG PO TABS: 500.0000 mg | ORAL_TABLET | Freq: Two times a day (BID) | ORAL | 0 refills | Status: AC

## 2020-02-13 NOTE — Patient Instructions (Signed)
It was wonderful to see you today.  I will give you a call with your results and likely was in an antibiotic therapy to help treat.  We have sent your urine for culture and we will follow-up for that to see if we need to treat for UTI as well.  I encourage you to continue thinking about options for birth control in the future.  If you decide on getting your tubes tied, please call your OB/GYN to schedule an appointment to discuss.  In the meantime please make sure that you are using condoms with every sexual encounter.

## 2020-02-13 NOTE — Assessment & Plan Note (Signed)
Currently coming to the end of her Depo injection, however does not want to do any further birth control.  Does not desire any more children.  Discussed options such as IUD, Nexplanon, tubal ligation.  She is interested in considering tubal ligation she would like to avoid hormonal options in the future, recommended continued discussion at home and schedule OB/GYN appointment if decides to proceed.  Recommended condom use in the interim.

## 2020-02-13 NOTE — Assessment & Plan Note (Signed)
Pelvic exam and wet prep consistent with BV.  Rx'd Flagyl BID X 7 days and discussed preventative measures.

## 2020-02-13 NOTE — Progress Notes (Signed)
    SUBJECTIVE:   CHIEF COMPLAINT / HPI: "BV"  Brittney [name] is a 30 year old female presenting for evaluation of vaginal discharge.  She has noticed a change in her vaginal discharge and odor for the past week.  Reports that is foul and fishy, similar to previous episodes of BV.  She has also had some vaginal irritation.  No recent change in soaps/clothes.  She is now 3 months postpartum and is still had some intermittent spotting while additionally on Depo injection, just recently stopped bleeding but still having some brown discharge.  She is not breast-feeding.  She is intermittently sexually active and monogamous relationship.  Would like to screen for STDs today without labs.  She also reports some dysuria, "tingling" at the end of her stream without any associated suprapubic pressure, urinary frequency/urgency.  In terms of contraception, she is still in the effective range of her Depo injection.  She does not want to continue with birth control from here and is considering getting her tubes tied.  She does not want any more children.  PERTINENT  PMH / PSH: Eczema, recent vaginal delivery in 11/2019  OBJECTIVE:   BP 110/80   Pulse 76   Ht 5\' 3"  (1.6 m)   Wt 174 lb (78.9 kg)   SpO2 99%   BMI 30.82 kg/m   General: Alert, NAD HEENT: NCAT, MMM Lungs: No increased WOB  Msk: Moves all extremities spontaneously  Ext: Warm, dry, 2+ distal pulses Pelvic exam: VULVA: normal appearing vulva with no masses, tenderness or lesions, VAGINA: vaginal discharge - frothy and yellow, exam chaperoned by CMA.  ASSESSMENT/PLAN:   BV (bacterial vaginosis) Pelvic exam and wet prep consistent with BV.  Rx'd Flagyl BID X 7 days and discussed preventative measures.  Dysuria Urinalysis unremarkable with no additional concerning S/SX suggestive of UTI at this time.  With concurrent BV as above, suspect likely due to vaginal irritation however will follow-up urine culture and treat accordingly.  Birth  control counseling Currently coming to the end of her Depo injection, however does not want to do any further birth control.  Does not desire any more children.  Discussed options such as IUD, Nexplanon, tubal ligation.  She is interested in considering tubal ligation she would like to avoid hormonal options in the future, recommended continued discussion at home and schedule OB/GYN appointment if decides to proceed.  Recommended condom use in the interim.    Follow-up if symptoms not improving or sooner if worsening.  , DO [facility]

## 2020-02-13 NOTE — Assessment & Plan Note (Signed)
Urinalysis unremarkable with no additional concerning S/SX suggestive of UTI at this time.  With concurrent BV as above, suspect likely due to vaginal irritation however will follow-up urine culture and treat accordingly.

## 2020-02-14 LAB — CERVICOVAGINAL ANCILLARY ONLY
Chlamydia: NEGATIVE
Comment: NEGATIVE
Comment: NEGATIVE
Comment: NORMAL
Neisseria Gonorrhea: NEGATIVE
Trichomonas: NEGATIVE

## 2020-02-15 LAB — URINE CULTURE: Organism ID, Bacteria: NO GROWTH

## 2020-02-17 ENCOUNTER — Encounter: Payer: Medicaid Other | Admitting: Obstetrics and Gynecology

## 2020-03-06 DIAGNOSIS — Z20822 Contact with and (suspected) exposure to covid-19: Secondary | ICD-10-CM | POA: Diagnosis not present

## 2020-03-31 ENCOUNTER — Encounter: Payer: Self-pay | Admitting: Obstetrics and Gynecology

## 2020-03-31 ENCOUNTER — Other Ambulatory Visit: Payer: Self-pay

## 2020-03-31 ENCOUNTER — Other Ambulatory Visit (HOSPITAL_COMMUNITY)
Admission: RE | Admit: 2020-03-31 | Discharge: 2020-03-31 | Disposition: A | Discharge status: Home / Self Care | Payer: Medicaid Other | Source: Ambulatory Visit | Attending: Obstetrics and Gynecology | Admitting: Obstetrics and Gynecology

## 2020-03-31 ENCOUNTER — Ambulatory Visit (INDEPENDENT_AMBULATORY_CARE_PROVIDER_SITE_OTHER): Payer: Medicaid Other | Admitting: Obstetrics and Gynecology

## 2020-03-31 VITALS — BP 122/87 | HR 83 | Wt 173.0 lb

## 2020-03-31 DIAGNOSIS — N760 Acute vaginitis: Secondary | ICD-10-CM

## 2020-03-31 DIAGNOSIS — R87612 Low grade squamous intraepithelial lesion on cytologic smear of cervix (LGSIL): Secondary | ICD-10-CM | POA: Diagnosis not present

## 2020-03-31 LAB — POCT URINE PREGNANCY: Preg Test, Ur: NEGATIVE

## 2020-03-31 NOTE — Patient Instructions (Signed)

## 2020-03-31 NOTE — Progress Notes (Signed)
30 yo P4 with LGSIL on 05/2019 pap smear here for colposcopy. Patient also reports a pruritic vaginal discharge for which a vaginal swab was obtained  Patient given informed consent, signed copy in the chart, time out was performed.  Placed in lithotomy position. Cervix viewed with speculum and colposcope after application of acetic acid.   Colposcopy adequate?  yes Acetowhite lesions? Yes 11-12 o'clock Punctation? no Mosaicism?  no Abnormal vasculature?  no Biopsies? Yes 12 o'clock ECC? yes  COMMENTS:  Patient was given post procedure instructions.  She will return in 2 weeks for results.  Catalina Antigua, MD

## 2020-04-01 ENCOUNTER — Other Ambulatory Visit: Payer: Self-pay | Admitting: Advanced Practice Midwife

## 2020-04-01 LAB — CERVICOVAGINAL ANCILLARY ONLY
Bacterial Vaginitis (gardnerella): NEGATIVE
Candida Glabrata: NEGATIVE
Candida Vaginitis: NEGATIVE
Comment: NEGATIVE
Comment: NEGATIVE
Comment: NEGATIVE

## 2020-04-01 LAB — SURGICAL PATHOLOGY

## 2020-05-12 DIAGNOSIS — Z20822 Contact with and (suspected) exposure to covid-19: Secondary | ICD-10-CM | POA: Diagnosis not present

## 2020-07-22 ENCOUNTER — Other Ambulatory Visit: Payer: Self-pay

## 2020-07-22 ENCOUNTER — Ambulatory Visit (INDEPENDENT_AMBULATORY_CARE_PROVIDER_SITE_OTHER): Payer: Medicaid Other | Admitting: Family Medicine

## 2020-07-22 ENCOUNTER — Ambulatory Visit (INDEPENDENT_AMBULATORY_CARE_PROVIDER_SITE_OTHER): Payer: Medicaid Other

## 2020-07-22 ENCOUNTER — Encounter: Payer: Self-pay | Admitting: Family Medicine

## 2020-07-22 VITALS — BP 115/80 | HR 101 | Ht 63.0 in | Wt 166.0 lb

## 2020-07-22 DIAGNOSIS — Z131 Encounter for screening for diabetes mellitus: Secondary | ICD-10-CM

## 2020-07-22 DIAGNOSIS — Z23 Encounter for immunization: Secondary | ICD-10-CM | POA: Diagnosis not present

## 2020-07-22 LAB — POCT GLYCOSYLATED HEMOGLOBIN (HGB A1C): Hemoglobin A1C: 5.2 % (ref 4.0–5.6)

## 2020-07-22 NOTE — Progress Notes (Signed)
    SUBJECTIVE:   CHIEF COMPLAINT / HPI:   Desire for vaccination against COVID Mom reports that she recently started a new position that requires her to have a COVID-vaccine and a TB test.  She has no concerns or questions about the vaccine at this time.  History of gestational diabetes Mom has a history of gestational diabetes with her recent pregnancy.  She does not have a history of diabetes outside of pregnancy.  She is interested in diabetes screening today.  PERTINENT  PMH / PSH: History of gestational diabetes  OBJECTIVE:   BP 115/80   Pulse (!) 101   Ht 5\' 3"  (1.6 m)   Wt 166 lb (75.3 kg)   LMP 07/02/2020   SpO2 98%   BMI 29.41 kg/m    General: Alert and cooperative and appears to be in no acute distress.  Mom is accompanied by her 3 children including her 39-month-old baby. Pulm: Breathing comfortably on room air.  No respiratory distress. Abdomen: Bowel sounds normal. Abdomen soft and non-tender.  Extremities: No peripheral edema.    ASSESSMENT/PLAN:   Desire for vaccination against COVID -First COVID-vaccine administered today -Monitored for 15 minutes following vaccination  History of gestational diabetes A1c 5.2 today.  Due to history of gestational diabetes, she may still benefit from annual screening for diabetes.  Need for tuberculosis testing She is informed that there is skin test and blood tests for tuberculosis.  She is not sure if she needs a specific test.  She reports that she plans to call the clinic again to schedule TB testing when she has more information.   6-month, MD [facility]

## 2020-08-12 ENCOUNTER — Ambulatory Visit: Payer: Medicaid Other

## 2020-09-05 IMAGING — US US MFM OB FOLLOW-UP
1 series · 14 of 28 positions shown · non-contrast
Comparison: none

[Series 1: us mfm ob follow-up · 37 acquisitions, 14 frames shown]
[im 2/37]
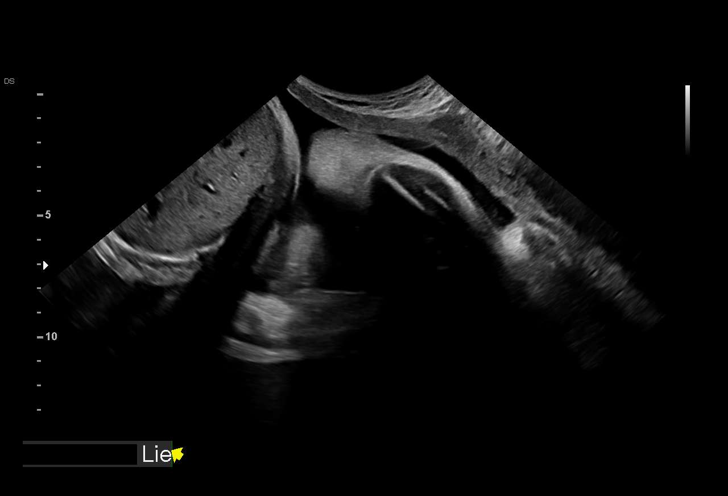
[im 5/37]
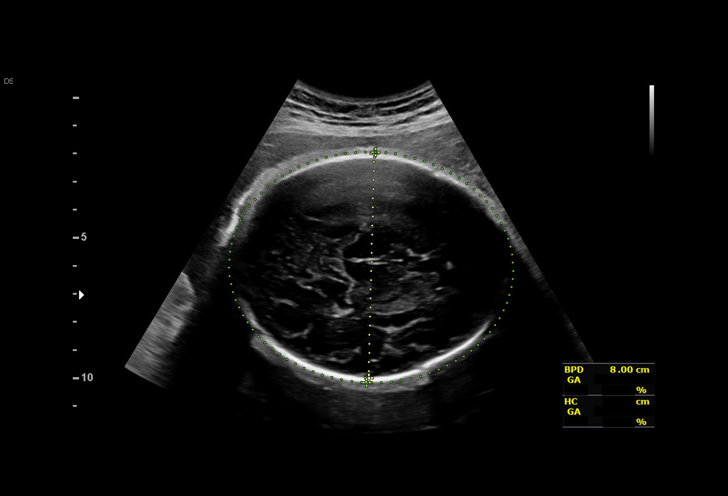
[im 7/37]
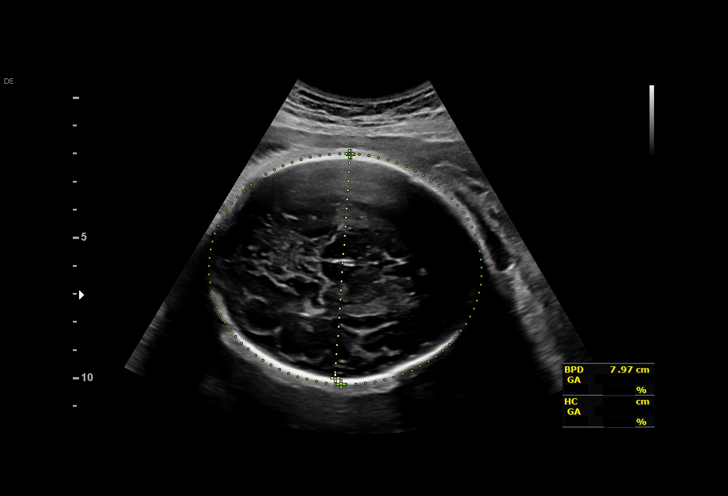
[im 10/37]
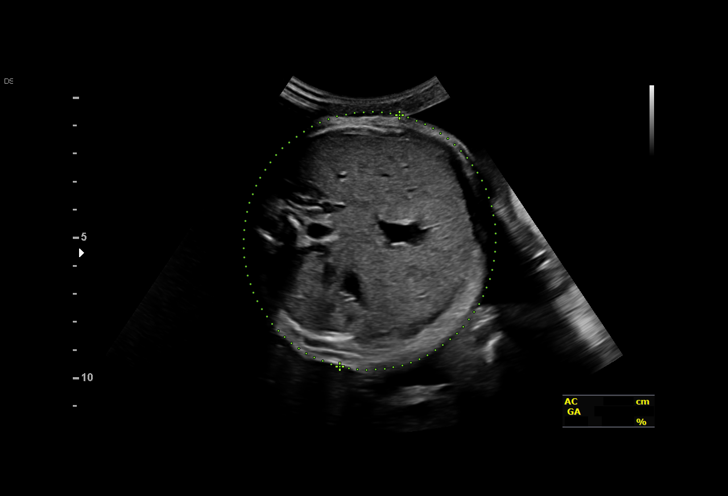
[im 13/37]
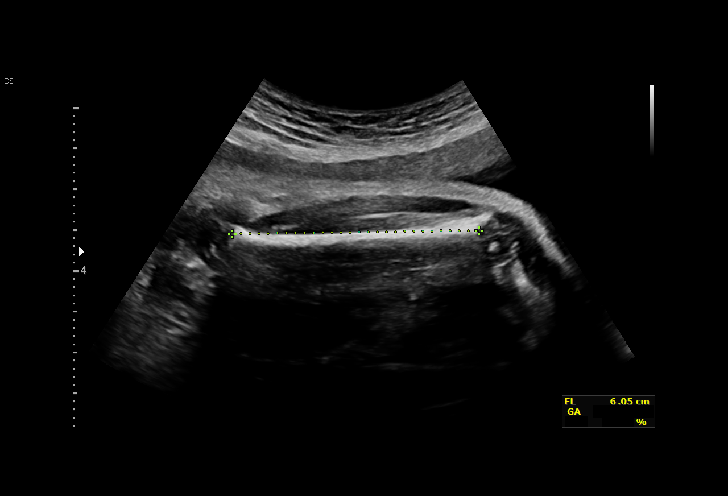
[im 15/37]
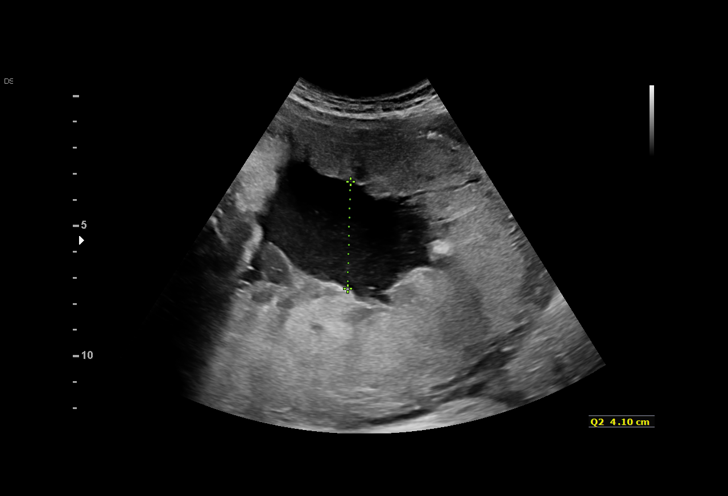
[im 18/37]
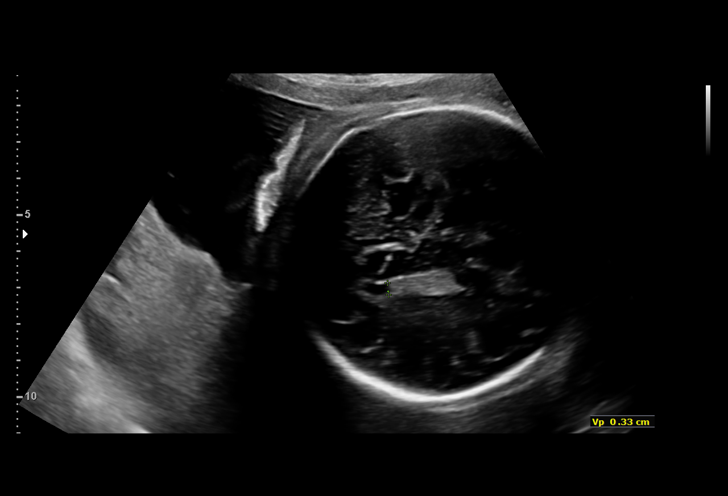
[im 21/37]
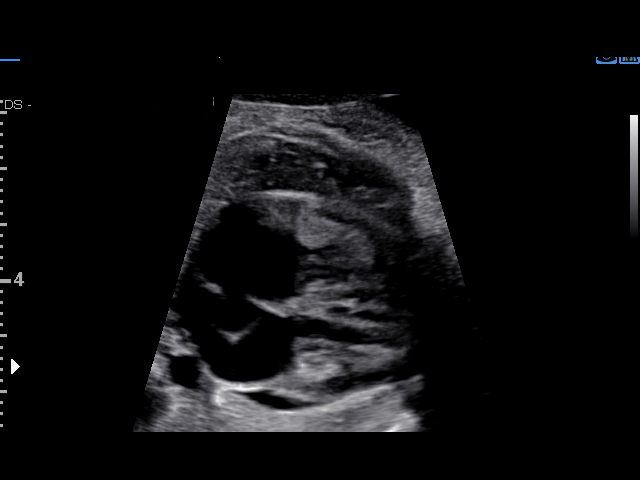
[im 23/37]
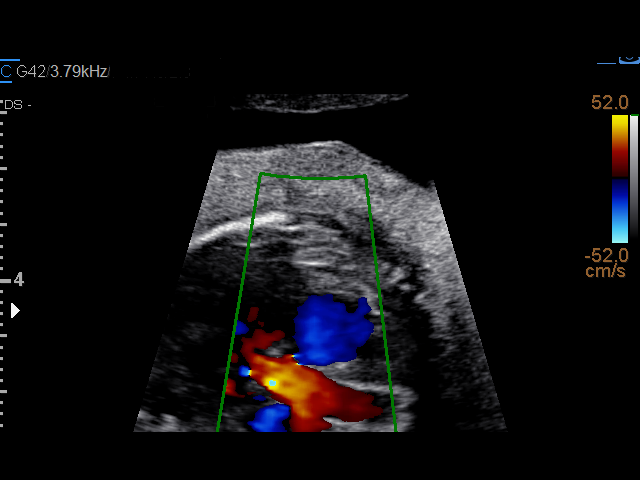
[im 26/37]
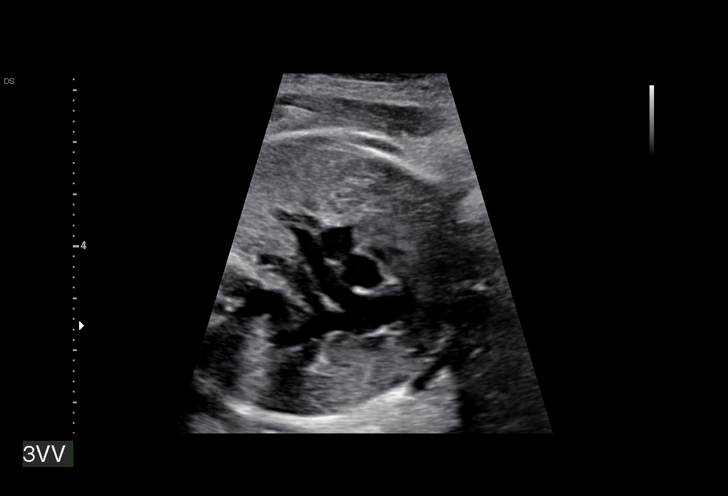
[im 29/37]
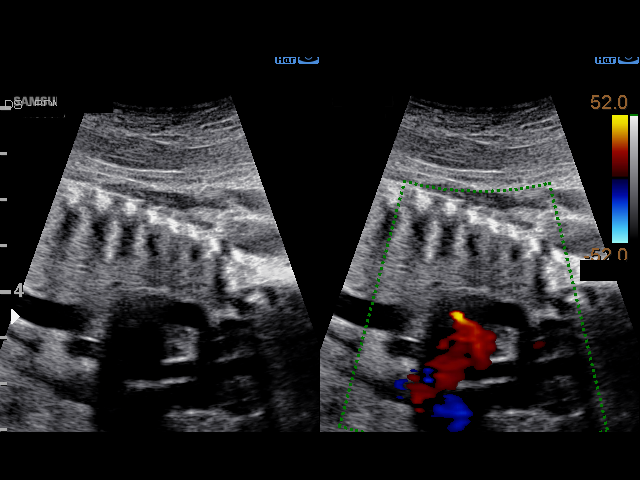
[im 31/37]
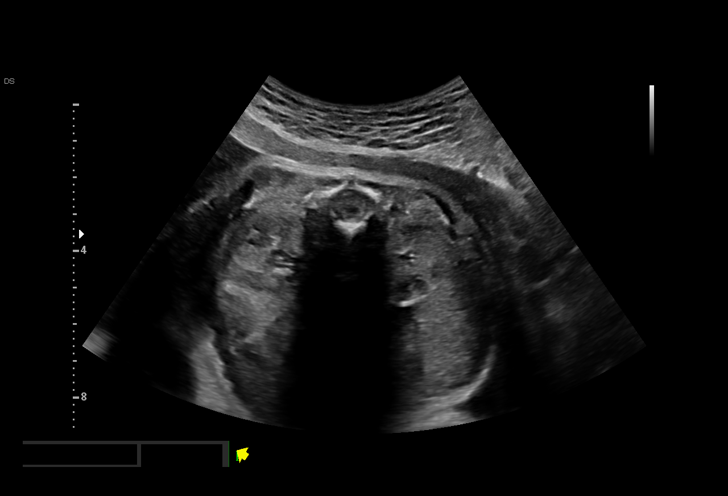
[im 34/37]
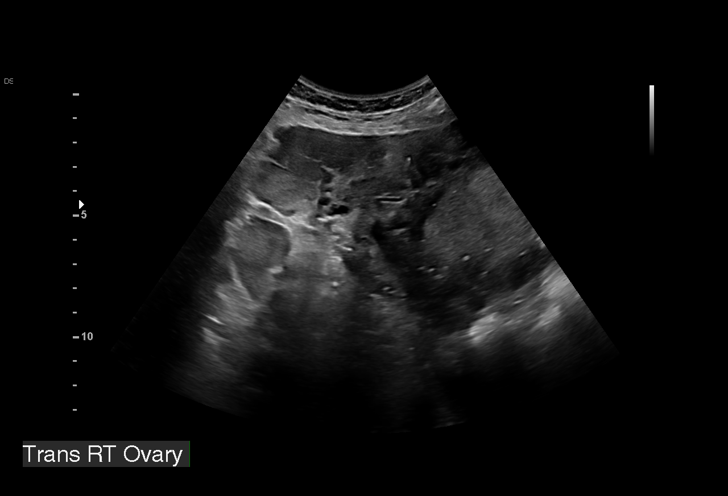
[im 37/37]
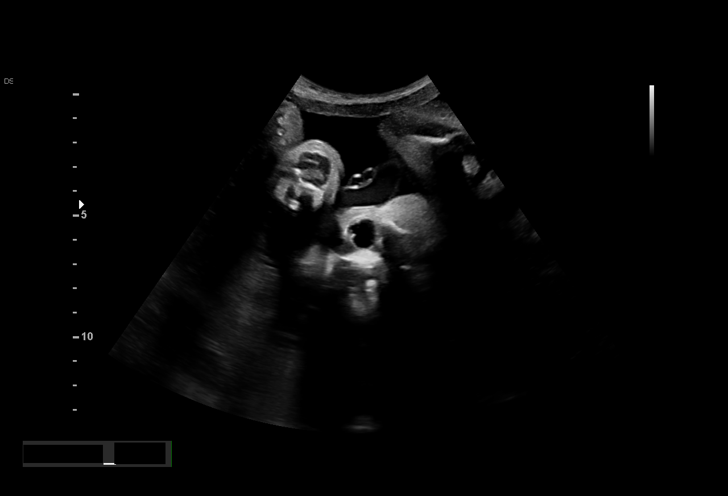

[14 of 28 positions shown; findings below may reference images not displayed]

Indications

 Uterine size-date discrepancy, third trimester
 Poor obstetric history: Previous preterm
 delivery, antepartum (36 weeks)
 Poor obstetric history: Previous gestational
 diabetes
 Antenatal follow-up for nonvisualized fetal
 anatomy (low risk NIPS)
 31 weeks gestation of pregnancy
Fetal Evaluation

 Num Of Fetuses:         1
 Cardiac Activity:       Observed
 Presentation:           Transverse, head to maternal left
 Placenta:               Fundal
 P. Cord Insertion:      Visualized, central

 Amniotic Fluid
 AFI FV:      Within normal limits

 AFI Sum(cm)     %Tile       Largest Pocket(cm)
 18.77           71

 RUQ(cm)       RLQ(cm)       LUQ(cm)        LLQ(cm)

Biometry
 BPD:        80  mm     G. Age:  32w 1d         66  %    CI:         78.3   %    70 - 86
                                                         FL/HC:      21.0   %    19.3 -
 HC:       286   mm     G. Age:  31w 3d         18  %    HC/AC:      0.99        0.96 -
 AC:       288   mm     G. Age:  32w 6d         87  %    FL/BPD:     75.0   %    71 - 87
 FL:         60  mm     G. Age:  31w 2d         34  %    FL/AC:      20.8   %    20 - 24
 LV:        3.3  mm

 Est. FW:    3033  gm      4 lb 3 oz     67  %
OB History

 Blood Type:   O+
 Gravidity:    7         Term:   1        Prem:   2        SAB:   2
 TOP:          0       Ectopic:  1        Living: 3
Gestational Age

 LMP:           31w 2d        Date:  03/04/19                 EDD:   12/09/19
 U/S Today:     32w 0d                                        EDD:   12/04/19
 Best:          31w 2d     Det. By:  LMP  (03/04/19)          EDD:   12/09/19
Anatomy

 Cranium:               Appears normal         Ductal Arch:            Appears normal
 Cavum:                 Appears normal         Diaphragm:              Appears normal
 Ventricles:            Appears normal         Stomach:                Appears normal, left
                                                                       sided
 Choroid Plexus:        Appears normal         Abdomen:                Appears normal
 Cerebellum:            Appears normal         Abdominal Wall:         Appears nml (cord
                                                                       insert, abd wall)
 Posterior Fossa:       Appears normal         Cord Vessels:           Appears normal (3
                                                                       vessel cord)
 Nuchal Fold:           Not applicable (>20    Kidneys:                Appear normal
                        wks GA)
 Thoracic:              Appears normal         Bladder:                Appears normal
 Heart:                 Appears normal         Spine:                  Previously seen
                        (4CH, axis, and
                        situs)
 RVOT:                  Appears normal         Upper Extremities:      Previously seen
 LVOT:                  Appears normal         Lower Extremities:      Previously seen
 Aortic Arch:           Appears normal
Cervix Uterus Adnexa

 Cervix
 Length:           3.93  cm.
 Normal appearance by transabdominal scan.
Impression

 Follow up growth due to size and dates discrepancy
 Normal interval growth with good fetal movement and
 amniotic fluid volume
 Growth is appropriate for dates.
Recommendations

 Follow up as clinically indicated.

## 2020-12-16 ENCOUNTER — Ambulatory Visit (INDEPENDENT_AMBULATORY_CARE_PROVIDER_SITE_OTHER): Payer: Medicaid Other | Admitting: Family Medicine

## 2020-12-16 ENCOUNTER — Other Ambulatory Visit: Payer: Self-pay

## 2020-12-16 ENCOUNTER — Ambulatory Visit: Payer: Medicaid Other

## 2020-12-16 ENCOUNTER — Other Ambulatory Visit (HOSPITAL_COMMUNITY)
Admission: RE | Admit: 2020-12-16 | Discharge: 2020-12-16 | Disposition: A | Discharge status: Home / Self Care | Payer: Medicaid Other | Source: Ambulatory Visit | Attending: Family Medicine | Admitting: Family Medicine

## 2020-12-16 DIAGNOSIS — N898 Other specified noninflammatory disorders of vagina: Secondary | ICD-10-CM | POA: Diagnosis not present

## 2020-12-16 DIAGNOSIS — Z113 Encounter for screening for infections with a predominantly sexual mode of transmission: Secondary | ICD-10-CM | POA: Diagnosis not present

## 2020-12-16 DIAGNOSIS — Z3009 Encounter for other general counseling and advice on contraception: Secondary | ICD-10-CM

## 2020-12-16 LAB — POCT URINE PREGNANCY: Preg Test, Ur: NEGATIVE

## 2020-12-16 MED ORDER — FLUCONAZOLE 150 MG PO TABS: 150.0000 mg | ORAL_TABLET | Freq: Once | ORAL | 0 refills | Status: AC

## 2020-12-16 MED ORDER — MEDROXYPROGESTERONE ACETATE 150 MG/ML IM SUSP
150.0000 mg | Freq: Once | INTRAMUSCULAR | Status: AC
  Administered 2020-12-16: 10:00:00 150 mg via INTRAMUSCULAR

## 2020-12-16 NOTE — Progress Notes (Deleted)
    SUBJECTIVE:   CHIEF COMPLAINT / HPI:   Vaginal Discharge: Patient is a 30 y.o. female presenting with vaginal discharge for *** days.  She states the discharge is of *** consistency.  She endorses *** vaginal odor.  She is interested in screening for sexually transmitted infections today.  PERTINENT  PMH / PSH: ***None relevant  OBJECTIVE:   There were no vitals taken for this visit.   General: NAD, pleasant, able to participate in exam Respiratory: Normal effort, no obvious respiratory distress Pelvic: VULVA: normal appearing vulva with no masses, tenderness or lesions, VAGINA: Normal appearing vagina with normal color, no lesions, with {GYN VAGINAL DISCHARGE:21986} discharge present, ***CERVIX: No lesions, {GYN VAGINAL DISCHARGE:21986} discharge present,  Chaperone *** present for pelvic exam  ASSESSMENT/PLAN:   No problem-specific Assessment & Plan notes found for this encounter.    Assessment:  30 y.o. female with vaginal discharge for***days, as well as***.  Physical exam significant for*** discharge.  Wet prep performed today shows *** consistent with ***.  Patient is interested in STI screening.   Plan: -Wet prep as above.  Will treat with***. -GC/chlamydia pending -Will check HIV and RPR  Jackelyn Poling, DO [facility]

## 2020-12-16 NOTE — Progress Notes (Signed)
    SUBJECTIVE:   CHIEF COMPLAINT / HPI:   Vaginal Discharge: Patient is a 30 y.o. female presenting with vaginal discharge for 2 days.  She states the discharge is of whitish consistency.  She endorses no vaginal odor.  She is also experiencing some vaginal itching.  She is interested in screening for sexually transmitted infections today.    Contraception management: She request a pregnancy test today as well.  She was previously using Depo Provera for contraception with last administration about a year ago. No history of migraines with aura. She previously had heavy bleeding on depo but before this had depo which stopped her bleeding.   PERTINENT  PMH / PSH: None relevant  OBJECTIVE:   BP 129/89   Pulse 73   Ht 5\' 3"  (1.6 m)   Wt 153 lb 6.4 oz (69.6 kg)   LMP 12/03/2020   SpO2 100%   BMI 27.17 kg/m    General: NAD, pleasant, able to participate in exam Respiratory: Normal effort, no obvious respiratory distress Pelvic: VULVA: normal appearing vulva with no masses, tenderness or lesions, VAGINA: Normal appearing vagina with normal color, no lesions, with copious and white discharge present  Chaperone Alexis present for pelvic exam  ASSESSMENT/PLAN:   Birth control counseling 30 year old female with negative pregnancy test today interested in contraception management.  She previously used Depo-Provera.  At 1 time the Depo-Provera caused increased bleeding better than at the time it reduced her bleeding.  She is considering an IUD versus Nexplanon but would like to think about these.  She is not as interested in OCPs due to hormonal content.  She would like to get the Depo-Provera today to give her some time to think about other contraception and would like to schedule with gyn clinic. -Negative pregnancy test -Depo-Provera today -We will schedule for Gyn clinic.   Vaginal discharge/vaginal itching 30 y.o. female with vaginal discharge for 2 days, as well as vaginal itching.   Physical exam significant for white cottage cheese like discharge.  Pregnancy test negative.  Swabs collected to test for BV, Candida, trichomonas as well as gonorrhea and chlamydia.  Symptoms and physical exam consistent with yeast infection. Plan: -Swabs pending to test for BV/Candida/trichomonas.  Will preemptively treat with Diflucan. -GC/chlamydia pending -Patient not currently interested in checking HIV and RPR  32, DO [facility]

## 2020-12-16 NOTE — Patient Instructions (Incomplete)
From your chart it appears you will need a Pap smear around February or March 2023.  I recommend that she schedule follow-up appointment with your primary care doctor at that time.

## 2020-12-16 NOTE — Assessment & Plan Note (Signed)
30 year old female with negative pregnancy test today interested in contraception management.  She previously used Depo-Provera.  At 1 time the Depo-Provera caused increased bleeding better than at the time it reduced her bleeding.  She is considering an IUD versus Nexplanon but would like to think about these.  She is not as interested in OCPs due to hormonal content.  She would like to get the Depo-Provera today to give her some time to think about other contraception and would like to schedule with gyn clinic. -Negative pregnancy test -Depo-Provera today -We will schedule for Gyn clinic.

## 2020-12-16 NOTE — Patient Instructions (Addendum)
According to your chart it looks like you will need a Pap smear around February or March 2023.  I recommend that you schedule this with your primary care doctor.  We did your Depo-Provera shot today.  This will cover you for 3 months for pregnancy.  I would like for you to schedule an appointment with our gynecology clinic upfront so that if you decide you would like the IUD or the Nexplanon you will have an appointment to get this done.  Your symptoms are consistent with a yeast infection.  I prescribed Diflucan for this.  We took some swabs and I will let you know if anything else is positive.

## 2020-12-17 LAB — CERVICOVAGINAL ANCILLARY ONLY
Bacterial Vaginitis (gardnerella): NEGATIVE
Candida Glabrata: NEGATIVE
Candida Vaginitis: POSITIVE — AB
Chlamydia: NEGATIVE
Comment: NEGATIVE
Comment: NEGATIVE
Comment: NEGATIVE
Comment: NEGATIVE
Comment: NEGATIVE
Comment: NORMAL
Neisseria Gonorrhea: NEGATIVE
Trichomonas: NEGATIVE

## 2021-01-04 ENCOUNTER — Ambulatory Visit: Payer: Medicaid Other | Admitting: Family Medicine

## 2021-01-04 NOTE — Progress Notes (Incomplete)
    SUBJECTIVE:   CHIEF COMPLAINT / HPI:   ***  PERTINENT  PMH / PSH: ***  OBJECTIVE:   There were no vitals taken for this visit.   General: Alert, no acute distress Cardio: Normal S1 and S2, RRR, no r/m/g Pulm: CTAB, normal work of breathing Abdomen: Bowel sounds normal. Abdomen soft and non-tender.  Extremities: No peripheral edema.  Neuro: Cranial nerves grossly intact   ASSESSMENT/PLAN:   No problem-specific Assessment & Plan notes found for this encounter.     Xion Debruyne, MD [facility]   

## 2021-03-21 NOTE — Patient Instructions (Incomplete)
It was wonderful to see you today.  Please bring ALL of your medications with you to every visit.   Today we talked about:  ** Your Pap smear was performed today. I will let you know the results via MyChart.   Thank you for choosing [facility].   Please call [phone] with any questions about today's appointment.  Please be sure to schedule follow up at the front  desk before you leave today.   Sabino Dick, DO PGY-2 Family Medicine

## 2021-03-21 NOTE — Progress Notes (Deleted)
° ° °  SUBJECTIVE:   CHIEF COMPLAINT / HPI:   Dizziness  Brittney [name] is a 31 y.o. female who presents to the [facility] clinic today to discuss ongoing dizziness *** . Has history of diet controlled gestational diabetes. Last Hgb A1c 5.2 in 07/2020.   Health Maintenance Last Pap smear 05/2019 with LSIL, negative HPV. Had a negative colposcopy in 03/2020. Prior to that she had a normal Pap smear in 05/2017 but without co-testing. She is overdue for 1-year follow up for Pap with cotesting.    PERTINENT  PMH / PSH: A1GDM   OBJECTIVE:   There were no vitals taken for this visit. ***  General: NAD, pleasant, able to participate in exam Cardiac: RRR, no murmurs. Respiratory: CTAB, normal effort, No wheezes, rales or rhonchi Abdomen: Bowel sounds present, nontender, nondistended, no hepatosplenomegaly. Foot exam: No deformities, ulcerations, or other skin breakdown on feet bilaterally.  Sensation intact to monofilament and light touch.  PT and DP pulses intact BL.  *** Extremities: no edema or cyanosis. Skin: warm and dry, no rashes noted Neuro: alert, no obvious focal deficits Psych: Normal affect and mood  ASSESSMENT/PLAN:   No problem-specific Assessment & Plan notes found for this encounter.     Sharion Settler, [place]

## 2021-03-22 ENCOUNTER — Ambulatory Visit: Payer: Medicaid Other | Admitting: Family Medicine

## 2021-05-21 ENCOUNTER — Other Ambulatory Visit: Payer: Self-pay

## 2021-05-21 ENCOUNTER — Ambulatory Visit (INDEPENDENT_AMBULATORY_CARE_PROVIDER_SITE_OTHER): Payer: Medicaid Other | Admitting: Family Medicine

## 2021-05-21 ENCOUNTER — Encounter (HOSPITAL_COMMUNITY): Payer: Self-pay | Admitting: Emergency Medicine

## 2021-05-21 ENCOUNTER — Emergency Department (HOSPITAL_COMMUNITY)
Admission: EM | Admit: 2021-05-21 | Discharge: 2021-05-21 | Discharge status: Against Medical Advice | Payer: Medicaid Other | Attending: Emergency Medicine | Admitting: Emergency Medicine

## 2021-05-21 VITALS — BP 113/75 | HR 70 | Ht 62.0 in | Wt 157.5 lb

## 2021-05-21 DIAGNOSIS — Z5321 Procedure and treatment not carried out due to patient leaving prior to being seen by health care provider: Secondary | ICD-10-CM | POA: Diagnosis not present

## 2021-05-21 DIAGNOSIS — N390 Urinary tract infection, site not specified: Secondary | ICD-10-CM | POA: Insufficient documentation

## 2021-05-21 DIAGNOSIS — T3695XA Adverse effect of unspecified systemic antibiotic, initial encounter: Secondary | ICD-10-CM

## 2021-05-21 DIAGNOSIS — R202 Paresthesia of skin: Secondary | ICD-10-CM | POA: Diagnosis not present

## 2021-05-21 DIAGNOSIS — N3001 Acute cystitis with hematuria: Secondary | ICD-10-CM

## 2021-05-21 DIAGNOSIS — R300 Dysuria: Secondary | ICD-10-CM | POA: Diagnosis not present

## 2021-05-21 DIAGNOSIS — R319 Hematuria, unspecified: Secondary | ICD-10-CM | POA: Insufficient documentation

## 2021-05-21 DIAGNOSIS — R399 Unspecified symptoms and signs involving the genitourinary system: Secondary | ICD-10-CM

## 2021-05-21 DIAGNOSIS — L299 Pruritus, unspecified: Secondary | ICD-10-CM | POA: Insufficient documentation

## 2021-05-21 DIAGNOSIS — B379 Candidiasis, unspecified: Secondary | ICD-10-CM

## 2021-05-21 LAB — URINALYSIS, ROUTINE W REFLEX MICROSCOPIC
Bacteria, UA: NONE SEEN
Bilirubin Urine: NEGATIVE
Glucose, UA: NEGATIVE mg/dL
Ketones, ur: NEGATIVE mg/dL
Nitrite: NEGATIVE
Protein, ur: 100 mg/dL — AB
RBC / HPF: >50 RBC/hpf — ABNORMAL HIGH (ref 0–5)
Specific Gravity, Urine: 1.021 (ref 1.005–1.030)
WBC, UA: >50 WBC/hpf — ABNORMAL HIGH (ref 0–5)
pH: 5 (ref 5.0–8.0)

## 2021-05-21 LAB — POCT URINALYSIS DIP (MANUAL ENTRY)
Bilirubin, UA: NEGATIVE
Glucose, UA: NEGATIVE mg/dL
Ketones, POC UA: NEGATIVE mg/dL
Nitrite, UA: NEGATIVE
Protein Ur, POC: 100 mg/dL — AB
Spec Grav, UA: >=1.03 — AB (ref 1.010–1.025)
Urobilinogen, UA: 0.2 E.U./dL
pH, UA: 6 (ref 5.0–8.0)

## 2021-05-21 LAB — POCT UA - MICROSCOPIC ONLY

## 2021-05-21 LAB — PREGNANCY, URINE: Preg Test, Ur: NEGATIVE

## 2021-05-21 MED ORDER — FLUCONAZOLE 150 MG PO TABS: 150.0000 mg | ORAL_TABLET | Freq: Once | ORAL | 0 refills | Status: AC

## 2021-05-21 MED ORDER — CEPHALEXIN 500 MG PO CAPS: 500.0000 mg | ORAL_CAPSULE | Freq: Four times a day (QID) | ORAL | 0 refills | Status: AC

## 2021-05-21 NOTE — ED Triage Notes (Signed)
Patient here with UTI, she states that she has been trying to treat it at home.  Patient states that she is still having tingling and itching sensation and strong urge to urinate and having hesitancy with pain and hematuria.   ?

## 2021-05-21 NOTE — Progress Notes (Signed)
   SUBJECTIVE:   CHIEF COMPLAINT / HPI:   Chief Complaint  Patient presents with   Hematuria     Brittney [name] is a 31 y.o. female here for:   Dysuria: Pain urinating started about a week ago. Patient reports  urgency,  blood in urine and  frequency.  She has tried cranberry juice, apple cider vinegar without relief. Reports no antibiotics in the last 30 days. History of UTIs earlier in life but none in the last year.  Patient reports  no exposure to an STI.  Patient's last menstrual period was 01/11/2021. Uses depo for contraception but has not had a period since.  Went to the ED this morning but left without being seen. Has some nausea.    Patient reports no back pain, no fevers, no vaginal discharge, or mouth ulcers.    PERTINENT  PMH / PSH: reviewed and updated as appropriate   OBJECTIVE:   BP 113/75   Pulse 70   Ht 5\' 2"  (1.575 m)   Wt 157 lb 8 oz (71.4 kg)   LMP 01/11/2021   SpO2 99%   BMI 28.81 kg/m    GEN: well appearing female in no acute distress  CVS: well perfused  RESP: speaking in full sentences without pause, no respiratory distress  ABD: soft, non tender, no CVA tenderness   ASSESSMENT/PLAN:    Acute Cystitis   UA congruent with acute cystitis with hematuria.  Treat with Keflex 500 mg QID for five days. ED urine with similar results. Urine pregnancy test obtained in the ED was negative this morning. Given fluconazole ppx for antibiotic induced yeast infection to be taken after she completes her antibiotics as she has history of yeast infections with antibiotics.   Lyndee Hensen, DO     PGY-3, [facility] 05/21/2021

## 2021-05-21 NOTE — ED Provider Triage Note (Signed)
Emergency Medicine Provider Triage Evaluation Note ? ?Brittney [name] , a 31 y.o. female  was evaluated in triage.  Pt complains of dysuria and frequency.  Took 2 home UPT that were both negative.  Denies vaginal discharge.  No fever. ? ?Review of Systems  ?Positive: Dysuria, frequency ?Negative: fever ? ?Physical Exam  ?BP 122/79 (BP Location: Right Arm)   Pulse 75   Temp 98.3 ?F (36.8 ?C) (Oral)   Resp 15   SpO2 100%  ?Gen:   Awake, no distress   ?Resp:  Normal effort  ?MSK:   Moves extremities without difficulty  ?Other:  ? ?Medical Decision Making  ?Medically screening exam initiated at 4:12 AM.  Appropriate orders placed.  Brittney [name] was informed that the remainder of the evaluation will be completed by another provider, this initial triage assessment does not replace that evaluation, and the importance of remaining in the ED until their evaluation is complete. ? ?Will check UA and UPT. ?  ?Garlon Hatchet, PA-C ?05/21/21 1610 ? ?

## 2021-05-21 NOTE — ED Notes (Signed)
Called X2 with no answer.   

## 2021-05-21 NOTE — Patient Instructions (Addendum)
Stop by pharmacy to pick up your prescriptions.  ?

## 2021-05-21 NOTE — ED Notes (Signed)
Pt called again x3, still no response.  ?

## 2021-07-13 ENCOUNTER — Encounter: Payer: Self-pay | Admitting: *Deleted

## 2021-09-13 ENCOUNTER — Ambulatory Visit (INDEPENDENT_AMBULATORY_CARE_PROVIDER_SITE_OTHER): Payer: Self-pay | Admitting: Student

## 2021-09-13 ENCOUNTER — Other Ambulatory Visit (HOSPITAL_COMMUNITY)
Admission: RE | Admit: 2021-09-13 | Discharge: 2021-09-13 | Disposition: A | Discharge status: Home / Self Care | Payer: Medicaid Other | Source: Ambulatory Visit | Attending: Family Medicine | Admitting: Family Medicine

## 2021-09-13 VITALS — BP 112/76 | HR 91 | Ht 63.0 in | Wt 165.0 lb

## 2021-09-13 DIAGNOSIS — Z124 Encounter for screening for malignant neoplasm of cervix: Secondary | ICD-10-CM | POA: Diagnosis present

## 2021-09-13 DIAGNOSIS — Z7251 High risk heterosexual behavior: Secondary | ICD-10-CM

## 2021-09-13 DIAGNOSIS — N760 Acute vaginitis: Secondary | ICD-10-CM

## 2021-09-13 DIAGNOSIS — R87612 Low grade squamous intraepithelial lesion on cytologic smear of cervix (LGSIL): Secondary | ICD-10-CM

## 2021-09-13 DIAGNOSIS — N898 Other specified noninflammatory disorders of vagina: Secondary | ICD-10-CM | POA: Diagnosis present

## 2021-09-13 DIAGNOSIS — Z3009 Encounter for other general counseling and advice on contraception: Secondary | ICD-10-CM

## 2021-09-13 DIAGNOSIS — B9689 Other specified bacterial agents as the cause of diseases classified elsewhere: Secondary | ICD-10-CM

## 2021-09-13 LAB — POCT WET PREP (WET MOUNT)
Clue Cells Wet Prep Whiff POC: POSITIVE
Trichomonas Wet Prep HPF POC: ABSENT

## 2021-09-13 LAB — POCT URINE PREGNANCY: Preg Test, Ur: NEGATIVE

## 2021-09-13 MED ORDER — METRONIDAZOLE 0.75 % VA GEL: 1.0000 | Freq: Every day | VAGINAL | 0 refills | Status: AC

## 2021-09-13 NOTE — Progress Notes (Signed)
  SUBJECTIVE:   CHIEF COMPLAINT / HPI:   Vaginal discharge: Complains of yellow/brown vaginal discharge with odor the last couple weeks, minimal itching.  Does not have concerns for STD but is concern for BV.  She is currently sexually active and is not currently using contraception.  LMP 08/20/2021 she has taken 2 home pregnancy tests which were negative.  Request swab for STDs and BV/yeast infection in addition to pregnancy test.  She requests gel prescription rather than oral medication as she has trouble with the pills.  States she has been on Depo in the past and is not interested in any methods of birth control currently.  PERTINENT  PMH / PSH: BV  OBJECTIVE:  BP 112/76   Pulse 91   Ht 5\' 3"  (1.6 m)   Wt 165 lb (74.8 kg)   LMP 08/20/2021 (Exact Date)   SpO2 98%   Breastfeeding No   BMI 29.23 kg/m   General: NAD, pleasant, able to participate in exam Pelvic exam: normal external genitalia, vulva, uterus and adnexa, VAGINA: normal appearing vagina with normal color, no lesions, vaginal discharge - white and creamy, CERVIX: normal appearing cervix without discharge or lesions.  Chaperone Jazmin Hartsell present for pelvic exam.   ASSESSMENT/PLAN:  BV (bacterial vaginosis) Pelvic exam and wet prep consistent with BV.  MetroGel daily x5 days.  LGSIL on Pap smear of cervix Due for Pap given LGSIL on previous Pap and colposcopy additionally consistent for this.  Pap performed today.  Birth control counseling Negative pregnancy test.  Not interested in discussing birth control methods at this time.  Advised using barrier protection at for STD prevention.   Return if symptoms worsen or fail to improve. 08/22/2021, DO 09/14/2021, 11:13 AM PGY-2, [facility]

## 2021-09-13 NOTE — Patient Instructions (Addendum)
It was great to see you today! Thank you for choosing [facility] for your primary care. Brittney [name] was seen for concern for STD/BV.  Today we addressed: Bacterial vaginosis: Your swab showed that you have bacterial vaginosis.  As requested, I have prescribed MetroGel vaginal applicator use daily for 5 days. We also tested you for STDs and performed your Pap smear.  I will be in touch with you regarding those results.  Pregnancy test was negative. Given you are not on any birth control currently, I do recommend obtaining birth control or regular least using barrier protection as there is always a chance of getting pregnant.  Should you change your mind, or like to discuss this further, we are always here for you.  If you haven't already, sign up for My Chart to have easy access to your labs results, and communication with your primary care physician.  We are checking some labs today. If they are abnormal, I will call you. If they are normal, I will send you a MyChart message (if it is active) or a letter in the mail. If you do not hear about your labs in the next 2 weeks, please call the office.   You should return to our clinic Return if symptoms worsen or fail to improve.  I recommend that you always bring your medications to each appointment as this makes it easy to ensure you are on the correct medications and helps [place] not miss refills when you need them.  Please arrive 15 minutes before your appointment to ensure smooth check in process.  We appreciate your efforts in making this happen.  Please call the clinic at [phone] if your symptoms worsen or you have any concerns.  Thank you for allowing me to participate in your care, Shelby Mattocks, DO 09/13/2021, 4:39 PM PGY-2, [facility]

## 2021-09-14 NOTE — Assessment & Plan Note (Addendum)
Due for Pap given LGSIL on previous Pap and colposcopy additionally consistent for this.  Pap performed today.

## 2021-09-14 NOTE — Assessment & Plan Note (Signed)
Pelvic exam and wet prep consistent with BV.  MetroGel daily x5 days.

## 2021-09-14 NOTE — Assessment & Plan Note (Addendum)
Negative pregnancy test.  Not interested in discussing birth control methods at this time.  Advised using barrier protection at for STD prevention.

## 2021-09-16 LAB — CYTOLOGY - PAP
Chlamydia: NEGATIVE
Comment: NEGATIVE
Comment: NEGATIVE
Comment: NORMAL
Diagnosis: UNDETERMINED — AB
High risk HPV: POSITIVE — AB
Neisseria Gonorrhea: NEGATIVE

## 2022-02-04 ENCOUNTER — Other Ambulatory Visit (HOSPITAL_COMMUNITY)
Admission: RE | Admit: 2022-02-04 | Discharge: 2022-02-04 | Disposition: A | Discharge status: Home / Self Care | Payer: Medicaid Other | Source: Ambulatory Visit | Attending: Family Medicine | Admitting: Family Medicine

## 2022-02-04 ENCOUNTER — Ambulatory Visit (INDEPENDENT_AMBULATORY_CARE_PROVIDER_SITE_OTHER): Payer: Medicaid Other | Admitting: Family Medicine

## 2022-02-04 VITALS — BP 115/84 | HR 70 | Ht 63.0 in | Wt 157.2 lb

## 2022-02-04 DIAGNOSIS — Z3009 Encounter for other general counseling and advice on contraception: Secondary | ICD-10-CM

## 2022-02-04 DIAGNOSIS — N898 Other specified noninflammatory disorders of vagina: Secondary | ICD-10-CM | POA: Insufficient documentation

## 2022-02-04 DIAGNOSIS — R87610 Atypical squamous cells of undetermined significance on cytologic smear of cervix (ASC-US): Secondary | ICD-10-CM | POA: Diagnosis not present

## 2022-02-04 DIAGNOSIS — R87810 Cervical high risk human papillomavirus (HPV) DNA test positive: Secondary | ICD-10-CM

## 2022-02-04 LAB — POCT WET PREP (WET MOUNT)
Clue Cells Wet Prep Whiff POC: NEGATIVE
Trichomonas Wet Prep HPF POC: ABSENT

## 2022-02-04 MED ORDER — METRONIDAZOLE 500 MG PO TABS: 500.0000 mg | ORAL_TABLET | Freq: Two times a day (BID) | ORAL | 0 refills | Status: AC

## 2022-02-04 NOTE — Assessment & Plan Note (Signed)
Noted on most recent Pap smear, patient to schedule colposcopy for this

## 2022-02-04 NOTE — Assessment & Plan Note (Signed)
Not interested in contraception at this time.  Counseled on options, copper IUD may be a good option for her.

## 2022-02-04 NOTE — Progress Notes (Signed)
    SUBJECTIVE:   CHIEF COMPLAINT / HPI:  Chief Complaint  Patient presents with   Vaginal odor    Patient reports malodorous vaginal discharge that has been present for at least a few weeks.  Patient thinks she may be having some normal discharge currently.  She denies any vaginal pain, itching.  She has been sexually active with 1 partner, denies any new partners in the past year.  She has been using a vaginal pH suppository and wondering if this is causing her symptoms.  She was most recently treated for BV 4 months ago, reports she did not finish the metronidazole gel because her symptoms improved before completing 5 days of treatment.  She is interested in STI testing today.  She is not interested in contraception this time.  She is concerned about the hormonal effects and link to cancer.  She is not using condoms consistently.  She was on Depo in the past but this was causing breakthrough bleeding.  PERTINENT  PMH / PSH: BV, Pap smear 09/2021 with high risk HPV and ASCUS  Patient Care Team: Levin Erp, MD as PCP - General (Family Medicine)   OBJECTIVE:   BP 115/84   Pulse 70   Ht 5\' 3"  (1.6 m)   Wt 157 lb 3.2 oz (71.3 kg)   LMP 01/18/2022   SpO2 100%   BMI 27.85 kg/m   Physical Exam Exam conducted with a chaperone present.  Constitutional:      General: She is not in acute distress. Cardiovascular:     Rate and Rhythm: Normal rate.  Pulmonary:     Effort: Pulmonary effort is normal. No respiratory distress.  Genitourinary:    Comments: Scant amount of discharge noted at the vulva but otherwise appears normal.  Moderate amount of thin white vaginal discharge noted.  No CMT or adnexal tenderness on bimanual exam. Musculoskeletal:     Cervical back: Neck supple.  Neurological:     Mental Status: She is alert.         02/04/2022    8:32 AM  Depression screen PHQ 2/9  Decreased Interest 0  Down, Depressed, Hopeless 0  PHQ - 2 Score 0  Altered sleeping 0   Tired, decreased energy 0  Change in appetite 0  Feeling bad or failure about yourself  0  Trouble concentrating 0  Moving slowly or fidgety/restless 0  Suicidal thoughts 0  PHQ-9 Score 0  Difficult doing work/chores Not difficult at all     {Show previous vital signs (optional):23777}    ASSESSMENT/PLAN:   Vaginal discharge Wet prep is negative, could either be false negative for BV or physiologic discharge.  Abnormal odor may also be related to OTC boric acid suppository.  Advised stopping OTC suppository, will empirically provide antibiotics for BV treatment if symptoms are not getting better with this. - GC/chlamydia pending - HIV - RPR - stop OTC boric acid suppository - metronidazole pills sent in, can start if symptoms not improving  Birth control counseling Not interested in contraception at this time.  Counseled on options, copper IUD may be a good option for her.  ASCUS with positive high risk HPV cervical Noted on most recent Pap smear, patient to schedule colposcopy for this    Return if symptoms worsen or fail to improve.   4/9, MD [facility]

## 2022-02-04 NOTE — Patient Instructions (Addendum)
It was nice seeing you today!  Try stopping the vaginal pH. If not getting better, you can take the antibiotics for BV treatment.  Check out bedsider.org for more information on birth control options. I recommend condom use in the meantime.  Schedule your colposcopy at the front desk on your way out.  Stay well, Brittney Deeds, MD [facility] [phone]  --  Make sure to check out at the front desk before you leave today.  Please arrive at least 15 minutes prior to your scheduled appointments.  If you had blood work today, I will send you a MyChart message or a letter if results are normal. Otherwise, I will give you a call.  If you had a referral placed, they will call you to set up an appointment. Please give [place] a call if you don't hear back in the next 2 weeks.  If you need additional refills before your next appointment, please call your pharmacy first.

## 2022-02-05 LAB — HIV ANTIBODY (ROUTINE TESTING W REFLEX): HIV Screen 4th Generation wRfx: NONREACTIVE

## 2022-02-05 LAB — RPR: RPR Ser Ql: NONREACTIVE

## 2022-02-08 LAB — CERVICOVAGINAL ANCILLARY ONLY
Chlamydia: NEGATIVE
Comment: NEGATIVE
Comment: NEGATIVE
Comment: NORMAL
Neisseria Gonorrhea: NEGATIVE
Trichomonas: NEGATIVE

## 2022-03-02 ENCOUNTER — Ambulatory Visit: Payer: Medicaid Other | Admitting: Student

## 2022-03-17 ENCOUNTER — Other Ambulatory Visit: Payer: Self-pay | Admitting: Family Medicine

## 2022-03-17 ENCOUNTER — Ambulatory Visit (INDEPENDENT_AMBULATORY_CARE_PROVIDER_SITE_OTHER): Payer: Medicaid Other | Admitting: Student

## 2022-03-17 VITALS — BP 112/80 | HR 70 | Wt 163.0 lb

## 2022-03-17 DIAGNOSIS — R87810 Cervical high risk human papillomavirus (HPV) DNA test positive: Secondary | ICD-10-CM

## 2022-03-17 DIAGNOSIS — N870 Mild cervical dysplasia: Secondary | ICD-10-CM | POA: Diagnosis not present

## 2022-03-17 DIAGNOSIS — R87610 Atypical squamous cells of undetermined significance on cytologic smear of cervix (ASC-US): Secondary | ICD-10-CM

## 2022-03-17 LAB — POCT URINE PREGNANCY: Preg Test, Ur: NEGATIVE

## 2022-03-17 NOTE — Progress Notes (Signed)
Patient ID: Estrella Deeds, female   DOB: 1990/03/31, 32 y.o.   MRN: [id number]  Chief Complaint  Patient presents with   Colposcopy    HPI Brittney [name] is a 32 y.o. female.  Presenting for colposcopy following abnormal pap smear results.   Indications: Pap smear on August 2023 showed: ASCUS with POSITIVE high risk HPV. Previous colposcopy: 03/2020 acetowhite lesion at 11-12 o'clock, pathology resulted normal. Prior cervical treatment: no treatment.  Past Medical History:  Diagnosis Date   [redacted] weeks gestation of pregnancy 10/16/2019   Headache    Maternal iron deficiency anemia complicating pregnancy in third trimester 09/20/2019   Oral iron ordered 09/20/19 CBC Latest Ref Rng & Units 09/19/2019 06/07/2019 05/09/2019 WBC 3.4 - 10.8 x10E3/uL 11.2(H) 10.0 12.1(H) Hemoglobin 11.1 - 15.9 g/dL 10.1(L) 12.1 13.4 Hematocrit 34.0 - 46.6 % 31.3(L) 37.1 39.6 Platelets 150 - 450 x10E3/uL 337 327 388     Precipitous delivery 11/18/2019   Preterm labor     Past Surgical History:  Procedure Laterality Date   NO PAST SURGERIES      Family History  Problem Relation Age of Onset   Diabetes Other    Hypertension Mother    Hypertension Father    Epilepsy Sister    Epilepsy Brother    Diabetes Maternal Aunt     Social History Social History   Tobacco Use   Smoking status: Never    Passive exposure: Never   Smokeless tobacco: Never  Vaping Use   Vaping Use: Never used  Substance Use Topics   Alcohol use: No    Alcohol/week: 0.0 standard drinks of alcohol   Drug use: No    Allergies  Allergen Reactions   Amoxicillin Hives   Latex Itching and Rash    Current Outpatient Medications  Medication Sig Dispense Refill   ferrous sulfate (FERROUSUL) 325 (65 FE) MG tablet Take 1 tablet (325 mg total) by mouth 2 (two) times daily. (Patient not taking: No sig reported) 60 tablet 3   ibuprofen (ADVIL) 600 MG tablet Take 1 tablet (600 mg total) by mouth every 6 (six) hours. (Patient not  taking: No sig reported) 30 tablet 0   loratadine (CLARITIN) 10 MG tablet Take 1 tablet (10 mg total) by mouth daily. (Patient not taking: No sig reported) 30 tablet 11   medroxyPROGESTERone (DEPO-PROVERA) 150 MG/ML injection Inject 150 mg into the muscle every 3 (three) months. (Patient not taking: Reported on 09/13/2021)     Prenatal Vit-Fe Phos-FA-Omega (VITAFOL GUMMIES) 3.33-0.333-34.8 MG CHEW Chew 3 tablets by mouth daily before breakfast. (Patient not taking: Reported on 03/31/2020) 90 tablet 11   No current facility-administered medications for this visit.    Review of Systems Review of Systems  Constitutional:  Negative for chills and fever.  Genitourinary:  Negative for dysuria, flank pain, hematuria, menstrual problem, pelvic pain and vaginal bleeding.     Blood pressure 112/80, pulse 70, weight 163 lb (73.9 kg), last menstrual period 02/14/2022, SpO2 98 %.  Physical Exam Physical Exam Exam conducted with a chaperone present.  Genitourinary:    General: Normal vulva.     Exam position: Lithotomy position.     Pubic Area: No rash.      Labia:        Right: No rash, lesion or injury.        Left: No rash, lesion or injury.      Vagina: Normal.     Cervix: Discharge, lesion and cervical bleeding present.  Lymphadenopathy:     Lower Body: No right inguinal adenopathy. No left inguinal adenopathy.     Data Reviewed Previous pap smear and colposcopy   Assessment    Procedure Details  The risks and benefits of the procedure and Written informed consent obtained.  Speculum placed in vagina and excellent visualization of cervix achieved, cervix swabbed x 3 with acetic acid solution.  Specimens: ECC biopsy, biopsy at 5 o'clock   Complications: none.     Plan Colposcopy completed today with supervision by Dr. Gwendlyn Deutscher.  Biopsy obtained from 5 o'clock lesion.  Patient tolerated procedure well. After care instructions provided and return precautions given to patient.    Specimens labelled and sent to Pathology. Return to discuss Pathology results in 2 weeks.      Darci Current 03/17/2022, 9:48 AM

## 2022-03-17 NOTE — Patient Instructions (Signed)
Colposcopy, Care After  The following information offers guidance on how to care for yourself after your procedure. Your doctor may also give you more specific instructions. If you have problems or questions, contact your doctor. What can I expect after the procedure? If you did not have a sample of your tissue taken out (did not have a biopsy), you may only have some spotting of blood for a few days. You can go back to your normal activities. If you had a sample of your tissue taken out, it is common to have: Soreness and mild pain. These may last for a few days. Mild bleeding or fluid (discharge) coming from your vagina. The fluid will look dark and grainy. You may have this for a few days. The fluid may be caused by a liquid that was used during your procedure. You may need to wear a sanitary pad. Spotting of blood for at least 48 hours after the procedure. Follow these instructions at home: Medicines Take over-the-counter and prescription medicines only as told by your doctor. Ask your doctor what over-the-counter pain medicines and prescription medicines you can start taking again. This is very important if you take blood thinners. Activity For at least 3 days, or for as long as told by your doctor, avoid: Douching. Using tampons. Having sex. Return to your normal activities as told by your doctor. Ask your doctor what activities are safe for you. General instructions Ask your doctor if you may take baths, swim, or use a hot tub. You may take showers. If you use birth control (contraception), keep using it. Keep all follow-up visits. Contact a doctor if: You have a fever or chills. You faint or feel light-headed. Get help right away if: You bleed a lot from your vagina. A lot of bleeding means that the bleeding soaks through a pad in less than 1 hour. You have clumps of blood (blood clots) coming from your vagina. You have signs that could mean you have an infection. This may be  fluid coming from your vagina that is: Different than normal. Yellow. Bad-smelling. You have very bad pain or cramps in your lower belly that do not get better with medicine. Summary If you did not have a sample of your tissue taken out, you may only have some spotting of blood for a few days. You can go back to your normal activities. If you had a sample of your tissue taken out, it is common to have mild pain for a few days and spotting for 48 hours. Avoid douching, using tampons, and having sex for at least 3 days after the procedure or for as long as told. Get help right away if you have a lot of bleeding, very bad pain, or signs of infection. This information is not intended to replace advice given to you by your health care provider. Make sure you discuss any questions you have with your health care provider. Document Revised: 06/21/2020 Document Reviewed: 06/21/2020 Elsevier Patient Education  2023 Elsevier Inc.  

## 2022-03-17 NOTE — Assessment & Plan Note (Signed)
Colposcopy completed today with supervision by Dr. Gwendlyn Deutscher. Biopsy obtained from 5 o'clock lesion. Patient tolerated procedure well. After care instructions provided and return precautions given to patient.

## 2022-03-23 ENCOUNTER — Telehealth: Payer: Self-pay | Admitting: Student

## 2022-03-23 NOTE — Telephone Encounter (Signed)
Patient returns call to nurse line.   Pap Smear results discussed with patient and the need to repeat in one year.   All questions answered.

## 2022-03-23 NOTE — Telephone Encounter (Signed)
Called and left HIPAA compliant voicemail. Patient's colposcopy results came back indicating a low risk change to her cervix (Low grade squamous intraepithelial lesion). The recommendation is to follow up in one year with a pap smear w/ HPV testing.   Darci Current, DO [facility], PGY-1 03/23/22 9:58 AM

## 2022-07-16 ENCOUNTER — Inpatient Hospital Stay (HOSPITAL_COMMUNITY): Payer: Medicaid Other

## 2022-07-16 ENCOUNTER — Encounter (HOSPITAL_COMMUNITY): Payer: Self-pay

## 2022-07-16 ENCOUNTER — Inpatient Hospital Stay (HOSPITAL_COMMUNITY)
Admission: AD | Admit: 2022-07-16 | Discharge: 2022-07-16 | Disposition: A | Discharge status: Home / Self Care | Payer: Medicaid Other | Attending: Obstetrics and Gynecology | Admitting: Obstetrics and Gynecology

## 2022-07-16 DIAGNOSIS — N760 Acute vaginitis: Secondary | ICD-10-CM | POA: Insufficient documentation

## 2022-07-16 DIAGNOSIS — Z3A01 Less than 8 weeks gestation of pregnancy: Secondary | ICD-10-CM | POA: Diagnosis not present

## 2022-07-16 DIAGNOSIS — B9689 Other specified bacterial agents as the cause of diseases classified elsewhere: Secondary | ICD-10-CM | POA: Diagnosis not present

## 2022-07-16 DIAGNOSIS — Z3491 Encounter for supervision of normal pregnancy, unspecified, first trimester: Secondary | ICD-10-CM

## 2022-07-16 DIAGNOSIS — R109 Unspecified abdominal pain: Secondary | ICD-10-CM | POA: Diagnosis not present

## 2022-07-16 DIAGNOSIS — O26891 Other specified pregnancy related conditions, first trimester: Secondary | ICD-10-CM | POA: Diagnosis not present

## 2022-07-16 DIAGNOSIS — O209 Hemorrhage in early pregnancy, unspecified: Secondary | ICD-10-CM | POA: Diagnosis present

## 2022-07-16 DIAGNOSIS — Z3490 Encounter for supervision of normal pregnancy, unspecified, unspecified trimester: Secondary | ICD-10-CM

## 2022-07-16 LAB — ABO/RH: ABO/RH(D): O POS

## 2022-07-16 LAB — POCT PREGNANCY, URINE: Preg Test, Ur: POSITIVE — AB

## 2022-07-16 LAB — WET PREP, GENITAL
Sperm: NONE SEEN
Trich, Wet Prep: NONE SEEN
WBC, Wet Prep HPF POC: >=10 — AB (ref <10)
Yeast Wet Prep HPF POC: NONE SEEN

## 2022-07-16 LAB — CBC
HCT: 36.2 % (ref 36.0–46.0)
Hemoglobin: 12 g/dL (ref 12.0–15.0)
MCH: 29.6 pg (ref 26.0–34.0)
MCHC: 33.1 g/dL (ref 30.0–36.0)
MCV: 89.2 fL (ref 80.0–100.0)
Platelets: 328 10*3/uL (ref 150–400)
RBC: 4.06 MIL/uL (ref 3.87–5.11)
RDW: 12.8 % (ref 11.5–15.5)
WBC: 7.9 10*3/uL (ref 4.0–10.5)
nRBC: 0 % (ref 0.0–0.2)

## 2022-07-16 LAB — HCG, QUANTITATIVE, PREGNANCY: hCG, Beta Chain, Quant, S: 25024 m[IU]/mL — ABNORMAL HIGH (ref <5)

## 2022-07-16 MED ORDER — METRONIDAZOLE 1 % EX GEL: Freq: Every day | CUTANEOUS | 0 refills | Status: DC

## 2022-07-16 NOTE — MAU Provider Note (Signed)
History     CSN: [id number]  Arrival date and time: 07/16/22 0947   Chief Complaint  Patient presents with   Pelvic Pain   Vaginal Bleeding   HPI  Brittney [name] is a 32 y.o. [id number] at approximately [redacted] weeks gestation who presents for evaluation of vaginal bleeding and cramping. Patient reports she is seeing spotting on the tissue when she wipes. She reports she had an irregular period in May and has been having pelvic cramping since then. Patient rates the pain as a 6/10 and has not tried anything for the pain. She denies any discharge. Denies any constipation, diarrhea or any urinary complaints. OB History     Gravida  8   Para  4   Term  2   Preterm  2   AB  3   Living  4      SAB  2   IAB      Ectopic  1   Multiple  0   Live Births  4           Past Medical History:  Diagnosis Date   [redacted] weeks gestation of pregnancy 10/16/2019   Headache    Maternal iron deficiency anemia complicating pregnancy in third trimester 09/20/2019   Oral iron ordered 09/20/19 CBC Latest Ref Rng & Units 09/19/2019 06/07/2019 05/09/2019 WBC 3.4 - 10.8 x10E3/uL 11.2(H) 10.0 12.1(H) Hemoglobin 11.1 - 15.9 g/dL 10.1(L) 12.1 13.4 Hematocrit 34.0 - 46.6 % 31.3(L) 37.1 39.6 Platelets 150 - 450 x10E3/uL 337 327 388     Precipitous delivery 11/18/2019   Preterm labor     Past Surgical History:  Procedure Laterality Date   NO PAST SURGERIES      Family History  Problem Relation Age of Onset   Diabetes Other    Hypertension Mother    Hypertension Father    Epilepsy Sister    Epilepsy Brother    Diabetes Maternal Aunt     Social History   Tobacco Use   Smoking status: Never    Passive exposure: Never   Smokeless tobacco: Never  Vaping Use   Vaping Use: Never used  Substance Use Topics   Alcohol use: No    Alcohol/week: 0.0 standard drinks of alcohol   Drug use: No    Allergies:  Allergies  Allergen Reactions   Amoxicillin Hives   Latex Itching and Rash    No  medications prior to admission.    Review of Systems  Constitutional: Negative.  Negative for fatigue and fever.  HENT: Negative.    Respiratory: Negative.  Negative for shortness of breath.   Cardiovascular: Negative.  Negative for chest pain.  Gastrointestinal: Negative.  Negative for abdominal pain, constipation, diarrhea, nausea and vomiting.  Genitourinary:  Positive for pelvic pain and vaginal bleeding. Negative for dysuria and vaginal discharge.  Neurological: Negative.  Negative for dizziness and headaches.   Physical Exam   Blood pressure 121/70, pulse 98, temperature 98.2 F (36.8 C), temperature source Oral, resp. rate 14, height 5\' 3"  (1.6 m), weight 74 kg, last menstrual period 02/14/2022, SpO2 99 %.  Patient Vitals for the past 24 hrs:  BP Temp Temp src Pulse Resp SpO2 Height Weight  07/16/22 1006 121/70 98.2 F (36.8 C) Oral 98 14 99 % 5\' 3"  (1.6 m) 74 kg    Physical Exam Vitals and nursing note reviewed.  Constitutional:      General: She is not in acute distress.    Appearance: She is well-developed.  HENT:     Head: Normocephalic.  Eyes:     Pupils: Pupils are equal, round, and reactive to light.  Cardiovascular:     Rate and Rhythm: Normal rate and regular rhythm.     Heart sounds: Normal heart sounds.  Pulmonary:     Effort: Pulmonary effort is normal. No respiratory distress.     Breath sounds: Normal breath sounds.  Abdominal:     General: Bowel sounds are normal. There is no distension.     Palpations: Abdomen is soft.     Tenderness: There is no abdominal tenderness.  Skin:    General: Skin is warm and dry.  Neurological:     Mental Status: She is alert and oriented to person, place, and time.  Psychiatric:        Mood and Affect: Mood normal.        Behavior: Behavior normal.        Thought Content: Thought content normal.        Judgment: Judgment normal.      MAU Course  Procedures  Results for orders placed or performed during the  hospital encounter of 07/16/22 (from the past 24 hour(s))  Pregnancy, urine POC     Status: Abnormal   Collection Time: 07/16/22  9:59 AM  Result Value Ref Range   Preg Test, Ur POSITIVE (A) NEGATIVE  CBC     Status: None   Collection Time: 07/16/22 10:15 AM  Result Value Ref Range   WBC 7.9 4.0 - 10.5 K/uL   RBC 4.06 3.87 - 5.11 MIL/uL   Hemoglobin 12.0 12.0 - 15.0 g/dL   HCT 65.7 84.6 - 96.2 %   MCV 89.2 80.0 - 100.0 fL   MCH 29.6 26.0 - 34.0 pg   MCHC 33.1 30.0 - 36.0 g/dL   RDW 95.2 84.1 - 32.4 %   Platelets 328 150 - 400 K/uL   nRBC 0.0 0.0 - 0.2 %  hCG, quantitative, pregnancy     Status: Abnormal   Collection Time: 07/16/22 10:15 AM  Result Value Ref Range   hCG, Beta Chain, Quant, S 25,024 (H) <5 mIU/mL  ABO/Rh     Status: None   Collection Time: 07/16/22 10:19 AM  Result Value Ref Range   ABO/RH(D) O POS    No rh immune globuloin      NOT A RH IMMUNE GLOBULIN CANDIDATE, PT RH POSITIVE Performed at [facility], [address], [place], [place] [address]   Wet prep, genital     Status: Abnormal   Collection Time: 07/16/22 11:04 AM  Result Value Ref Range   Yeast Wet Prep HPF POC NONE SEEN NONE SEEN   Trich, Wet Prep NONE SEEN NONE SEEN   Clue Cells Wet Prep HPF POC PRESENT (A) NONE SEEN   WBC, Wet Prep HPF POC >=10 (A) <10   Sperm NONE SEEN      US OB LESS THAN 14 WEEKS WITH OB TRANSVAGINAL  Result Date: 07/16/2022 CLINICAL DATA:  Abdominal pain in pregnancy EXAM: OBSTETRIC <14 WK [place] AND TRANSVAGINAL OB US TECHNIQUE: Both transabdominal and transvaginal ultrasound examinations were performed for complete evaluation of the gestation as well as the maternal uterus, adnexal regions, and pelvic cul-de-sac. Transvaginal technique was performed to assess early pregnancy. COMPARISON:  None Available. FINDINGS: Intrauterine gestational sac: Single Yolk sac:  Seen Embryo:  Not seen Cardiac Activity: Not seen MSD: 12.1 mm   6 w   0 d Subchorionic hemorrhage:  None  visualized. Maternal uterus/adnexae: The uterus is retroverted. There is 2.2 cm cyst in right ovary. There is 1.9 cm cyst in the left ovary. IMPRESSION: There is a gestational sac containing yolk sac within the uterus. By mean sac diameter measurement, estimated gestational age is 6 weeks. There is no demonstrable fetal pole or fetal cardiac activity. Findings may suggest very early intrauterine pregnancy or failed gestation with incomplete abortion. Serial HCG estimations and follow-up pelvic sonogram in 1-2 weeks may be considered. Electronically Signed   By: Ernie Avena M.D.   On: 07/16/2022 11:45     MDM Labs ordered and reviewed.   UA, UPT CBC, HCG ABO/Rh- O Pos Wet prep and gc/chlamydia US OB Comp Less 14 weeks with Transvaginal   CNM independently reviewed the imaging ordered. Imaging show gestational sac with yolk sac in the uterus  CNM discussed recommendation for repeat ultrasound in 2 weeks. Patient states she will follow up with her OB for this  Assessment and Plan   1. Early stage of pregnancy   2. Normal intrauterine pregnancy on prenatal ultrasound in first trimester   3. [redacted] weeks gestation of pregnancy   4. Bacterial vaginosis     -Discharge home in stable condition -Rx for metrogel sent to pharmacy -First trimester precautions discussed -Patient advised to follow-up with OB as scheduled for prenatal care -Patient may return to MAU as needed or if her condition were to change or worsen  Rolm Bookbinder, CNM 07/16/2022, 1:21 PM

## 2022-07-16 NOTE — Discharge Instructions (Signed)

## 2022-07-16 NOTE — MAU Note (Signed)
...  Brittney [name] is a 32 y.o. at Unknown here in MAU reporting: Ongoing pelvic cramping since 5/3. She reports the first day of her last "true" period was 4/6. She reports she then bled again on 5/3 through 5/5 and her bleeding stopped abruptly. She reports her periods are usually 5-7 days. She reports during those three days the bleeding was heavy like her period with few blood clots. She reports she has had continual pelvic cramping since 5/3. She reports yesterday she noted light pink vaginal spotting yesterday evening and again this morning. Last IC 6/2. Denies vaginal itching and vaginal odors. Denies urinary sx's.  First positive UPT x2 days ago. Had negative tests 5/6.  LMP: 05/14/2022 or 06/10/2022  Onset of complaint: 5/3 - pain. 6/7 spotting Pain score: 5/10 pelvis - middle  Lab orders placed from triage:  POCT Preg, UA

## 2022-07-18 LAB — GC/CHLAMYDIA PROBE AMP (~~LOC~~) NOT AT ARMC
Chlamydia: NEGATIVE
Comment: NEGATIVE
Comment: NORMAL
Neisseria Gonorrhea: NEGATIVE

## 2022-08-22 DIAGNOSIS — Z30013 Encounter for initial prescription of injectable contraceptive: Secondary | ICD-10-CM | POA: Diagnosis not present

## 2022-10-18 ENCOUNTER — Emergency Department (HOSPITAL_COMMUNITY)
Admission: EM | Admit: 2022-10-18 | Discharge: 2022-10-18 | Disposition: A | Discharge status: Home / Self Care | Payer: Medicaid Other | Attending: Emergency Medicine | Admitting: Emergency Medicine

## 2022-10-18 ENCOUNTER — Other Ambulatory Visit: Payer: Self-pay

## 2022-10-18 ENCOUNTER — Encounter (HOSPITAL_COMMUNITY): Payer: Self-pay | Admitting: Emergency Medicine

## 2022-10-18 DIAGNOSIS — L0501 Pilonidal cyst with abscess: Secondary | ICD-10-CM | POA: Diagnosis not present

## 2022-10-18 DIAGNOSIS — D72829 Elevated white blood cell count, unspecified: Secondary | ICD-10-CM | POA: Diagnosis not present

## 2022-10-18 DIAGNOSIS — K611 Rectal abscess: Secondary | ICD-10-CM | POA: Diagnosis present

## 2022-10-18 DIAGNOSIS — Z91040 Latex allergy status: Secondary | ICD-10-CM | POA: Diagnosis not present

## 2022-10-18 LAB — URINALYSIS, ROUTINE W REFLEX MICROSCOPIC
Bilirubin Urine: NEGATIVE
Glucose, UA: NEGATIVE mg/dL
Hgb urine dipstick: NEGATIVE
Ketones, ur: NEGATIVE mg/dL
Leukocytes,Ua: NEGATIVE
Nitrite: NEGATIVE
Protein, ur: NEGATIVE mg/dL
Specific Gravity, Urine: 1.028 (ref 1.005–1.030)
pH: 5 (ref 5.0–8.0)

## 2022-10-18 LAB — CBC
HCT: 37.2 % (ref 36.0–46.0)
Hemoglobin: 12 g/dL (ref 12.0–15.0)
MCH: 29.3 pg (ref 26.0–34.0)
MCHC: 32.3 g/dL (ref 30.0–36.0)
MCV: 90.7 fL (ref 80.0–100.0)
Platelets: 332 10*3/uL (ref 150–400)
RBC: 4.1 MIL/uL (ref 3.87–5.11)
RDW: 12.2 % (ref 11.5–15.5)
WBC: 14.6 10*3/uL — ABNORMAL HIGH (ref 4.0–10.5)
nRBC: 0 % (ref 0.0–0.2)

## 2022-10-18 LAB — PREGNANCY, URINE: Preg Test, Ur: NEGATIVE

## 2022-10-18 LAB — BASIC METABOLIC PANEL
Anion gap: 10 (ref 5–15)
BUN: 8 mg/dL (ref 6–20)
CO2: 22 mmol/L (ref 22–32)
Calcium: 9.2 mg/dL (ref 8.9–10.3)
Chloride: 106 mmol/L (ref 98–111)
Creatinine, Ser: 0.8 mg/dL (ref 0.44–1.00)
GFR, Estimated: >60 mL/min (ref >60)
Glucose, Bld: 104 mg/dL — ABNORMAL HIGH (ref 70–99)
Potassium: 3.6 mmol/L (ref 3.5–5.1)
Sodium: 138 mmol/L (ref 135–145)

## 2022-10-18 MED ORDER — LIDOCAINE-EPINEPHRINE (PF) 2 %-1:200000 IJ SOLN
10.0000 mL | Freq: Once | INTRAMUSCULAR | Status: AC
  Administered 2022-10-18: 10 mL via INTRADERMAL
  Filled 2022-10-18: qty 20

## 2022-10-18 MED ORDER — DOXYCYCLINE HYCLATE 100 MG PO CAPS: 100.0000 mg | ORAL_CAPSULE | Freq: Two times a day (BID) | ORAL | 0 refills | Status: DC

## 2022-10-18 MED ORDER — CEPHALEXIN 500 MG PO CAPS: 500.0000 mg | ORAL_CAPSULE | Freq: Four times a day (QID) | ORAL | 0 refills | Status: DC

## 2022-10-18 MED ORDER — DOXYCYCLINE HYCLATE 100 MG PO TABS
100.0000 mg | ORAL_TABLET | Freq: Once | ORAL | Status: AC
  Administered 2022-10-18: 100 mg via ORAL
  Filled 2022-10-18: qty 1

## 2022-10-18 MED ORDER — BACITRACIN ZINC 500 UNIT/GM EX OINT
TOPICAL_OINTMENT | Freq: Two times a day (BID) | CUTANEOUS | Status: DC
  Administered 2022-10-18: 1 via TOPICAL
  Filled 2022-10-18: qty 0.9

## 2022-10-18 NOTE — Discharge Instructions (Addendum)
We saw you in the ER for the abscess. We drained the abscess but want you to return in 2 days for a wound check and to see a primary care doctor within a week to ensure your abscess is healing.  Please take the antibiotics I have prescribed to help decrease the chance of infection.  Keep the wound clean and dry otherwise, and allow for any drainage to occur. Please follow up with primary care provider to be rechecked in the next week to ensure you are healing properly. If symptoms change or worsen, please return to ER.

## 2022-10-18 NOTE — ED Provider Notes (Signed)
[place] EMERGENCY DEPARTMENT AT [facility] Provider Note   CSN: [id number] Arrival date & time: 10/18/22  0355     History  Chief Complaint  Patient presents with   Cyst    Brittney [name] is a 32 y.o. female presented with rectal abscess for the past week.  Patient states she has never had any of these before and that hurts to lie down she is persistently uncomfortable and has been getting worse.  Patient is able to still have bowel movements without issue and denies any fevers, abdominal pain, nausea/vomiting, dysuria, change in sensation/motor skills, hematochezia/melena.  Home Medications Prior to Admission medications   Medication Sig Start Date End Date Taking? Authorizing Provider  doxycycline (VIBRAMYCIN) 100 MG capsule Take 1 capsule (100 mg total) by mouth 2 (two) times daily. 10/18/22  Yes Corey Caulfield, Beverly Gust, PA-C  ferrous sulfate (FERROUSUL) 325 (65 FE) MG tablet Take 1 tablet (325 mg total) by mouth 2 (two) times daily. Patient not taking: No sig reported 09/20/19   Anyanwu, Jethro Bastos, MD  ibuprofen (ADVIL) 600 MG tablet Take 1 tablet (600 mg total) by mouth every 6 (six) hours. Patient not taking: No sig reported 11/20/19   Aviva Signs, CNM  loratadine (CLARITIN) 10 MG tablet Take 1 tablet (10 mg total) by mouth daily. Patient not taking: No sig reported 06/07/19   Brock Bad, MD  metroNIDAZOLE (METROGEL) 1 % gel Apply topically daily. 07/16/22   Rolm Bookbinder, CNM  Prenatal Vit-Fe Phos-FA-Omega (VITAFOL GUMMIES) 3.33-0.333-34.8 MG CHEW Chew 3 tablets by mouth daily before breakfast. Patient not taking: Reported on 03/31/2020 06/07/19   Brock Bad, MD      Allergies    Amoxicillin and Latex    Review of Systems   Review of Systems  Physical Exam Updated Vital Signs BP 111/77   Pulse 88   Temp 98 F (36.7 C) (Oral)   Resp 16   Ht 5\' 3"  (1.6 m)   Wt 74.8 kg   LMP 02/14/2022   SpO2 100%   Breastfeeding Unknown   BMI 29.23  kg/m  Physical Exam Constitutional:      Appearance: She is not ill-appearing or toxic-appearing.     Comments: Obviously uncomfortable but does not appear in distress  Cardiovascular:     Rate and Rhythm: Normal rate and regular rhythm.     Pulses: Normal pulses.  Abdominal:     Palpations: Abdomen is soft.     Tenderness: There is no abdominal tenderness. There is no guarding or rebound.  Genitourinary:    Comments: Chaperone: Tereeta, NT Pilonidal abscess noted as tender to palpation No other rectal abnormalities noted Hard to differentiate if there is skin color changes but abscess is warm to the touch and fluctuant Musculoskeletal:        General: Normal range of motion.  Skin:    General: Skin is dry.     Capillary Refill: Capillary refill takes less than 2 seconds.  Neurological:     Mental Status: She is alert.     ED Results / Procedures / Treatments   Labs (all labs ordered are listed, but only abnormal results are displayed) Labs Reviewed  BASIC METABOLIC PANEL - Abnormal; Notable for the following components:      Result Value   Glucose, Bld 104 (*)    All other components within normal limits  CBC - Abnormal; Notable for the following components:   WBC 14.6 (*)  All other components within normal limits  URINALYSIS, ROUTINE W REFLEX MICROSCOPIC - Abnormal; Notable for the following components:   APPearance HAZY (*)    All other components within normal limits  PREGNANCY, URINE    EKG None  Radiology No results found.  Procedures Ultrasound ED Soft Tissue  Date/Time: 10/18/2022 7:41 AM  Performed by: Netta Corrigan, PA-C Authorized by: Netta Corrigan, PA-C   Procedure details:    Indications: localization of abscess     Transverse view:  Visualized   Longitudinal view:  Visualized   Images: archived   Location:    Location: buttocks     Side:  Midline Findings:     abscess present    no cellulitis present    no foreign body  present .Marland KitchenIncision and Drainage  Date/Time: 10/18/2022 8:39 AM  Performed by: Netta Corrigan, PA-C Authorized by: Netta Corrigan, PA-C   Consent:    Consent obtained:  Verbal   Consent given by:  Patient   Risks, benefits, and alternatives were discussed: yes     Risks discussed:  Bleeding, damage to other organs, incomplete drainage, infection and pain Universal protocol:    Procedure explained and questions answered to patient or proxy's satisfaction: yes     Patient identity confirmed:  Verbally with patient Location:    Type:  Abscess   Size:  3 cm   Location:  Anogenital   Anogenital location:  Pilonidal Pre-procedure details:    Skin preparation:  Povidone-iodine Sedation:    Sedation type:  None Anesthesia:    Anesthesia method:  Local infiltration   Local anesthetic:  Lidocaine 2% WITH epi Procedure type:    Complexity:  Complex Procedure details:    Ultrasound guidance: no     Needle aspiration: no     Incision types:  Single straight   Incision depth:  Dermal   Wound management:  Probed and deloculated and irrigated with saline   Drainage:  Purulent (Malodorous)   Drainage amount:  Copious   Wound treatment:  Wound left open   Packing materials:  None Post-procedure details:    Procedure completion:  Tolerated     Medications Ordered in ED Medications  bacitracin ointment (has no administration in time range)  doxycycline (VIBRA-TABS) tablet 100 mg (has no administration in time range)  lidocaine-EPINEPHrine (XYLOCAINE W/EPI) 2 %-1:200000 (PF) injection 10 mL (10 mLs Intradermal Given 10/18/22 0747)    ED Course/ Medical Decision Making/ A&P                                 Medical Decision Making Amount and/or Complexity of Data Reviewed Labs: ordered.   Otho Najjar 32 y.o. presented today for buttock cyst. Working DDx that I considered at this time includes, but not limited to, pilonidal cyst/abscess, anal fissure, external hemorrhoid,  Crohn's disease, fistula.  R/o DDx: anal fissure, external hemorrhoid, Crohn's disease, fistula: These are considered less likely due to history of present illness, physical exam, labs/imaging findings  Review of prior external notes:  07/16/2022 discharge  Unique Tests and My Interpretation: CBC: Leukocytosis 14.6 BMP: Unremarkable UA: Negative Urine pregnancy: Negative  Discussion with Independent Historian: None  Discussion of Management of Tests: None  Risk: Medium: prescription drug management  Risk Stratification Score: None  Plan: On exam patient was very uncomfortable but no acute distress with stable vitals.  With a chaperone the room a rectal  exam was conducted which does show a pilonidal abscess.  Ultrasound at the bedside confirmed.  Patient was consented for I&D and I&D was conducted which did show a large amount of purulent material with malodor.  Post I&D patient's abscess appears much better patient does appear more comfortable in the room.  Patient was given doxycycline and encouraged to follow-up in 2 days for a wound check and to follow-up with her primary care provider.  Packing not placed as there is only a small incision.  Patient's LMP in the system states that it was 02/14/2022 and when I spoke to the patient patient that she was not breast-feeding and was not pregnant.  Patient states that she had an abortion back in May and since then has been on the Depo shot and is okay with receiving doxycycline.  Patient given 1 dose of the antibiotic here and will be prescribed the rest.  Patient will have her wound covered with bacitracin ointment and encouraged to use heat packs and to monitor her incision.  Patient was given return precautions. Patient stable for discharge at this time.  Patient verbalized understanding of plan.         Final Clinical Impression(s) / ED Diagnoses Final diagnoses:  Pilonidal abscess    Rx / DC Orders ED Discharge Orders           Ordered    cephALEXin (KEFLEX) 500 MG capsule  4 times daily,   Status:  Discontinued        10/18/22 0754    doxycycline (VIBRAMYCIN) 100 MG capsule  2 times daily        10/18/22 0835              Netta Corrigan, PA-C 10/18/22 7035    Tegeler, Canary Brim, MD 10/18/22 [phone]

## 2022-10-18 NOTE — ED Triage Notes (Signed)
Patient coming to ED for evaluation of possible cyst to buttock.  Reports she had diarrhea a week ago.  After diarrhea resolved she developed a "bump" in "buttock crack."  Reports area has become bigger and move inflamed.

## 2022-10-21 ENCOUNTER — Inpatient Hospital Stay: Payer: Medicaid Other

## 2022-10-21 NOTE — Progress Notes (Deleted)
    SUBJECTIVE:   CHIEF COMPLAINT / HPI: ED follow-up  Was seen in ED on 9/10 for pilonidal abscess This was drained in the ED and patient was given a doxycycline course and told to follow-up with PCP for wound check No packing was placed  PERTINENT  PMH / PSH: ***  OBJECTIVE:   LMP 02/14/2022   ***  ASSESSMENT/PLAN:   No problem-specific Assessment & Plan notes found for this encounter.     Levin Erp, MD [facility]

## 2022-11-29 DIAGNOSIS — Z719 Counseling, unspecified: Secondary | ICD-10-CM | POA: Diagnosis not present

## 2022-11-29 DIAGNOSIS — Z7251 High risk heterosexual behavior: Secondary | ICD-10-CM | POA: Diagnosis not present

## 2022-11-29 DIAGNOSIS — L292 Pruritus vulvae: Secondary | ICD-10-CM | POA: Diagnosis not present

## 2022-11-29 DIAGNOSIS — B3731 Acute candidiasis of vulva and vagina: Secondary | ICD-10-CM | POA: Diagnosis not present

## 2022-11-29 DIAGNOSIS — N898 Other specified noninflammatory disorders of vagina: Secondary | ICD-10-CM | POA: Diagnosis not present

## 2022-11-29 DIAGNOSIS — Z113 Encounter for screening for infections with a predominantly sexual mode of transmission: Secondary | ICD-10-CM | POA: Diagnosis not present

## 2022-11-29 DIAGNOSIS — N771 Vaginitis, vulvitis and vulvovaginitis in diseases classified elsewhere: Secondary | ICD-10-CM | POA: Diagnosis not present

## 2022-12-10 ENCOUNTER — Encounter (HOSPITAL_BASED_OUTPATIENT_CLINIC_OR_DEPARTMENT_OTHER): Payer: Self-pay | Admitting: Emergency Medicine

## 2022-12-10 ENCOUNTER — Emergency Department (HOSPITAL_BASED_OUTPATIENT_CLINIC_OR_DEPARTMENT_OTHER)
Admission: EM | Admit: 2022-12-10 | Discharge: 2022-12-10 | Disposition: A | Discharge status: Home / Self Care | Payer: Medicaid Other | Attending: Emergency Medicine | Admitting: Emergency Medicine

## 2022-12-10 ENCOUNTER — Emergency Department (HOSPITAL_BASED_OUTPATIENT_CLINIC_OR_DEPARTMENT_OTHER): Payer: Medicaid Other | Admitting: Radiology

## 2022-12-10 ENCOUNTER — Other Ambulatory Visit: Payer: Self-pay

## 2022-12-10 DIAGNOSIS — R0781 Pleurodynia: Secondary | ICD-10-CM | POA: Diagnosis not present

## 2022-12-10 DIAGNOSIS — J029 Acute pharyngitis, unspecified: Secondary | ICD-10-CM | POA: Diagnosis present

## 2022-12-10 DIAGNOSIS — R079 Chest pain, unspecified: Secondary | ICD-10-CM | POA: Insufficient documentation

## 2022-12-10 DIAGNOSIS — B349 Viral infection, unspecified: Secondary | ICD-10-CM | POA: Insufficient documentation

## 2022-12-10 DIAGNOSIS — Z20822 Contact with and (suspected) exposure to covid-19: Secondary | ICD-10-CM | POA: Insufficient documentation

## 2022-12-10 DIAGNOSIS — M545 Low back pain, unspecified: Secondary | ICD-10-CM | POA: Diagnosis not present

## 2022-12-10 DIAGNOSIS — R630 Anorexia: Secondary | ICD-10-CM | POA: Diagnosis not present

## 2022-12-10 DIAGNOSIS — Z91040 Latex allergy status: Secondary | ICD-10-CM | POA: Diagnosis not present

## 2022-12-10 DIAGNOSIS — R0789 Other chest pain: Secondary | ICD-10-CM | POA: Diagnosis not present

## 2022-12-10 LAB — CBC
HCT: 37.8 % (ref 36.0–46.0)
Hemoglobin: 12.5 g/dL (ref 12.0–15.0)
MCH: 29.8 pg (ref 26.0–34.0)
MCHC: 33.1 g/dL (ref 30.0–36.0)
MCV: 90 fL (ref 80.0–100.0)
Platelets: 320 10*3/uL (ref 150–400)
RBC: 4.2 MIL/uL (ref 3.87–5.11)
RDW: 13.9 % (ref 11.5–15.5)
WBC: 8.7 10*3/uL (ref 4.0–10.5)
nRBC: 0 % (ref 0.0–0.2)

## 2022-12-10 LAB — BASIC METABOLIC PANEL
Anion gap: 8 (ref 5–15)
BUN: 8 mg/dL (ref 6–20)
CO2: 26 mmol/L (ref 22–32)
Calcium: 9.8 mg/dL (ref 8.9–10.3)
Chloride: 105 mmol/L (ref 98–111)
Creatinine, Ser: 0.83 mg/dL (ref 0.44–1.00)
GFR, Estimated: >60 mL/min (ref >60)
Glucose, Bld: 86 mg/dL (ref 70–99)
Potassium: 3.5 mmol/L (ref 3.5–5.1)
Sodium: 139 mmol/L (ref 135–145)

## 2022-12-10 LAB — GROUP A STREP BY PCR: Group A Strep by PCR: NOT DETECTED

## 2022-12-10 LAB — PREGNANCY, URINE: Preg Test, Ur: NEGATIVE

## 2022-12-10 LAB — RESP PANEL BY RT-PCR (RSV, FLU A&B, COVID)  RVPGX2
Influenza A by PCR: NEGATIVE
Influenza B by PCR: NEGATIVE
Resp Syncytial Virus by PCR: NEGATIVE
SARS Coronavirus 2 by RT PCR: NEGATIVE

## 2022-12-10 LAB — TROPONIN I (HIGH SENSITIVITY): Troponin I (High Sensitivity): 2 ng/L (ref <18)

## 2022-12-10 NOTE — ED Triage Notes (Signed)
Sore throat and chest pain started 4 days ago. Mid chest. Now feeling pains under left chest

## 2022-12-10 NOTE — ED Notes (Signed)
 RN reviewed discharge instructions with pt. Pt verbalized understanding and had no further questions. VSS upon discharge.  

## 2022-12-10 NOTE — ED Provider Notes (Signed)
[name] EMERGENCY DEPARTMENT AT [facility] Provider Note   CSN: [id number] Arrival date & time: 12/10/22  1201     History  Chief Complaint  Patient presents with   Sore Throat   Chest Pain    Brittney [name] is a 32 y.o. female.  Patient to ED with symptoms for 4 days of sore throat, feeling unwell. She has developed left sided chest pain she describes as "deep" under the breast. No significant cough. She reports she feels it more when she moves or takes a deep breath. No sOB, wheezing. No nausea, vomiting. She has lost her appetite. She also reports right sided low back pain, worse with movement. No radiation, numbness or weakness. No history of recurrent back pain. No known injury.   The history is provided by the patient. No language interpreter was used.  Sore Throat Associated symptoms include chest pain.  Chest Pain      Home Medications Prior to Admission medications   Medication Sig Start Date End Date Taking? Authorizing Provider  doxycycline (VIBRAMYCIN) 100 MG capsule Take 1 capsule (100 mg total) by mouth 2 (two) times daily. 10/18/22   Netta Corrigan, PA-C  ferrous sulfate (FERROUSUL) 325 (65 FE) MG tablet Take 1 tablet (325 mg total) by mouth 2 (two) times daily. Patient not taking: No sig reported 09/20/19   Anyanwu, Jethro Bastos, MD  ibuprofen (ADVIL) 600 MG tablet Take 1 tablet (600 mg total) by mouth every 6 (six) hours. Patient not taking: No sig reported 11/20/19   Aviva Signs, CNM  loratadine (CLARITIN) 10 MG tablet Take 1 tablet (10 mg total) by mouth daily. Patient not taking: No sig reported 06/07/19   Brock Bad, MD  metroNIDAZOLE (METROGEL) 1 % gel Apply topically daily. 07/16/22   Rolm Bookbinder, CNM  Prenatal Vit-Fe Phos-FA-Omega (VITAFOL GUMMIES) 3.33-0.333-34.8 MG CHEW Chew 3 tablets by mouth daily before breakfast. Patient not taking: Reported on 03/31/2020 06/07/19   Brock Bad, MD      Allergies     Amoxicillin and Latex    Review of Systems   Review of Systems  Cardiovascular:  Positive for chest pain.    Physical Exam Updated Vital Signs BP 111/67   Pulse 64   Temp 98.2 F (36.8 C)   Resp 17   LMP  (LMP Unknown)   SpO2 100%  Physical Exam Vitals and nursing note reviewed.  Constitutional:      Appearance: She is well-developed.  HENT:     Nose: Rhinorrhea present.     Mouth/Throat:     Mouth: Mucous membranes are moist.     Pharynx: Oropharynx is clear. Uvula midline. No posterior oropharyngeal erythema.  Cardiovascular:     Rate and Rhythm: Normal rate and regular rhythm.     Heart sounds: No murmur heard. Pulmonary:     Effort: Pulmonary effort is normal.     Breath sounds: No wheezing, rhonchi or rales.     Comments: No redness or swelling of left breast. Nontender.  Chest:     Chest wall: No tenderness.  Abdominal:     General: There is no distension.     Palpations: Abdomen is soft.  Musculoskeletal:        General: Normal range of motion.     Cervical back: Normal range of motion and neck supple.     Comments: No reproducible tenderness of the right paralumbar back. No midline tenderness. No swelling. LE's have FROM with  equal and fell strength bilaterally.   Skin:    General: Skin is warm and dry.  Neurological:     Mental Status: She is alert and oriented to person, place, and time.     Sensory: No sensory deficit.     Deep Tendon Reflexes: Reflexes normal.     ED Results / Procedures / Treatments   Labs (all labs ordered are listed, but only abnormal results are displayed) Labs Reviewed  RESP PANEL BY RT-PCR (RSV, FLU A&B, COVID)  [id number]  GROUP A STREP BY PCR  BASIC METABOLIC PANEL  CBC  PREGNANCY, URINE  TROPONIN I (HIGH SENSITIVITY)  TROPONIN I (HIGH SENSITIVITY)   Results for orders placed or performed during the hospital encounter of 12/10/22  Resp panel by RT-PCR (RSV, Flu A&B, Covid) Anterior Nasal Swab   Specimen: Anterior Nasal  Swab  Result Value Ref Range   SARS Coronavirus 2 by RT PCR NEGATIVE NEGATIVE   Influenza A by PCR NEGATIVE NEGATIVE   Influenza B by PCR NEGATIVE NEGATIVE   Resp Syncytial Virus by PCR NEGATIVE NEGATIVE  Group A Strep by PCR   Specimen: Anterior Nasal Swab; Sterile Swab  Result Value Ref Range   Group A Strep by PCR NOT DETECTED NOT DETECTED  Basic metabolic panel  Result Value Ref Range   Sodium 139 135 - 145 mmol/L   Potassium 3.5 3.5 - 5.1 mmol/L   Chloride 105 98 - 111 mmol/L   CO2 26 22 - 32 mmol/L   Glucose, Bld 86 70 - 99 mg/dL   BUN 8 6 - 20 mg/dL   Creatinine, Ser 2.13 0.44 - 1.00 mg/dL   Calcium 9.8 8.9 - 08.6 mg/dL   GFR, Estimated >57 >84 mL/min   Anion gap 8 5 - 15  CBC  Result Value Ref Range   WBC 8.7 4.0 - 10.5 K/uL   RBC 4.20 3.87 - 5.11 MIL/uL   Hemoglobin 12.5 12.0 - 15.0 g/dL   HCT 69.6 29.5 - 28.4 %   MCV 90.0 80.0 - 100.0 fL   MCH 29.8 26.0 - 34.0 pg   MCHC 33.1 30.0 - 36.0 g/dL   RDW 13.2 44.0 - 10.2 %   Platelets 320 150 - 400 K/uL   nRBC 0.0 0.0 - 0.2 %  Pregnancy, urine  Result Value Ref Range   Preg Test, Ur NEGATIVE NEGATIVE  Troponin I (High Sensitivity)  Result Value Ref Range   Troponin I (High Sensitivity) 2 <18 ng/L    EKG None  Radiology DG Chest 2 View  Result Date: 12/10/2022 CLINICAL DATA:  Pleuritic chest pain for several days. EXAM: CHEST - 2 VIEW COMPARISON:  08/04/2014 FINDINGS: The heart size and mediastinal contours are within normal limits. Both lungs are clear. The visualized skeletal structures are unremarkable. IMPRESSION: Normal exam. Electronically Signed   By: Danae Orleans M.D.   On: 12/10/2022 12:54    Procedures Procedures    Medications Ordered in ED Medications - No data to display  ED Course/ Medical Decision Making/ A&P Clinical Course as of 12/10/22 1446  Sat Dec 10, 2022  1443 Patient with sore throat, loss of appetite, feeling unwell x 4 days, c/o left chest and right low back pain. Labs reviewed.  No evidence of bacterial infection (afebrile, no leukocytosis). Viral panel negative. CXR normal, no infiltrates, edema, effusion, cardiomegaly. Troponin 2. Nonischemic EKG - doubt ACS. Likely viral syndrome requiring supportive care. I have encouraged PCP recheck in one week. Ibuprofen, fluids,  rest.  [SU]    Clinical Course User Index [SU] Elpidio Anis, PA-C                                 Medical Decision Making Amount and/or Complexity of Data Reviewed Labs: ordered. Radiology: ordered.           Final Clinical Impression(s) / ED Diagnoses Final diagnoses:  Viral syndrome  Acute right-sided low back pain without sciatica    Rx / DC Orders ED Discharge Orders     None         Elpidio Anis, PA-C 12/10/22 1446    Ernie Avena, MD 12/10/22 1500

## 2022-12-10 NOTE — Discharge Instructions (Signed)
Follow up with your doctor for recheck in one week to insure you are getting better.   Take ibuprofen 600 mg (3 over-the-counter strength tablets) every 6 hours for aches. Push fluids. Rest.  Return to the ED with any new worsening symptoms at any time.

## 2022-12-21 ENCOUNTER — Ambulatory Visit: Payer: Medicaid Other | Admitting: Family Medicine

## 2022-12-21 NOTE — Progress Notes (Deleted)
    SUBJECTIVE:   CHIEF COMPLAINT / HPI:    ***  PERTINENT  PMH / PSH: ***  OBJECTIVE:   LMP  (LMP Unknown)   ***  ASSESSMENT/PLAN:   Assessment & Plan    Vonna Drafts, MD [facility]

## 2023-03-27 ENCOUNTER — Ambulatory Visit: Payer: Medicaid Other | Admitting: Student

## 2023-03-27 NOTE — Progress Notes (Deleted)
    SUBJECTIVE:   CHIEF COMPLAINT / HPI:   Pap 03/2022 colposcopy results came back indicating a low risk change to her cervix (Low grade squamous intraepithelial lesion). The recommendation is to follow up in one year with a pap smear w/ HPV testing.    Vaginal Discharge: Patient is a 33 y.o. female presenting with vaginal discharge for *** days.  She states the discharge is of *** consistency.  She endorses *** vaginal odor.  She is not *** interested in screening for sexually transmitted infections today. She has contraception with *** {Contraceptives:21111124}. She {DOES NOT does:27190::"does not"} use barrier method consistently.  PERTINENT  PMH / PSH: ***None relevant  OBJECTIVE:   LMP 02/14/2022    General: NAD, pleasant, able to participate in exam Respiratory: Normal effort, no obvious respiratory distress Pelvic: VULVA: normal appearing vulva with no masses, tenderness or lesions, VAGINA: Normal appearing vagina with normal color, no lesions, with {GYN VAGINAL DISCHARGE:21986} discharge present, ***CERVIX: No lesions, {GYN VAGINAL DISCHARGE:21986} discharge present  Chaperone *** CMA present for pelvic exam  ASSESSMENT/PLAN:   No problem-specific Assessment & Plan notes found for this encounter.   Assessment:  33 y.o. female with vaginal discharge for***days, as well as***.  Physical exam significant for*** discharge.  Wet prep performed today shows *** consistent with ***.  Patient is not interested in STI screening.   Plan: -Wet prep as above.  Will treat with***. -Discussed protection during intercourse and contraceptive methods*** -Follow-up as needed  Levin Erp, MD [facility]

## 2023-04-05 ENCOUNTER — Other Ambulatory Visit: Payer: Self-pay

## 2023-04-05 ENCOUNTER — Ambulatory Visit (INDEPENDENT_AMBULATORY_CARE_PROVIDER_SITE_OTHER): Payer: Medicaid Other | Admitting: Student

## 2023-04-05 ENCOUNTER — Encounter: Payer: Self-pay | Admitting: Student

## 2023-04-05 ENCOUNTER — Other Ambulatory Visit (HOSPITAL_COMMUNITY)
Admission: RE | Admit: 2023-04-05 | Discharge: 2023-04-05 | Disposition: A | Discharge status: Home / Self Care | Source: Ambulatory Visit | Attending: Family Medicine | Admitting: Family Medicine

## 2023-04-05 VITALS — BP 106/66 | HR 80 | Ht 63.0 in | Wt 167.0 lb

## 2023-04-05 DIAGNOSIS — Z124 Encounter for screening for malignant neoplasm of cervix: Secondary | ICD-10-CM | POA: Diagnosis present

## 2023-04-05 DIAGNOSIS — Z113 Encounter for screening for infections with a predominantly sexual mode of transmission: Secondary | ICD-10-CM | POA: Diagnosis not present

## 2023-04-05 LAB — POCT WET PREP (WET MOUNT)
Clue Cells Wet Prep Whiff POC: NEGATIVE
Trichomonas Wet Prep HPF POC: ABSENT

## 2023-04-05 NOTE — Progress Notes (Signed)
    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 The recommendation is to follow up in one year with a pap smear w/ HPV testing. Having some mild discharge. Would like STD screenings today  PERTINENT  PMH / PSH: None relevant  OBJECTIVE:   BP 106/66   Pulse 80   Ht 5\' 3"  (1.6 m)   Wt 167 lb (75.8 kg)   LMP 03/24/2022   SpO2 100%   BMI 29.58 kg/m    General: NAD, pleasant, able to participate in exam Respiratory: Normal effort, no obvious respiratory distress Pelvic: VULVA: normal appearing vulva with no masses, tenderness or lesions, VAGINA: Normal appearing vagina with normal color, no lesions, with white discharge present, CERVIX: No lesions, white discharge present  Chaperone Tashira Legette CMA present for pelvic exam  ASSESSMENT/PLAN:   Assessment & Plan Screening for cervical cancer LSIL on colpo 03/2022, here for repeat pap. Wet prep many bacteria otherwise nml. -Pap with HPV cotesting, G/C, trich -Future Hep C, HIV, RPR   Levin Erp, MD [facility]

## 2023-04-05 NOTE — Patient Instructions (Addendum)
 It was great to see you! Thank you for allowing me to participate in your care!   Our plans for today:  - We got your pap smear and wet prep today - Please schedule lab appointment for blood testing!  Take care and seek immediate care sooner if you develop any concerns.  Levin Erp, MD

## 2023-04-06 ENCOUNTER — Other Ambulatory Visit: Payer: Medicaid Other

## 2023-04-06 MED ORDER — METRONIDAZOLE 1 % EX GEL: Freq: Every day | CUTANEOUS | 0 refills | Status: AC

## 2023-04-10 LAB — CYTOLOGY - PAP
Adequacy: ABSENT
Chlamydia: NEGATIVE
Comment: NEGATIVE
Comment: NEGATIVE
Comment: NEGATIVE
Comment: NORMAL
Diagnosis: NEGATIVE
High risk HPV: NEGATIVE
Neisseria Gonorrhea: NEGATIVE
Trichomonas: NEGATIVE

## 2023-07-18 ENCOUNTER — Encounter: Payer: Self-pay | Admitting: *Deleted

## 2023-07-30 DIAGNOSIS — N898 Other specified noninflammatory disorders of vagina: Secondary | ICD-10-CM | POA: Diagnosis not present

## 2023-09-14 ENCOUNTER — Emergency Department (HOSPITAL_BASED_OUTPATIENT_CLINIC_OR_DEPARTMENT_OTHER)
Admission: EM | Admit: 2023-09-14 | Discharge: 2023-09-14 | Disposition: A | Discharge status: Home / Self Care | Attending: Emergency Medicine | Admitting: Emergency Medicine

## 2023-09-14 ENCOUNTER — Encounter (HOSPITAL_BASED_OUTPATIENT_CLINIC_OR_DEPARTMENT_OTHER): Payer: Self-pay | Admitting: Emergency Medicine

## 2023-09-14 ENCOUNTER — Other Ambulatory Visit: Payer: Self-pay

## 2023-09-14 DIAGNOSIS — R110 Nausea: Secondary | ICD-10-CM | POA: Diagnosis not present

## 2023-09-14 DIAGNOSIS — R232 Flushing: Secondary | ICD-10-CM | POA: Diagnosis not present

## 2023-09-14 DIAGNOSIS — R630 Anorexia: Secondary | ICD-10-CM | POA: Insufficient documentation

## 2023-09-14 LAB — URINALYSIS, ROUTINE W REFLEX MICROSCOPIC
Bilirubin Urine: NEGATIVE
Glucose, UA: NEGATIVE mg/dL
Hgb urine dipstick: NEGATIVE
Ketones, ur: NEGATIVE mg/dL
Leukocytes,Ua: NEGATIVE
Nitrite: NEGATIVE
Protein, ur: NEGATIVE mg/dL
Specific Gravity, Urine: 1.028 (ref 1.005–1.030)
pH: 5.5 (ref 5.0–8.0)

## 2023-09-14 LAB — CBC
HCT: 38.6 % (ref 36.0–46.0)
Hemoglobin: 12.9 g/dL (ref 12.0–15.0)
MCH: 29.8 pg (ref 26.0–34.0)
MCHC: 33.4 g/dL (ref 30.0–36.0)
MCV: 89.1 fL (ref 80.0–100.0)
Platelets: 329 K/uL (ref 150–400)
RBC: 4.33 MIL/uL (ref 3.87–5.11)
RDW: 13.1 % (ref 11.5–15.5)
WBC: 6.7 K/uL (ref 4.0–10.5)
nRBC: 0 % (ref 0.0–0.2)

## 2023-09-14 LAB — COMPREHENSIVE METABOLIC PANEL WITH GFR
ALT: 8 U/L (ref 0–44)
AST: 17 U/L (ref 15–41)
Albumin: 4.5 g/dL (ref 3.5–5.0)
Alkaline Phosphatase: 50 U/L (ref 38–126)
Anion gap: 12 (ref 5–15)
BUN: 9 mg/dL (ref 6–20)
CO2: 22 mmol/L (ref 22–32)
Calcium: 10 mg/dL (ref 8.9–10.3)
Chloride: 103 mmol/L (ref 98–111)
Creatinine, Ser: 0.87 mg/dL (ref 0.44–1.00)
GFR, Estimated: >60 mL/min (ref >60)
Glucose, Bld: 98 mg/dL (ref 70–99)
Potassium: 4.3 mmol/L (ref 3.5–5.1)
Sodium: 137 mmol/L (ref 135–145)
Total Bilirubin: 1.1 mg/dL (ref 0.0–1.2)
Total Protein: 8 g/dL (ref 6.5–8.1)

## 2023-09-14 LAB — TSH: TSH: 1.19 u[IU]/mL (ref 0.350–4.500)

## 2023-09-14 LAB — PREGNANCY, URINE: Preg Test, Ur: NEGATIVE

## 2023-09-14 LAB — TROPONIN T, HIGH SENSITIVITY: Troponin T High Sensitivity: <15 ng/L (ref <19)

## 2023-09-14 MED ORDER — ONDANSETRON HCL 4 MG/2ML IJ SOLN
4.0000 mg | Freq: Once | INTRAMUSCULAR | Status: AC
  Administered 2023-09-14: 4 mg via INTRAVENOUS
  Filled 2023-09-14: qty 2

## 2023-09-14 MED ORDER — ONDANSETRON 4 MG PO TBDP: 4.0000 mg | ORAL_TABLET | Freq: Three times a day (TID) | ORAL | 0 refills | Status: DC | PRN

## 2023-09-14 MED ORDER — LACTATED RINGERS IV BOLUS
1000.0000 mL | Freq: Once | INTRAVENOUS | Status: AC
  Administered 2023-09-14: 1000 mL via INTRAVENOUS

## 2023-09-14 NOTE — ED Provider Notes (Signed)
 [place] EMERGENCY DEPARTMENT AT [facility] Provider Note   CSN: [id number] Arrival date & time: 09/14/23  9297     Patient presents with: No chief complaint on file.   Brittney [name] is a 33 y.o. female with no pertinent PMH presents with about a 4-day history of sweats.  Patient reports that she is about 3 to 4 days late on her menstrual cycle.  She says she usually uses her app to track when she is supposed to get her period.  Patient reports over the last few days she has been breaking out in sweats almost like hot flashes without any increased physical activity.  Denies any fevers.  She denies any cough and no productive sputum production.  She states that her throat has also been feeling dry for the last 3 to 4 days and she has been trying to drink more water and juice but feels that her throat is still dry and after hydrating.  Patient also states that she has not been feeling as hungry, even though she does feel like her stomach will growl and tell her that she is hungry.  She has been getting random bouts of nausea but no vomiting or diarrhea.  She says that she does not feel that these are like pregnancy hunger pains she usually gets.  She also endorses feeling a certain sensation when she urinates that similar to when she was pregnant before with her other children.  She denies frequency, she denies tingling or burning.  Patient states that she took a pregnancy test on Monday and she cannot tell if there was a second line to indicate that she was pregnant.   HPI     Prior to Admission medications   Medication Sig Start Date End Date Taking? Authorizing Provider  doxycycline  (VIBRAMYCIN ) 100 MG capsule Take 1 capsule (100 mg total) by mouth 2 (two) times daily. 10/18/22   Victor Lynwood DASEN, PA-C  ferrous sulfate  (FERROUSUL) 325 (65 FE) MG tablet Take 1 tablet (325 mg total) by mouth 2 (two) times daily. Patient not taking: No sig reported 09/20/19   Anyanwu, Gloris LABOR, MD   ibuprofen  (ADVIL ) 600 MG tablet Take 1 tablet (600 mg total) by mouth every 6 (six) hours. Patient not taking: No sig reported 11/20/19   Trudy Earnie CROME, CNM  loratadine  (CLARITIN ) 10 MG tablet Take 1 tablet (10 mg total) by mouth daily. Patient not taking: No sig reported 06/07/19   Rudy Carlin LABOR, MD  metroNIDAZOLE  (METROGEL ) 1 % gel Apply topically daily. 04/06/23   Christia Budds, MD  Prenatal Vit-Fe Phos-FA-Omega (VITAFOL  GUMMIES) 3.33-0.333-34.8 MG CHEW Chew 3 tablets by mouth daily before breakfast. Patient not taking: Reported on 03/31/2020 06/07/19   Rudy Carlin LABOR, MD    Allergies: Amoxicillin  and Latex    Review of Systems Negative unless noted in the HPI Updated Vital Signs BP (!) 136/93   Pulse 94   Temp 98.3 F (36.8 C)   Resp 16   LMP 08/18/2023 (Exact Date)   SpO2 99%   Physical Exam Cardiovascular:     Rate and Rhythm: Normal rate and regular rhythm.  Pulmonary:     Effort: Pulmonary effort is normal.     Breath sounds: Normal breath sounds.  Abdominal:     General: Abdomen is flat.     Palpations: Abdomen is soft.     Tenderness: There is no abdominal tenderness.  Musculoskeletal:     Right lower leg: No edema.  Left lower leg: No edema.  Neurological:     Mental Status: She is alert.  Psychiatric:        Mood and Affect: Mood normal.     (all labs ordered are listed, but only abnormal results are displayed) Labs Reviewed - No data to display  EKG: None  Radiology: No results found.   Procedures   Medications Ordered in the ED - No data to display                                  Medical Decision Making  Differential for sweats include influenza, pneumonia, tuberculosis, menopause, hyperthyroidism, medication induced sweating, hypoglycemia, anxiety and panic disorders, ACS.  Patient has many symptoms that support a wide differential. Patient is hemodynamically stable which is reassuring.  Will rule out any ACS with EKG and  troponin. Will rule out UTI and pregnancy with UA and urine pregnancy test. Due to non specific symptoms will get CMP and CBC and to check for any electrolyte, kidney, liver abnormalities as well as leukocytosis and anemia. Will give patient 1L LR and zofran  for nausea. Will send off TSH as well which is something that can be followed up with primary care physician.   Final assessment: EKG was reassuring against acute ischemic changes and troponin WNL. Pregnancy test was negative and UA did not show any signs of infection. CMP and CBC unremarkable. TSH is pending at this time. We have ruled out any acute emergencies or infections at this time. Patient still could have a viral infection of unknown etiology that should resolve with time. Could also consider other endocrine abnormalities if TSH is WNL. Patient under stood plan for her to follow up with PCP and understands if symptoms worsen she can return to the ED.      Final diagnoses:  None    ED Discharge Orders     None          D'Mello, Deb Loudin, DO 09/14/23 9075    Dreama Longs, MD 09/14/23 [phone]

## 2023-09-14 NOTE — ED Notes (Signed)
 Urine specimen taken to lab.

## 2023-09-14 NOTE — Discharge Instructions (Addendum)
 You came in because you were having sweats and decreased appetite. We made sure that your heart was not under any stress, your electrolytes, kidney and liver function were normal. We also made sure that you did not have a UTI and were not pregnant. We also checked your thyroid  function. This will be a lab that your PCP will need to follow up on. If these symptoms persist, please follow up with your PCP for further workup. We will send you home with some nausea medicine that you should pick up at your pharmacy. Please return to the ED if you have worsening symptoms.

## 2023-09-14 NOTE — ED Notes (Signed)
 Pt given discharge instructions and reviewed prescriptions. Opportunities given for questions. Pt verbalizes understanding. PIV removed x1. Jillyn Hidden, RN

## 2023-09-14 NOTE — ED Triage Notes (Signed)
 Pt arrived POV, caox4, ambulatory stating I think I might be dehydrated, my mouth has been really dry for 3 days and I've been sweating a lot. Pt also reports she is 3 days late on her menstrual cycle. Pt denies fever/chills, vomiting, diarrhea.

## 2024-01-02 ENCOUNTER — Ambulatory Visit: Admitting: Family Medicine

## 2024-01-19 ENCOUNTER — Other Ambulatory Visit (HOSPITAL_COMMUNITY)
Admission: RE | Admit: 2024-01-19 | Discharge: 2024-01-19 | Disposition: A | Source: Ambulatory Visit | Attending: Family Medicine | Admitting: Family Medicine

## 2024-01-19 ENCOUNTER — Encounter (HOSPITAL_COMMUNITY): Payer: Self-pay | Admitting: Obstetrics and Gynecology

## 2024-01-19 ENCOUNTER — Inpatient Hospital Stay (HOSPITAL_COMMUNITY)
Admission: AD | Admit: 2024-01-19 | Discharge: 2024-01-19 | Disposition: A | Discharge status: Home / Self Care | Attending: Obstetrics and Gynecology | Admitting: Obstetrics and Gynecology

## 2024-01-19 ENCOUNTER — Other Ambulatory Visit: Payer: Self-pay

## 2024-01-19 ENCOUNTER — Ambulatory Visit: Admitting: Family Medicine

## 2024-01-19 VITALS — BP 113/69 | HR 66 | Ht 63.0 in | Wt 159.6 lb

## 2024-01-19 DIAGNOSIS — Z3202 Encounter for pregnancy test, result negative: Secondary | ICD-10-CM

## 2024-01-19 DIAGNOSIS — Z3201 Encounter for pregnancy test, result positive: Secondary | ICD-10-CM

## 2024-01-19 DIAGNOSIS — B9689 Other specified bacterial agents as the cause of diseases classified elsewhere: Secondary | ICD-10-CM

## 2024-01-19 DIAGNOSIS — Z113 Encounter for screening for infections with a predominantly sexual mode of transmission: Secondary | ICD-10-CM

## 2024-01-19 LAB — POCT WET PREP (WET MOUNT)
Clue Cells Wet Prep Whiff POC: POSITIVE
Trichomonas Wet Prep HPF POC: ABSENT
WBC, Wet Prep HPF POC: >20

## 2024-01-19 LAB — HCG, QUANTITATIVE, PREGNANCY: hCG, Beta Chain, Quant, S: 4 m[IU]/mL (ref <5)

## 2024-01-19 LAB — POCT URINE PREGNANCY: Preg Test, Ur: NEGATIVE

## 2024-01-19 MED ORDER — METRONIDAZOLE 0.75 % VA GEL: 1.0000 | Freq: Every day | VAGINAL | 0 refills | Status: AC

## 2024-01-19 NOTE — Patient Instructions (Signed)
 It was wonderful to see you today! Thank you for choosing [facility].   Please bring ALL of your medications with you to every visit.   Today we talked about:  I spoke with the provider at the MAU at the hospital and they recommended you go there for evaluation so they can get a serum hCG and consider if you need imaging at that time.  They will likely have follow-up recommendations and if you need additional testing or imaging next week so please follow-up in our clinic as appropriate. The testing was positive for BV, I will send in the MetroGel  that you can use nightly for 5 nights but please discuss this with the MAU provider to ensure it is safe at current time.  Will also follow-up if you are positive for any STIs.  Please follow up next week for additional management   We are checking some labs today. If they are abnormal, I will call you. If they are normal, I will send you a MyChart message (if it is active) or a letter in the mail. If you do not hear about your labs in the next 2 weeks, please call the office.  Call the clinic at [phone] if your symptoms worsen or you have any concerns.  Please be sure to schedule follow up at the front desk before you leave today.   Izetta Nap, DO Family Medicine

## 2024-01-19 NOTE — Progress Notes (Cosign Needed Addendum)
° ° °  SUBJECTIVE:   CHIEF COMPLAINT / HPI:   Discussed the use of AI scribe software for clinical note transcription with the patient, who gave verbal consent to proceed.  Positive pregnancy test - Two home pregnancy tests performed on Tuesday, both positive - Spotting occurred on the same day as testing - Nausea and cramping present, particularly in left lower quadrant - Taking prenatal vitamins - G8 P4, history of ectopic pregnancy previously  Vaginal discharge - Yellow vaginal discharge with fishy odor - No associated itching, burning, dysuria, or pelvic pain with urination - No known STI exposure  PERTINENT  PMH / PSH: Ectopic pregnancy  OBJECTIVE:   BP 113/69   Pulse 66   Ht 5' 3 (1.6 m)   Wt 159 lb 9.6 oz (72.4 kg)   SpO2 100%   BMI 28.27 kg/m    General: NAD, pleasant, able to participate in exam Cardiac: RRR, no murmurs. Respiratory: CTAB, normal effort, No wheezes, rales or rhonchi Abdomen: Bowel sounds present, nontender, nondistended Pelvic exam: normal external genitalia, vulva, vagina, cervix, uterus and adnexa, VAGINA: normal appearing vagina with normal color and discharge, no lesions, vaginal discharge - white, creamy, and bloody, exam chaperoned by [place], CMA  ASSESSMENT/PLAN:   Assessment & Plan Positive pregnancy test G8P4. Negative urine pregnancy in office but tested turned weakly positive right after allotted time.  Possible early pregnancy vs SAB but cannot rule out ectopic pregnancy given mild bleeding upon pelvic exam with lower abdominal cramping in the setting of prior ectopic.  Discussed with MAU provider Erin who recommended evaluation in the MAU for serum hCG and possible TVUS if positive.  Patient instructed to proceed to MAU and follow-up in the clinic next week pending evaluation. Screen for STD (sexually transmitted disease) No known exposures.  Follow-up G/C. BV (bacterial vaginosis) Positive on wet prep, prefers MetroGel  for  treatment. -MetroGel  nightly x 5 nights   Dr. Izetta Nap, DO [facility]

## 2024-01-19 NOTE — MAU Provider Note (Signed)
°  S:   33 y.o. [id number] female who presents from office for pregnancy confirmation. Patient reports positive home pregnancy test on Tuesday. Presented to family medicine today d/t +HPT & vaginal bleeding. History of ectopic pregnancy. UPT in office negative.   O: BP 120/76 (BP Location: Right Arm)   Pulse 67   Temp 97.9 F (36.6 C) (Oral)   Resp 16   Ht 5' 3 (1.6 m)   Wt 70.5 kg   LMP 12/25/2023   SpO2 100%   BMI 27.55 kg/m  Physical Examination: General appearance - alert, well appearing, and in no distress, oriented to person, place, and time and acyanotic, in no respiratory distress  Results for orders placed or performed during the hospital encounter of 01/19/24 (from the past 48 hours)  hCG, quantitative, pregnancy     Status: None   Collection Time: 01/19/24 10:52 AM  Result Value Ref Range   hCG, Beta Chain, Quant, S 4 <5 mIU/mL    Comment:          GEST. AGE      CONC.  (mIU/mL)   <=1 WEEK        5 - 50     2 WEEKS       50 - 500     3 WEEKS       100 - 10,000     4 WEEKS     1,000 - 30,000     5 WEEKS     3,500 - 115,000   6-8 WEEKS     12,000 - 270,000    12 WEEKS     15,000 - 220,000        FEMALE AND NON-PREGNANT FEMALE:     LESS THAN 5 mIU/mL Performed at [facility], [address], [place], [place] [address]     A: 1. Negative pregnancy test    -HCG negative today. Reviewed results with patient. Unsure if false positive test earlier this week vs early pregnancy loss  P: D/C home Reviewed reasons to return to MAU vs her PCP  Rocky Satterfield, NP 7:09 PM

## 2024-01-19 NOTE — MAU Note (Incomplete)
 Brittney [name] is a 33 y.o. at Unknown here in MAU reporting: +HPT on Tuesday. Had STD testing done today and there was vaginal bleeding noted. Hx of ectopic pregnancy. Had faint + preg test in office today. Denies any abdominal pain currently. Reporting yellow vaginal discharge that has a foul odor that started on 01/04/24.   LMP: 12/25/23 Onset of complaint: today  Pain score: 0 There were no vitals filed for this visit.   FHT:n/a Lab orders placed from triage:  bhcg

## 2024-01-19 NOTE — Assessment & Plan Note (Signed)
 Positive on wet prep, prefers MetroGel  for treatment. -MetroGel  nightly x 5 nights

## 2024-01-22 ENCOUNTER — Ambulatory Visit: Payer: Self-pay | Admitting: Family Medicine

## 2024-01-22 LAB — CERVICOVAGINAL ANCILLARY ONLY
Chlamydia: NEGATIVE
Comment: NEGATIVE
Comment: NORMAL
Neisseria Gonorrhea: NEGATIVE

## 2024-01-30 ENCOUNTER — Ambulatory Visit: Admitting: Family Medicine

## 2024-02-07 ENCOUNTER — Ambulatory Visit
Admission: EM | Admit: 2024-02-07 | Discharge: 2024-02-07 | Disposition: A | Discharge status: Home / Self Care | Source: Home / Self Care

## 2024-02-07 ENCOUNTER — Encounter: Payer: Self-pay | Admitting: Emergency Medicine

## 2024-02-07 DIAGNOSIS — J029 Acute pharyngitis, unspecified: Secondary | ICD-10-CM | POA: Diagnosis not present

## 2024-02-07 DIAGNOSIS — R070 Pain in throat: Secondary | ICD-10-CM

## 2024-02-07 LAB — POCT RAPID STREP A (OFFICE): Rapid Strep A Screen: NEGATIVE

## 2024-02-07 NOTE — ED Triage Notes (Addendum)
 Pt reports sore throat and chills x4 days. Denies cough, nasal congestion, fevers, and body aches. No medication use for symptoms. White patches noted on tonsils.

## 2024-02-07 NOTE — ED Provider Notes (Signed)
 " [facility]    CSN: [id number] Arrival date & time: 02/07/24  1825      History   Chief Complaint Chief Complaint  Patient presents with   Sore Throat   Chills    HPI Brittney [name] is a 33 y.o. female.   Pt presents today due to throat pain and chills for 4 days. Pt states that her daughter is getting over something but unsure of what she has. Pt denies fever, nausea, or vomiting. Pt states that she is eating and drinking without issue.   The history is provided by the patient.  Sore Throat    Past Medical History:  Diagnosis Date   [redacted] weeks gestation of pregnancy 10/16/2019   Headache    Maternal iron deficiency anemia complicating pregnancy in third trimester 09/20/2019   Oral iron ordered 09/20/19 CBC Latest Ref Rng & Units 09/19/2019 06/07/2019 05/09/2019 WBC 3.4 - 10.8 x10E3/uL 11.2(H) 10.0 12.1(H) Hemoglobin 11.1 - 15.9 g/dL 10.1(L) 12.1 13.4 Hematocrit 34.0 - 46.6 % 31.3(L) 37.1 39.6 Platelets 150 - 450 x10E3/uL 337 327 388     Precipitous delivery 11/18/2019   Preterm labor     Patient Active Problem List   Diagnosis Date Noted   BV (bacterial vaginosis) 02/13/2020   History of diet controlled gestational diabetes mellitus (GDM) 08/21/2019   ASCUS with positive high risk HPV cervical 07/03/2019   Previous preterm delivery, antepartum 10/23/2017   Birth control counseling 10/11/2016   Eczema 12/23/2014    Past Surgical History:  Procedure Laterality Date   NO PAST SURGERIES      OB History     Gravida  9   Para  4   Term  2   Preterm  2   AB  3   Living  4      SAB  2   IAB      Ectopic  1   Multiple  0   Live Births  4            Home Medications    Prior to Admission medications  Medication Sig Start Date End Date Taking? Authorizing Provider  Prenatal Vit-Fe Fumarate-FA (PRENATAL MULTIVITAMIN) TABS tablet Take 1 tablet by mouth daily at 12 noon.   Yes [provider]  ferrous sulfate  (FERROUSUL)  325 (65 FE) MG tablet Take 1 tablet (325 mg total) by mouth 2 (two) times daily. Patient not taking: No sig reported 09/20/19   Anyanwu, Ugonna A, MD  fluconazole  (DIFLUCAN ) 150 MG tablet 1 tablet Orally one tablet as a single dose; repeat in 7 days if no resolution of symptoms for 10 day(s) Patient not taking: Reported on 02/07/2024    [provider]  metroNIDAZOLE  (FLAGYL ) 500 MG tablet Take 500 mg by mouth. Patient not taking: Reported on 02/07/2024 07/30/23   [provider]  metroNIDAZOLE  (METROGEL ) 0.75 % vaginal gel Place 1 Applicatorful vaginally at bedtime. For 5 days. Patient not taking: Reported on 02/07/2024 01/19/24   Theophilus Pagan, MD  metroNIDAZOLE  (METROGEL ) 1 % gel Apply topically daily. Patient not taking: Reported on 02/07/2024 04/06/23   Christia Budds, MD  Prenatal Vit-Fe Phos-FA-Omega (VITAFOL  GUMMIES) 3.33-0.333-34.8 MG CHEW Chew 3 tablets by mouth daily before breakfast. Patient not taking: Reported on 03/31/2020 06/07/19   Rudy Carlin LABOR, MD    Family History Family History  Problem Relation Age of Onset   Diabetes Other    Hypertension Mother    Hypertension Father    Epilepsy  Sister    Epilepsy Brother    Diabetes Maternal Aunt     Social History Social History[1]   Allergies   Amoxicillin  and Latex   Review of Systems Review of Systems   Physical Exam Triage Vital Signs ED Triage Vitals  Encounter Vitals Group     BP 02/07/24 1848 109/72     Girls Systolic BP Percentile --      Girls Diastolic BP Percentile --      Boys Systolic BP Percentile --      Boys Diastolic BP Percentile --      Pulse Rate 02/07/24 1848 89     Resp 02/07/24 1848 14     Temp 02/07/24 1848 97.8 F (36.6 C)     Temp Source 02/07/24 1848 Oral     SpO2 02/07/24 1848 97 %     Weight --      Height --      Head Circumference --      Peak Flow --      Pain Score 02/07/24 1844 2     Pain Loc --      Pain Education --      Exclude from Growth  Chart --    No data found.  Updated Vital Signs BP 109/72 (BP Location: Left Arm)   Pulse 89   Temp 97.8 F (36.6 C) (Oral)   Resp 14   LMP 01/19/2024 (Exact Date)   SpO2 97%   Breastfeeding No   Visual Acuity Right Eye Distance:   Left Eye Distance:   Bilateral Distance:    Right Eye Near:   Left Eye Near:    Bilateral Near:     Physical Exam Vitals and nursing note reviewed.  Constitutional:      General: She is not in acute distress.    Appearance: Normal appearance. She is not ill-appearing, toxic-appearing or diaphoretic.  HENT:     Mouth/Throat:     Mouth: Mucous membranes are moist.     Pharynx: Oropharynx is clear. No oropharyngeal exudate or posterior oropharyngeal erythema.  Eyes:     General: No scleral icterus. Cardiovascular:     Rate and Rhythm: Normal rate and regular rhythm.     Heart sounds: Normal heart sounds.  Pulmonary:     Effort: Pulmonary effort is normal. No respiratory distress.     Breath sounds: Normal breath sounds. No wheezing or rhonchi.  Musculoskeletal:     Cervical back: No tenderness.  Lymphadenopathy:     Cervical: No cervical adenopathy.  Skin:    General: Skin is warm.  Neurological:     Mental Status: She is alert and oriented to person, place, and time.  Psychiatric:        Mood and Affect: Mood normal.        Behavior: Behavior normal.      UC Treatments / Results  Labs (all labs ordered are listed, but only abnormal results are displayed) Labs Reviewed  POCT RAPID STREP A (OFFICE) - Normal    EKG   Radiology No results found.  Procedures Procedures (including critical care time)  Medications Ordered in UC Medications - No data to display  Initial Impression / Assessment and Plan / UC Course  I have reviewed the triage vital signs and the nursing notes.  Pertinent labs & imaging results that were available during my care of the patient were reviewed by me and considered in my medical decision making  (see chart for details).  Final Clinical Impressions(s) / UC Diagnoses   Final diagnoses:  Throat pain  Acute pharyngitis, unspecified etiology     Discharge Instructions      You been diagnosed with a viral illness today. -Viruses have to run their course and medicines that are prescribed are meant to help with symptoms. - With viruses usually feel poorly from 3 to 7 days with cough being the last symptoms to resolve.  -Cough can linger from days to weeks.  Antibiotics are not effective for viruses. -If your cough lasts more than 2 weeks and you are coughing so hard that you are vomiting or feel like you could pass out we need to follow-up with PCP for further testing and evaluation. -Rest, increase water intake, may use pseudoephedrine  for nasal congestion, Delsym (dextromethorphan) or honey as needed for cough, and ibuprofen  and/or Tylenol  as directed on packaging for pain and fever. -If you have hypertension you should take Coricidin or other OTC meds approved for people with high blood pressure. -You may use a spoonful of honey every 4-6 hours as needed for throat pain and cough. -Warm tea with honey and lemon are helpful for soothe throat as well.  Chloraseptic and Cepacol make a throat lozenge with numbing medication, can be purchased over-the-counter. -May also use Flonase or sinus rinse for sinus pressure or nasal congestion.  Be sure to use distilled bottled water for sinus rinses. -May use coolmist humidifier to open up nasal passages -May elevate head to assist with postnasal drainage. -If you feel poorly (fever, fatigue, shortness of breath, nausea, etc.) for more than 10 days to be sure to follow-up with PCP or in clinic for further evaluation and additional treatments. If you experience chest pain with shortness of breath or pulse oxygen less than 95% you should report to the ER.    ED Prescriptions   None    PDMP not reviewed this encounter.    [1]  Social  History Tobacco Use   Smoking status: Never    Passive exposure: Never   Smokeless tobacco: Never  Vaping Use   Vaping status: Never Used  Substance Use Topics   Alcohol use: No    Alcohol/week: 0.0 standard drinks of alcohol   Drug use: No     Andra Corean JAYSON DEVONNA 02/07/24 1921  "

## 2024-02-07 NOTE — Discharge Instructions (Signed)
